# Patient Record
Sex: Female | Born: 1940
Health system: Southern US, Community
[De-identification: ages and names within clinical notes are randomized; demographics above are authoritative.]

## PROBLEM LIST (undated history)

## (undated) ENCOUNTER — Emergency Department (HOSPITAL_COMMUNITY): Admission: EM | Payer: Self-pay | Source: Home / Self Care

## (undated) DIAGNOSIS — E119 Type 2 diabetes mellitus without complications: Secondary | ICD-10-CM

## (undated) DIAGNOSIS — I340 Nonrheumatic mitral (valve) insufficiency: Secondary | ICD-10-CM

## (undated) DIAGNOSIS — I10 Essential (primary) hypertension: Secondary | ICD-10-CM

## (undated) DIAGNOSIS — I639 Cerebral infarction, unspecified: Secondary | ICD-10-CM

## (undated) HISTORY — PX: BLADDER SURGERY: SHX569

## (undated) HISTORY — PX: MOLE REMOVAL: SHX2046

## (undated) HISTORY — PX: OTHER SURGICAL HISTORY: SHX169

---

## 1998-03-31 ENCOUNTER — Emergency Department (HOSPITAL_COMMUNITY): Admission: EM | Admit: 1998-03-31 | Discharge: 1998-03-31 | Payer: Self-pay

## 1998-09-06 ENCOUNTER — Encounter: Payer: Self-pay | Admitting: Emergency Medicine

## 1998-09-06 ENCOUNTER — Emergency Department (HOSPITAL_COMMUNITY): Admission: EM | Admit: 1998-09-06 | Discharge: 1998-09-06 | Payer: Self-pay | Admitting: Emergency Medicine

## 2002-03-11 ENCOUNTER — Emergency Department (HOSPITAL_COMMUNITY): Admission: EM | Admit: 2002-03-11 | Discharge: 2002-03-11 | Payer: Self-pay | Admitting: Emergency Medicine

## 2002-03-11 ENCOUNTER — Encounter: Payer: Self-pay | Admitting: Emergency Medicine

## 2002-05-06 ENCOUNTER — Encounter: Payer: Self-pay | Admitting: Emergency Medicine

## 2002-05-06 ENCOUNTER — Emergency Department (HOSPITAL_COMMUNITY): Admission: EM | Admit: 2002-05-06 | Discharge: 2002-05-06 | Payer: Self-pay | Admitting: *Deleted

## 2010-06-27 ENCOUNTER — Emergency Department (HOSPITAL_COMMUNITY): Admission: EM | Admit: 2010-06-27 | Discharge: 2010-06-27 | Payer: Self-pay | Admitting: Family Medicine

## 2010-10-31 LAB — POCT URINALYSIS DIPSTICK
Bilirubin Urine: NEGATIVE
Glucose, UA: NEGATIVE mg/dL
Hgb urine dipstick: NEGATIVE
Ketones, ur: NEGATIVE mg/dL
Nitrite: NEGATIVE
Protein, ur: NEGATIVE mg/dL
Specific Gravity, Urine: 1.01 (ref 1.005–1.030)
Urobilinogen, UA: 0.2 mg/dL (ref 0.0–1.0)
pH: 5 (ref 5.0–8.0)

## 2011-02-08 ENCOUNTER — Emergency Department (HOSPITAL_COMMUNITY)
Admission: EM | Admit: 2011-02-08 | Discharge: 2011-02-08 | Disposition: A | Discharge status: Home / Self Care | Payer: Medicare Other | Attending: Emergency Medicine | Admitting: Emergency Medicine

## 2011-02-08 DIAGNOSIS — R51 Headache: Secondary | ICD-10-CM | POA: Insufficient documentation

## 2011-02-08 DIAGNOSIS — M25429 Effusion, unspecified elbow: Secondary | ICD-10-CM | POA: Insufficient documentation

## 2011-02-08 DIAGNOSIS — T63001A Toxic effect of unspecified snake venom, accidental (unintentional), initial encounter: Secondary | ICD-10-CM | POA: Insufficient documentation

## 2011-02-08 DIAGNOSIS — I10 Essential (primary) hypertension: Secondary | ICD-10-CM | POA: Insufficient documentation

## 2011-02-08 DIAGNOSIS — I252 Old myocardial infarction: Secondary | ICD-10-CM | POA: Insufficient documentation

## 2011-02-08 DIAGNOSIS — E119 Type 2 diabetes mellitus without complications: Secondary | ICD-10-CM | POA: Insufficient documentation

## 2011-02-08 DIAGNOSIS — R350 Frequency of micturition: Secondary | ICD-10-CM | POA: Insufficient documentation

## 2011-02-08 DIAGNOSIS — T6391XA Toxic effect of contact with unspecified venomous animal, accidental (unintentional), initial encounter: Secondary | ICD-10-CM | POA: Insufficient documentation

## 2011-02-08 LAB — POCT I-STAT, CHEM 8
BUN: 14 mg/dL (ref 6–23)
Calcium, Ion: 1.17 mmol/L (ref 1.12–1.32)
Chloride: 107 mEq/L (ref 96–112)
Creatinine, Ser: 1.1 mg/dL (ref 0.50–1.10)
Glucose, Bld: 155 mg/dL — ABNORMAL HIGH (ref 70–99)
HCT: 44 % (ref 36.0–46.0)
Hemoglobin: 15 g/dL (ref 12.0–15.0)
Potassium: 3.6 mEq/L (ref 3.5–5.1)
Sodium: 138 mEq/L (ref 135–145)
TCO2: 24 mmol/L (ref 0–100)

## 2011-02-08 LAB — URINALYSIS, ROUTINE W REFLEX MICROSCOPIC
Glucose, UA: NEGATIVE mg/dL
Leukocytes, UA: NEGATIVE
pH: 6 (ref 5.0–8.0)

## 2012-08-17 ENCOUNTER — Other Ambulatory Visit (HOSPITAL_COMMUNITY)
Admission: RE | Admit: 2012-08-17 | Discharge: 2012-08-17 | Disposition: A | Discharge status: Home / Self Care | Payer: Medicare Other | Source: Ambulatory Visit | Attending: Emergency Medicine | Admitting: Emergency Medicine

## 2012-08-17 ENCOUNTER — Emergency Department (INDEPENDENT_AMBULATORY_CARE_PROVIDER_SITE_OTHER): Payer: Medicare Other

## 2012-08-17 ENCOUNTER — Encounter (HOSPITAL_COMMUNITY): Payer: Self-pay | Admitting: *Deleted

## 2012-08-17 ENCOUNTER — Emergency Department (INDEPENDENT_AMBULATORY_CARE_PROVIDER_SITE_OTHER)
Admission: EM | Admit: 2012-08-17 | Discharge: 2012-08-17 | Disposition: A | Discharge status: Home / Self Care | Payer: Medicare Other | Source: Home / Self Care | Attending: Emergency Medicine | Admitting: Emergency Medicine

## 2012-08-17 DIAGNOSIS — N760 Acute vaginitis: Secondary | ICD-10-CM | POA: Insufficient documentation

## 2012-08-17 DIAGNOSIS — J209 Acute bronchitis, unspecified: Secondary | ICD-10-CM

## 2012-08-17 DIAGNOSIS — E119 Type 2 diabetes mellitus without complications: Secondary | ICD-10-CM

## 2012-08-17 DIAGNOSIS — N390 Urinary tract infection, site not specified: Secondary | ICD-10-CM

## 2012-08-17 DIAGNOSIS — Z113 Encounter for screening for infections with a predominantly sexual mode of transmission: Secondary | ICD-10-CM | POA: Insufficient documentation

## 2012-08-17 HISTORY — DX: Essential (primary) hypertension: I10

## 2012-08-17 HISTORY — DX: Type 2 diabetes mellitus without complications: E11.9

## 2012-08-17 LAB — POCT I-STAT, CHEM 8
Calcium, Ion: 1.11 mmol/L — ABNORMAL LOW (ref 1.13–1.30)
Chloride: 104 mEq/L (ref 96–112)
Creatinine, Ser: 1.2 mg/dL — ABNORMAL HIGH (ref 0.50–1.10)
Glucose, Bld: 184 mg/dL — ABNORMAL HIGH (ref 70–99)
HCT: 44 % (ref 36.0–46.0)

## 2012-08-17 LAB — POCT URINALYSIS DIP (DEVICE)
Bilirubin Urine: NEGATIVE
Nitrite: NEGATIVE
Urobilinogen, UA: 0.2 mg/dL (ref 0.0–1.0)
pH: 5.5 (ref 5.0–8.0)

## 2012-08-17 MED ORDER — METFORMIN HCL 500 MG PO TABS: 500.0000 mg | ORAL_TABLET | Freq: Every day | ORAL | Status: DC

## 2012-08-17 MED ORDER — BENZONATATE 200 MG PO CAPS: 200.0000 mg | ORAL_CAPSULE | Freq: Three times a day (TID) | ORAL | Status: DC | PRN

## 2012-08-17 MED ORDER — CEPHALEXIN 500 MG PO CAPS: 500.0000 mg | ORAL_CAPSULE | Freq: Three times a day (TID) | ORAL | Status: DC

## 2012-08-17 MED ORDER — METRONIDAZOLE 500 MG PO TABS: 500.0000 mg | ORAL_TABLET | Freq: Two times a day (BID) | ORAL | Status: DC

## 2012-08-17 NOTE — ED Provider Notes (Signed)
Chief Complaint  Patient presents with  . Cough    History of Present Illness:   Donna Nguyen is a 71 year old female who has no primary care provider. She comes in today with a number of problems including the following: Vaginal odor, chest discomfort, fever, cough, back pain, some foot problems, and urinary frequency and stress incontinence. Her vaginal symptoms when going on about 3 or 4 weeks. She denies any discharge. She has had some odor. She's had some lower abdominal pain. She denies any bleeding. The chest pain has been going on since this past Monday, one week. She feels a tight pressure in the substernal area. This is constant and worse if she takes a deep breath. She states she had an heart attack in 2009 but has not been back to see a cardiologist since then. Also for the past week she's had temperatures up to 101 to 102. She has difficulty breathing and rattling in her chest. She's had a dry, nonproductive cough, sore throat, ear congestion, and headache. She's also had pain in her lower back. She cut her right foot about a week ago and wonders whether it's healing up okay. She also has a crack at the base of the left little toe for about 3 years. She's been putting Vaseline on this and she thinks it might be healing up well. Since she's had a cough she's had some stress incontinence and urinary frequency. She has a history of diabetes, hypertension, and hyperlipidemia but is not taking any medicine for any of these right now. She denies any diabetic symptoms including polyuria, polydipsia, or blurry vision.  Review of Systems:  Other than noted above, the patient denies any of the following symptoms. Systemic:  No fever, chills, sweats, fatigue, myalgias, headache, or anorexia. Eye:  No redness, pain or drainage. ENT:  No earache, nasal congestion, rhinorrhea, sinus pressure, or sore throat. Lungs:  No cough, sputum production, wheezing, shortness of breath.  Cardiovascular:  No chest  pain, palpitations, or syncope. GI:  No nausea, vomiting, abdominal pain or diarrhea. GU:  No dysuria, frequency, or hematuria. Skin:  No rash or pruritis.  PMFSH:  Past medical history, family history, social history, meds, and allergies were reviewed.   Physical Exam:   Vital signs:  BP 120/80  Pulse 96  Temp 98.4 F (36.9 C) (Oral)  Resp 20  SpO2 97% General:  Alert, in no distress. Eye:  PERRL, full EOMs.  Lids and conjunctivas were normal. ENT:  TMs and canals were normal, without erythema or inflammation.  Nasal mucosa was clear and uncongested, without drainage.  Mucous membranes were moist.  Pharynx was clear, without exudate or drainage.  There were no oral ulcerations or lesions. Neck:  Supple, no adenopathy, tenderness or mass. Thyroid was normal. Lungs:  No respiratory distress.  Lungs were clear to auscultation, without wheezes, rales or rhonchi.  Breath sounds were clear and equal bilaterally. Heart:  Regular rhythm, without gallops, murmers or rubs. Abdomen:  Soft, flat, and non-tender to palpation.  No hepatosplenomagaly or mass. Pelvic exam: Normal external genitalia. Vaginal mucosa was atrophic. There was no discharge or bleeding. Cervix was normal. Uterus was small atrophic. There were no adnexal masses or tenderness. Skin:  Clear, warm, and dry, without rash or lesions.  Labs:   Results for orders placed during the hospital encounter of 08/17/12  POCT URINALYSIS DIP (DEVICE)      Component Value Range   Glucose, UA 500 (*) NEGATIVE mg/dL   Bilirubin  Urine NEGATIVE  NEGATIVE   Ketones, ur NEGATIVE  NEGATIVE mg/dL   Specific Gravity, Urine 1.020  1.005 - 1.030   Hgb urine dipstick SMALL (*) NEGATIVE   pH 5.5  5.0 - 8.0   Protein, ur NEGATIVE  NEGATIVE mg/dL   Urobilinogen, UA 0.2  0.0 - 1.0 mg/dL   Nitrite NEGATIVE  NEGATIVE   Leukocytes, UA NEGATIVE  NEGATIVE  POCT I-STAT, CHEM 8      Component Value Range   Sodium 139  135 - 145 mEq/L   Potassium 3.6  3.5  - 5.1 mEq/L   Chloride 104  96 - 112 mEq/L   BUN 14  6 - 23 mg/dL   Creatinine, Ser 1.61 (*) 0.50 - 1.10 mg/dL   Glucose, Bld 096 (*) 70 - 99 mg/dL   Calcium, Ion 0.45 (*) 1.13 - 1.30 mmol/L   TCO2 27  0 - 100 mmol/L   Hemoglobin 15.0  12.0 - 15.0 g/dL   HCT 40.9  81.1 - 91.4 %     Radiology:  Dg Chest 2 View  08/17/2012  *RADIOLOGY REPORT*  Clinical Data: Cough, fever.  CHEST - 2 VIEW  Comparison: None.  Findings: Mild hyperinflation of the lungs.  Minimal lingular density, likely scar.  Heart is upper limits normal in size. Mediastinal contours are within normal limits.  No effusions.  No acute bony abnormality.  IMPRESSION: COPD/chronic changes.  No acute findings.   Original Report Authenticated By: Charlett Nose, M.D.     Date: 08/17/2012  Rate: 103  Rhythm: sinus tachycardia  QRS Axis: normal  Intervals: normal  ST/T Wave abnormalities: normal  Conduction Disutrbances:none  Narrative Interpretation: Sinus tachycardia, otherwise normal EKG.  Old EKG Reviewed: none available  Other Labs Obtained at Urgent Care Center:  DNA probes for gonorrhea, Chlamydia, Trichomonas, candida, and Gardnerella were obtained. Also a urine culture was obtained.  Results are pending at this time and we will call about any positive results.   Assessment:  The primary encounter diagnosis was Vaginitis. Diagnoses of Acute bronchitis, Type 2 diabetes mellitus, and UTI (lower urinary tract infection) were also pertinent to this visit.  The vaginitis could be atrophic vaginitis or bacterial vaginosis. The chest discomfort appears to be due to cough and noncardiac in nature. She may have urinary tract infection. Results of her culture are pending. Her blood sugar was elevated and I went ahead and start her on some metronidazole, but she was strongly encouraged to find a primary care physician and a cardiologist and followup as soon as possible with both.  Plan:   1.  The following meds were prescribed:   New  Prescriptions   BENZONATATE (TESSALON) 200 MG CAPSULE    Take 1 capsule (200 mg total) by mouth 3 (three) times daily as needed for cough.   CEPHALEXIN (KEFLEX) 500 MG CAPSULE    Take 1 capsule (500 mg total) by mouth 3 (three) times daily.   METFORMIN (GLUCOPHAGE) 500 MG TABLET    Take 1 tablet (500 mg total) by mouth daily with breakfast.   METRONIDAZOLE (FLAGYL) 500 MG TABLET    Take 1 tablet (500 mg total) by mouth 2 (two) times daily.   2.  The patient was instructed in symptomatic care and handouts were given. 3.  The patient was told to return if becoming worse in any way, if no better in 3 or 4 days, and given some red flag symptoms that would indicate earlier return.    Onalee Hua  Vivia Budge, MD 08/17/12 2002

## 2012-08-17 NOTE — ED Notes (Signed)
Pt  Has  Multiple  Complaints    She  Reports  She  Has  A   Non  Productive  Cough       With  Headache         As  Well   As  Loss of  Bladder  Control  As  Well           She  Reports    Had  Vaginal irritation  Recently  From  Tight    Jeans       She  Reports  frequency  Of  Urination as  Well

## 2012-08-18 LAB — URINE CULTURE: Colony Count: NO GROWTH

## 2012-08-22 NOTE — ED Notes (Signed)
GC/Chlamydia neg., Affirm: all neg. Urine culture: No growth.

## 2012-11-07 ENCOUNTER — Emergency Department (INDEPENDENT_AMBULATORY_CARE_PROVIDER_SITE_OTHER)
Admission: EM | Admit: 2012-11-07 | Discharge: 2012-11-07 | Disposition: A | Discharge status: Home / Self Care | Payer: Medicare Other | Source: Home / Self Care | Attending: Family Medicine | Admitting: Family Medicine

## 2012-11-07 ENCOUNTER — Encounter (HOSPITAL_COMMUNITY): Payer: Self-pay | Admitting: Emergency Medicine

## 2012-11-07 DIAGNOSIS — W010XXA Fall on same level from slipping, tripping and stumbling without subsequent striking against object, initial encounter: Secondary | ICD-10-CM

## 2012-11-07 DIAGNOSIS — S0081XA Abrasion of other part of head, initial encounter: Secondary | ICD-10-CM

## 2012-11-07 DIAGNOSIS — IMO0002 Reserved for concepts with insufficient information to code with codable children: Secondary | ICD-10-CM

## 2012-11-07 DIAGNOSIS — S39012A Strain of muscle, fascia and tendon of lower back, initial encounter: Secondary | ICD-10-CM

## 2012-11-07 DIAGNOSIS — S335XXA Sprain of ligaments of lumbar spine, initial encounter: Secondary | ICD-10-CM

## 2012-11-07 DIAGNOSIS — E119 Type 2 diabetes mellitus without complications: Secondary | ICD-10-CM

## 2012-11-07 MED ORDER — ACETAMINOPHEN 650 MG PO TABS: 1.0000 | ORAL_TABLET | Freq: Three times a day (TID) | ORAL | Status: DC | PRN

## 2012-11-07 MED ORDER — CYCLOBENZAPRINE HCL 10 MG PO TABS
10.0000 mg | ORAL_TABLET | Freq: Every evening | ORAL | Status: DC | PRN
Start: 2012-11-07 — End: 2015-03-14

## 2012-11-07 MED ORDER — FLUTICASONE PROPIONATE 50 MCG/ACT NA SUSP: 2.0000 | Freq: Every day | NASAL | Status: DC

## 2012-11-07 NOTE — ED Notes (Signed)
Instructions not available 

## 2012-11-07 NOTE — ED Notes (Signed)
Received instructions 

## 2012-11-07 NOTE — ED Notes (Signed)
Patient sitting in chair.  Provided warm blankets.  Continues to keep ice pack on right ankle, scrapes to bridge of nose and right lower leg. Patient is appropriate, talkative, answering questions appropriately.  Reports head and nose was the most painful initially, now ankle is quite painful.  No loc.  Remembers falling.  Walking on stepping stones, stepping stone tilted throwing patient off balance.  Twisted ankle, fell to knee and then hit head and face.

## 2012-11-07 NOTE — ED Notes (Addendum)
Pt c/o of fall this morning. Was walking dog and tripped over a stepping stone. Twisted ankle and fell on knee then hit head. Right side is sore and back hurts. Abrasions on right knee and shin, also on bridge of nose. Has no dizziness but does feel nauseous. Drinking a milkshake for nausea with mild relief. Felt like nose was "tightening". Curently using two ice packs for back and nose with relief. Pt is alert and slightly disoriented. Pt drove herself to facility today. Took Aleeve and Prilosec this morning.

## 2012-11-07 NOTE — ED Provider Notes (Addendum)
History     CSN: [id number]  Arrival date & time 11/07/12  1150   First MD Initiated Contact with Patient 11/07/12 1219      Chief Complaint  Patient presents with  . Fall    (Consider location/radiation/quality/duration/timing/severity/associated sxs/prior treatment) HPI Comments: 72 year old female with history of hypertension and diabetes. Here concerned after she had a fall at 10 AM this morning (over 4 hours ago). Patient reports that she was walking her dog outside of her house and she tripped over a stepping stone falling forward she was able to more ties her fall with her upper extremities but eventually keep her forehead and nose against the ground. She has an abrasion at the bridge of her nose, no bruising. Patient reports she initially had a headache but is almost completely gone now. Denies blurry vision palpitations, seizures or altered mentation before or after her fall. She denies current problems with gait or balance. No nausea or vomiting. Patient does report pain in her lower back as she thinks she over stretched her back during her fall. Patient also wanted to have address a chronic symptoms of nasal congestion, sneezing and clear rhinorrhea for months. Chills reports nonproductive cough with his symptoms. Denies fevers chills. No sore throat. No wheezing here no prior or current history of smoking. Patient stated she is diabetic and hypertensive she is not currently taking medications. She does not like doctors Chaney Malling does not like to take pills. States she has been well controlled on diet and exercise and she checks her sugar daily at home usually below 200 randomly.    Past Medical History  Diagnosis Date  . Diabetes mellitus without complication   . Hypertension     Past Surgical History  Procedure Laterality Date  . Bladdee    . Bladder surgery    . Mole removal      No family history on file.  History  Substance Use Topics  . Smoking status: Never Smoker    . Smokeless tobacco: Not on file  . Alcohol Use: Yes     Comment: socially    OB History   Grav Para Term Preterm Abortions TAB SAB Ect Mult Living                  Review of Systems  Constitutional: Negative for activity change and appetite change.  HENT: Positive for congestion and sneezing. Negative for nosebleeds, facial swelling, neck stiffness and ear discharge.   Eyes: Negative for visual disturbance.  Musculoskeletal: Positive for back pain. Negative for joint swelling and gait problem.  Skin: Negative for wound.       Nose abrasion as per HPI  Neurological: Negative for dizziness, tremors, seizures, syncope, weakness, light-headedness, numbness and headaches.  All other systems reviewed and are negative.    Allergies  Amitriptyline; Ciprofloxacin; and Epinephrine  Home Medications   Current Outpatient Rx  Name  Route  Sig  Dispense  Refill  . Naproxen Sodium (ALEVE PO)   Oral   Take by mouth.         . Omeprazole (PRILOSEC PO)   Oral   Take by mouth.         . Acetaminophen 650 MG TABS   Oral   Take 1 tablet (650 mg total) by mouth 3 (three) times daily as needed.   30 tablet   0   . benzonatate (TESSALON) 200 MG capsule   Oral   Take 1 capsule (200 mg total) by mouth  3 (three) times daily as needed for cough.   30 capsule   0   . cyclobenzaprine (FLEXERIL) 10 MG tablet   Oral   Take 1 tablet (10 mg total) by mouth at bedtime as needed for muscle spasms.   15 tablet   0   . fluticasone (FLONASE) 50 MCG/ACT nasal spray   Nasal   Place 2 sprays into the nose daily.   16 g   0   . metFORMIN (GLUCOPHAGE) 500 MG tablet   Oral   Take 1 tablet (500 mg total) by mouth daily with breakfast.   30 tablet   2     BP 155/78  Pulse 76  Temp(Src) 97.9 F (36.6 C) (Oral)  SpO2 95%  Physical Exam  Nursing note and vitals reviewed. Constitutional: She is oriented to person, place, and time. She appears well-developed and well-nourished. No  distress.  HENT:  Head: Normocephalic.  Right Ear: External ear normal.  Left Ear: External ear normal.  Mouth/Throat: No oropharyngeal exudate.  No head contusion, bruising, ecchymosis, hematoma or lacerations.  There is a vertical superficial abrasion over nasal bridge with no associated swelling, hematoma or bleeding.  There is swelling of nasal turbinates with clear rhinorrhea.   Eyes: Conjunctivae and EOM are normal. Pupils are equal, round, and reactive to light.  Neck: Normal range of motion. Neck supple.  Cardiovascular: Normal rate, regular rhythm and normal heart sounds.   Pulmonary/Chest: Effort normal and breath sounds normal.  Musculoskeletal:  Central spine with no scoliosis or kyphosis. Fair anterior flexion and posterior extension. Patient able to walk on tip toes and heels with no difficulty foot drop or pain exacerbation. No bone prominence tenderness. Tenderness to palpation in bilateral lumbar paravertebral muscles.  Negative straight leg test bilateral. Intact sensation and symmetric + DTRs (rotullian and achillean) in low extremities.   Neurological: She is alert and oriented to person, place, and time. She has normal strength and normal reflexes. No cranial nerve deficit or sensory deficit. She displays a negative Romberg sign. Coordination and gait normal. GCS eye subscore is 4. GCS verbal subscore is 5. GCS motor subscore is 6.  No face drop. No arm drop.  Visual fields normal by comparison.  Skin: She is not diaphoretic.  No bruising , ecchymosis, hematomas or lacerations.    ED Course  Procedures (including critical care time)  Labs Reviewed - No data to display No results found.   1. Low back strain, initial encounter   2. Facial abrasion, initial encounter   3. Fall from slipping on slippery surface, initial encounter       MDM  No history or clinical findings concerning for concussion or intracranial injury. Provided antibiotic ointment to  treat superficial abrasion over nasal bridge. Prescribed acetaminophen and bedtime Flexeril to take as needed for pain and muscle spasm. Chronic rhinitis treated with Flonase. Recommended patient to establish care with a primary care provider to monitor her symptoms and chronic conditions. She appeared Contemplative. A list of primary care provider in our area was given prior to discharge. Red flags that should prompt her return to medical attention discussed with patient and provided in writing.        Sharin Grave, MD 11/08/12 1342  Sharin Grave, MD 11/08/12 1359

## 2013-09-12 ENCOUNTER — Encounter (HOSPITAL_COMMUNITY): Payer: Self-pay | Admitting: Emergency Medicine

## 2013-09-12 ENCOUNTER — Emergency Department (HOSPITAL_COMMUNITY)
Admission: EM | Admit: 2013-09-12 | Discharge: 2013-09-12 | Disposition: A | Discharge status: Home / Self Care | Payer: Medicare Other | Source: Home / Self Care | Attending: Emergency Medicine | Admitting: Emergency Medicine

## 2013-09-12 ENCOUNTER — Emergency Department (INDEPENDENT_AMBULATORY_CARE_PROVIDER_SITE_OTHER): Payer: Medicare Other

## 2013-09-12 DIAGNOSIS — J209 Acute bronchitis, unspecified: Secondary | ICD-10-CM

## 2013-09-12 MED ORDER — AZITHROMYCIN 250 MG PO TABS: ORAL_TABLET | ORAL | Status: DC

## 2013-09-12 MED ORDER — ALBUTEROL SULFATE HFA 108 (90 BASE) MCG/ACT IN AERS: 1.0000 | INHALATION_SPRAY | Freq: Four times a day (QID) | RESPIRATORY_TRACT | Status: DC | PRN

## 2013-09-12 MED ORDER — GUAIFENESIN-CODEINE 100-10 MG/5ML PO SYRP: 10.0000 mL | ORAL_SOLUTION | Freq: Four times a day (QID) | ORAL | Status: DC | PRN

## 2013-09-12 NOTE — ED Notes (Signed)
Pt c/o cold sxs onset Monday  Sxs include: cough, chest rattling, fevers, congestion Denies: v/n/d, SOB, wheezing... Taking "hot toddie" w/no relief Alert w/no signs of acute distress.

## 2013-09-12 NOTE — ED Provider Notes (Signed)
Chief Complaint   Chief Complaint  Patient presents with  . URI    History of Present Illness   Donna Nguyen is a 73 year old female who has had a one-week history of dry cough, rattling in her chest, chest pain, subjective fever, sweats, chills, nasal congestion with clear drainage, congested ears, and sore throat. She denies any wheezing, chest tightness, headache, or GI symptoms. She's had no specific sick exposures.  Review of Systems   Other than as noted above, the patient denies any of the following symptoms: Systemic:  No fevers, chills, sweats, or myalgias. Eye:  No redness or discharge. ENT:  No ear pain, headache, nasal congestion, drainage, sinus pressure, or sore throat. Neck:  No neck pain, stiffness, or swollen glands. Lungs:  No cough, sputum production, hemoptysis, wheezing, chest tightness, shortness of breath or chest pain. GI:  No abdominal pain, nausea, vomiting or diarrhea.  [place]   Past medical history, family history, social history, meds, and allergies were reviewed. She is allergic to amitriptyline. She takes Prilosec and Aleve. She has diabetes, coronary artery disease, history of an MI, and hypertension. She's not on any medications for these problems.  Physical exam   Vital signs:  BP 171/81  Pulse 105  Temp(Src) 98.4 F (36.9 C) (Oral)  Resp 20  SpO2 96% General:  Alert and oriented.  In no distress.  Skin warm and dry. Eye:  No conjunctival injection or drainage. Lids were normal. ENT:  TMs and canals were normal, without erythema or inflammation.  Nasal mucosa was clear and uncongested, without drainage.  Mucous membranes were moist.  Pharynx was clear with no exudate or drainage.  There were no oral ulcerations or lesions. Neck:  Supple, no adenopathy, tenderness or mass. Lungs:  No respiratory distress.  Lungs were clear to auscultation, without wheezes, rales or rhonchi.  Breath sounds were clear and equal bilaterally.  Heart:  Regular  rhythm, without gallops, murmers or rubs. Skin:  Clear, warm, and dry, without rash or lesions.  Radiology   Dg Chest 2 View  09/12/2013   CLINICAL DATA:  Cough and fever  EXAM: CHEST  2 VIEW  COMPARISON:  08/17/2012  FINDINGS: Upper normal heart size. Clear lungs. No pneumothorax or pleural effusion.  IMPRESSION: No active cardiopulmonary disease.   Electronically Signed   By: Maryclare Bean M.D.   On: 09/12/2013 10:12   Assessment     The encounter diagnosis was Acute bronchitis.  Plan    1.  Meds:  The following meds were prescribed:   New Prescriptions   ALBUTEROL (PROVENTIL HFA;VENTOLIN HFA) 108 (90 BASE) MCG/ACT INHALER    Inhale 1-2 puffs into the lungs every 6 (six) hours as needed for wheezing or shortness of breath.   AZITHROMYCIN (ZITHROMAX Z-PAK) 250 MG TABLET    Take as directed.   GUAIFENESIN-CODEINE (GUIATUSS AC) 100-10 MG/5ML SYRUP    Take 10 mLs by mouth 4 (four) times daily as needed for cough.    2.  Patient Education/Counseling:  The patient was given appropriate handouts, self care instructions, and instructed in symptomatic relief.  Instructed to get extra fluids, rest, and use a cool mist vaporizer.    3.  Follow up:  The patient was told to follow up here if no better in 3 to 4 days, or sooner if becoming worse in any way, and given some red flag symptoms such as increasing fever, difficulty breathing, chest pain, or persistent vomiting which would prompt immediate return.  Follow  up here as needed.      Harden Mo, MD 09/12/13 1034

## 2013-09-12 NOTE — Discharge Instructions (Signed)

## 2013-09-19 ENCOUNTER — Emergency Department (INDEPENDENT_AMBULATORY_CARE_PROVIDER_SITE_OTHER)
Admission: EM | Admit: 2013-09-19 | Discharge: 2013-09-19 | Disposition: A | Discharge status: Home / Self Care | Payer: Medicare Other | Source: Home / Self Care | Attending: Emergency Medicine | Admitting: Emergency Medicine

## 2013-09-19 ENCOUNTER — Encounter (HOSPITAL_COMMUNITY): Payer: Self-pay | Admitting: Emergency Medicine

## 2013-09-19 ENCOUNTER — Emergency Department (INDEPENDENT_AMBULATORY_CARE_PROVIDER_SITE_OTHER): Payer: Medicare Other

## 2013-09-19 DIAGNOSIS — J45909 Unspecified asthma, uncomplicated: Secondary | ICD-10-CM

## 2013-09-19 MED ORDER — PREDNISONE 20 MG PO TABS: ORAL_TABLET | ORAL | Status: DC

## 2013-09-19 MED ORDER — PREDNISONE 20 MG PO TABS
ORAL_TABLET | ORAL | Status: AC
  Filled 2013-09-19: qty 3

## 2013-09-19 MED ORDER — IPRATROPIUM-ALBUTEROL 0.5-2.5 (3) MG/3ML IN SOLN
3.0000 mL | RESPIRATORY_TRACT | Status: DC
  Administered 2013-09-19 (×2): 3 mL via RESPIRATORY_TRACT

## 2013-09-19 MED ORDER — HYDROCOD POLST-CHLORPHEN POLST 10-8 MG/5ML PO LQCR: 5.0000 mL | Freq: Two times a day (BID) | ORAL | Status: DC | PRN

## 2013-09-19 MED ORDER — IPRATROPIUM-ALBUTEROL 0.5-2.5 (3) MG/3ML IN SOLN
RESPIRATORY_TRACT | Status: AC
  Filled 2013-09-19: qty 3

## 2013-09-19 MED ORDER — PREDNISONE 20 MG PO TABS
60.0000 mg | ORAL_TABLET | Freq: Once | ORAL | Status: AC
  Administered 2013-09-19: 60 mg via ORAL

## 2013-09-19 MED ORDER — DOXYCYCLINE HYCLATE 100 MG PO CAPS: 100.0000 mg | ORAL_CAPSULE | Freq: Two times a day (BID) | ORAL | Status: DC

## 2013-09-19 NOTE — Discharge Instructions (Signed)
Use inhaler 2 puffs 4 times daily.

## 2013-09-19 NOTE — ED Provider Notes (Signed)
Chief Complaint   Chief Complaint  Patient presents with  . Cough    History of Present Illness   Donna Nguyen is a 73 year old female who was here a week ago with a one-week history of rattly and productive cough and nasal congestion. Her x-ray at that time was negative but she did have some expiratory wheezes. She was treated with azithromycin, guaifenesin/codeine cough syrup, and albuterol. She's been using the meds throughout the past week, although has only been using the albuterol about twice a day. She feels no better or no worse today. She feels like she has not made much progress. She continues to have a rattly cough productive of small amounts of yellow sputum, aching in her chest, nasal congestion with yellow drainage, ear congestion, and sore throat. She denies any fever, chills, GI symptoms, or difficulty breathing. She has no history of asthma.  Review of Systems   Other than as noted above, the patient denies any of the following symptoms: Systemic:  No fevers, chills, sweats, or myalgias. Eye:  No redness or discharge. ENT:  No ear pain, headache, nasal congestion, drainage, sinus pressure, or sore throat. Neck:  No neck pain, stiffness, or swollen glands. Lungs:  No cough, sputum production, hemoptysis, wheezing, chest tightness, shortness of breath or chest pain. GI:  No abdominal pain, nausea, vomiting or diarrhea.  [place]   Past medical history, family history, social history, meds, and allergies were reviewed.   Physical exam   Vital signs:  BP 162/80  Pulse 101  Temp(Src) 98.7 F (37.1 C) (Oral)  Resp 20  SpO2 95% General:  Alert and oriented.  In no distress.  Skin warm and dry. Eye:  No conjunctival injection or drainage. Lids were normal. ENT:  TMs and canals were normal, without erythema or inflammation.  Nasal mucosa was clear and uncongested, without drainage.  Mucous membranes were moist.  Pharynx was clear with no exudate or drainage.  There were no  oral ulcerations or lesions. Neck:  Supple, no adenopathy, tenderness or mass. Lungs:  No respiratory distress.  She has bilateral inspiratory and expiratory wheezes, rales, and rhonchi, good air movement bilaterally.  Heart:  Regular rhythm, without gallops, murmers or rubs. Skin:  Clear, warm, and dry, without rash or lesions.  Radiology   Dg Chest 2 View  09/19/2013   CLINICAL DATA:  Two week history of cough.  EXAM: CHEST  2 VIEW  COMPARISON:  DG CHEST 2 VIEW dated 09/12/2013; DG CHEST 2 VIEW dated 08/17/2012  FINDINGS: Cardiomediastinal silhouette unremarkable and unchanged. Minimal linear scarring in the left lower lobe, unchanged. Lungs otherwise clear. No localized airspace consolidation. Bronchovascular markings normal. No pleural effusions. No pneumothorax. Normal pulmonary vascularity. Mild degenerative changes involving the mid thoracic spine. No significant interval change.  IMPRESSION: Minimal linear scarring in the left lower lobe. No acute cardiopulmonary disease. Stable examination.   Electronically Signed   By: Evangeline Dakin M.D.   On: 09/19/2013 15:24   Course in [facility]   She was given a DuoNeb breathing treatment as well as prednisone 60 mg by mouth. She felt quite a bit better after the DuoNeb treatments.  Assessment     The encounter diagnosis was Asthmatic bronchitis.  Plan    1.  Meds:  The following meds were prescribed:   Discharge Medication List as of 09/19/2013  3:41 PM    START taking these medications   Details  chlorpheniramine-HYDROcodone (TUSSIONEX) 10-8 MG/5ML LQCR Take 5 mLs by  mouth every 12 (twelve) hours as needed for cough., Starting 09/19/2013, Until Discontinued, Normal    doxycycline (VIBRAMYCIN) 100 MG capsule Take 1 capsule (100 mg total) by mouth 2 (two) times daily., Starting 09/19/2013, Until Discontinued, Normal    predniSONE (DELTASONE) 20 MG tablet 3 daily for 5 days,  2 daily for 5 days, 1 daily for 5 days, Normal         2.  Patient Education/Counseling:  The patient was given appropriate handouts, self care instructions, and instructed in symptomatic relief.  Instructed to get extra fluids, rest, and use a cool mist vaporizer.    3.  Follow up:  The patient was told to follow up here if no better in 3 to 4 days, or sooner if becoming worse in any way, and given some red flag symptoms such as increasing fever, difficulty breathing, chest pain, or persistent vomiting which would prompt immediate return.  Follow up here as needed.      Harden Mo, MD 09/19/13 1600

## 2013-09-19 NOTE — ED Notes (Signed)
C/o bronchitis  States this is a follow up States she does have nasal congestion and still has a cough She is still taking antibiotics

## 2013-09-21 MED ORDER — GUAIFENESIN-CODEINE 100-10 MG/5ML PO SYRP: 10.0000 mL | ORAL_SOLUTION | Freq: Four times a day (QID) | ORAL | Status: DC | PRN

## 2013-11-22 ENCOUNTER — Encounter (HOSPITAL_COMMUNITY): Payer: Self-pay | Admitting: Emergency Medicine

## 2013-11-22 ENCOUNTER — Emergency Department (HOSPITAL_COMMUNITY)
Admission: EM | Admit: 2013-11-22 | Discharge: 2013-11-22 | Disposition: A | Discharge status: Home / Self Care | Payer: Medicare Other | Source: Home / Self Care | Attending: Emergency Medicine | Admitting: Emergency Medicine

## 2013-11-22 DIAGNOSIS — M2550 Pain in unspecified joint: Secondary | ICD-10-CM

## 2013-11-22 DIAGNOSIS — F4389 Other reactions to severe stress: Secondary | ICD-10-CM

## 2013-11-22 DIAGNOSIS — F4329 Adjustment disorder with other symptoms: Secondary | ICD-10-CM

## 2013-11-22 DIAGNOSIS — F438 Other reactions to severe stress: Secondary | ICD-10-CM

## 2013-11-22 LAB — COMPREHENSIVE METABOLIC PANEL
ALBUMIN: 3.7 g/dL (ref 3.5–5.2)
ALT: 20 U/L (ref 0–35)
AST: 14 U/L (ref 0–37)
Alkaline Phosphatase: 110 U/L (ref 39–117)
BUN: 14 mg/dL (ref 6–23)
CALCIUM: 9.2 mg/dL (ref 8.4–10.5)
CO2: 25 meq/L (ref 19–32)
CREATININE: 1 mg/dL (ref 0.50–1.10)
Chloride: 98 mEq/L (ref 96–112)
GFR calc Af Amer: 64 mL/min — ABNORMAL LOW (ref >90)
GFR, EST NON AFRICAN AMERICAN: 55 mL/min — AB (ref >90)
Glucose, Bld: 295 mg/dL — ABNORMAL HIGH (ref 70–99)
Potassium: 4.1 mEq/L (ref 3.7–5.3)
Sodium: 138 mEq/L (ref 137–147)
TOTAL PROTEIN: 7 g/dL (ref 6.0–8.3)
Total Bilirubin: 0.2 mg/dL — ABNORMAL LOW (ref 0.3–1.2)

## 2013-11-22 LAB — CBC
HCT: 43.1 % (ref 36.0–46.0)
Hemoglobin: 14.8 g/dL (ref 12.0–15.0)
MCH: 30.8 pg (ref 26.0–34.0)
MCHC: 34.3 g/dL (ref 30.0–36.0)
MCV: 89.6 fL (ref 78.0–100.0)
Platelets: 285 10*3/uL (ref 150–400)
RBC: 4.81 MIL/uL (ref 3.87–5.11)
RDW: 13.6 % (ref 11.5–15.5)
WBC: 10.2 10*3/uL (ref 4.0–10.5)

## 2013-11-22 LAB — SEDIMENTATION RATE: Sed Rate: 22 mm/hr (ref 0–22)

## 2013-11-22 MED ORDER — TRAMADOL HCL 50 MG PO TABS: 50.0000 mg | ORAL_TABLET | Freq: Four times a day (QID) | ORAL | Status: DC | PRN

## 2013-11-22 NOTE — ED Provider Notes (Signed)
CSN: [id number]     Arrival date & time 11/22/13  1656 History   None    Chief Complaint  Patient presents with  . Generalized Body Aches   (Consider location/radiation/quality/duration/timing/severity/associated sxs/prior Treatment) HPI Comments: Patient presents to [facility] with complaints of wide spread intermittent joint pain over past 2 month. 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 States she has been using various OTC NSAID medications to treat her pain but reports these medications "are not working." She states that she thinks she has "an infection" and that she needs an antibiotic to treat the infection that is causing her joint pain. She states that she has resorted to drinking 1-2 alcoholic beverages a night to treat her pain, but that this is only providing her limited relief. Denies fever, joint swelling, hx of gout. States sometimes pain is in both of her hands, sometimes at both hips or her knees or her ankles. States pain is worse at night.  PCP: none   The history is provided by the patient.    Past Medical History  Diagnosis Date  . Diabetes mellitus without complication   . Hypertension    Past Surgical History  Procedure Laterality Date  . Bladdee    . Bladder surgery    . Mole removal     History reviewed. No pertinent family history. History  Substance Use Topics  . Smoking status: Never Smoker   . Smokeless tobacco: Not on file  . Alcohol Use: Yes     Comment: socially   OB History   Grav Para Term Preterm Abortions TAB SAB Ect Mult Living                 Review of Systems  Constitutional: Negative.   HENT: Negative.   Eyes: Negative.   Respiratory: Negative.   Cardiovascular: Negative.   Gastrointestinal: Negative.   Endocrine: Negative for polydipsia, polyphagia and polyuria.  Genitourinary: Negative.    Musculoskeletal: Positive for arthralgias. Negative for back pain, joint swelling, myalgias and neck pain.  Skin: Negative.   Neurological: Negative.   Hematological: Does not bruise/bleed easily.  Psychiatric/Behavioral: Positive for dysphoric mood. Negative for suicidal ideas and self-injury. The patient is nervous/anxious.     Allergies  Amitriptyline; Ciprofloxacin; and Epinephrine  Home Medications   Current Outpatient Rx  Name  Route  Sig  Dispense  Refill  . Acetaminophen 650 MG TABS   Oral   Take 1 tablet (650 mg total) by mouth 3 (three) times daily as needed.   30 tablet   0   . albuterol (PROVENTIL HFA;VENTOLIN HFA) 108 (90 BASE) MCG/ACT inhaler   Inhalation   Inhale 1-2 puffs into the lungs every 6 (six) hours as needed for wheezing or shortness of breath.   1 Inhaler   0   . azithromycin (ZITHROMAX Z-PAK) 250 MG tablet      Take as directed.   6 tablet   0   . benzonatate (TESSALON) 200 MG capsule   Oral   Take 1 capsule (200 mg total) by mouth 3 (three) times daily as needed for cough.   30 capsule   0   . chlorpheniramine-HYDROcodone (TUSSIONEX) 10-8 MG/5ML LQCR   Oral   Take 5 mLs by mouth every 12 (twelve) hours as needed for cough.   140 mL   0   .  cyclobenzaprine (FLEXERIL) 10 MG tablet   Oral   Take 1 tablet (10 mg total) by mouth at bedtime as needed for muscle spasms.   15 tablet   0   . doxycycline (VIBRAMYCIN) 100 MG capsule   Oral   Take 1 capsule (100 mg total) by mouth 2 (two) times daily.   20 capsule   0   . fluticasone (FLONASE) 50 MCG/ACT nasal spray   Nasal   Place 2 sprays into the nose daily.   16 g   0   . guaiFENesin-codeine (GUIATUSS AC) 100-10 MG/5ML syrup   Oral   Take 10 mLs by mouth 4 (four) times daily as needed for cough.   120 mL   0   . guaiFENesin-codeine (GUIATUSS AC) 100-10 MG/5ML syrup   Oral   Take 10 mLs by mouth 4 (four) times daily as needed for cough.   120 mL   1   . metFORMIN  (GLUCOPHAGE) 500 MG tablet   Oral   Take 1 tablet (500 mg total) by mouth daily with breakfast.   30 tablet   2   . Naproxen Sodium (ALEVE PO)   Oral   Take by mouth.         . Omeprazole (PRILOSEC PO)   Oral   Take by mouth.         . predniSONE (DELTASONE) 20 MG tablet      3 daily for 5 days,  2 daily for 5 days, 1 daily for 5 days   30 tablet   0   . traMADol (ULTRAM) 50 MG tablet   Oral   Take 1 tablet (50 mg total) by mouth every 6 (six) hours as needed.   15 tablet   0    BP 180/77  Pulse 85  Temp(Src) 97.2 F (36.2 C) (Oral)  Resp 19  SpO2 97% Physical Exam  Nursing note and vitals reviewed. Constitutional: She is oriented to person, place, and time. She appears well-developed and well-nourished.  HENT:  Head: Normocephalic and atraumatic.  Eyes: Conjunctivae are normal. No scleral icterus.  Neck: Normal range of motion. Neck supple.  Cardiovascular: Normal rate.   Pulmonary/Chest: Effort normal.  Musculoskeletal:       Right hip: Normal.       Left hip: Normal.       Right knee: Normal.       Left knee: Normal.       Right ankle: Normal.       Left ankle: Normal.       Right hand: Normal.       Left hand: Normal.       Right foot: Normal.       Left foot: Normal.  Neurological: She is alert and oriented to person, place, and time.  Skin: Skin is warm and dry.  Psychiatric: Her speech is normal and behavior is normal. Judgment and thought content normal. Cognition and memory are normal. She exhibits a depressed mood.    ED Course  Procedures (including critical care time) Labs Review Labs Reviewed  CBC  COMPREHENSIVE METABOLIC PANEL  SEDIMENTATION RATE  ROCKY MTN SPOTTED FVR AB, IGG-BLOOD  B. BURGDORFI ANTIBODIES   Imaging Review No results found.   MDM   1. Stress and adjustment reaction   2. Joint pain   I explained to patient that she should discontinue NSAIDs if they were not providing her relief. Cautioned against  self-medicating with alcohol. I advised it would be  beneficial for her to establish relationship with PCP as the evaluation for chronic joint pain can require testing beyond what is available in an urgent care setting. In addition, she is dealing with stress and depression regarding her financial situation and this could also be best addressed and monitored by PCP. No SI or HI reported at today's exam. Provided patient resource for primary care provider. Advised that test results from today's visit would be reviewed when available and we could contact her by phone if any of the results indicated the need for additional treatment.     [place], [place] 11/22/13 2039

## 2013-11-22 NOTE — ED Notes (Signed)
Pt  Reports   Body  Aches         For  2  Months                  She  Reports      Ankles   Swelling         With    Symptoms  Of     Sensation of  Pins  And  Needles    In  extremitys as  Well        She  denys  Any  Recent  Injury            She  Reports  A history  Of  Diabetes  And  Hypertension       Takes  No  meds   States  It is  Diet  Controlled          she  Is sitting  Upright on the  Exam table  Speaking in  Complete  sentances  And  Is  In no  Acute  Distress       She  denys  Having a  Primary care  Provider

## 2013-11-22 NOTE — ED Provider Notes (Signed)
Medical screening examination/treatment/procedure(s) were performed by non-physician practitioner and as supervising physician I was immediately available for consultation/collaboration.  Philipp Deputy, M.D.  Harden Mo, MD 11/22/13 [phone]

## 2013-11-22 NOTE — Discharge Instructions (Signed)
Much of your blood work to investigate your joint pain will take 1-2 days for results to be available. If any of the results indicate the need for additional treatment, you will be notified by phone. I would strongly recommend that you contact the physician listed on your discharge paperwork to establish for primary care. Use medication as prescribed for pain. Do not use alcohol with Ultram (tramadol). Drink plenty of fluids (water) if you continue to take over the counter anti-inflammatory medication such as ibuprofen or Aleve.

## 2013-11-24 LAB — B. BURGDORFI ANTIBODIES: B burgdorferi Ab IgG+IgM: 0.26 {ISR}

## 2013-11-24 LAB — ROCKY MTN SPOTTED FVR AB, IGG-BLOOD: RMSF IgG: 0.06 IV

## 2013-12-04 ENCOUNTER — Telehealth (HOSPITAL_COMMUNITY): Payer: Self-pay | Admitting: *Deleted

## 2013-12-04 NOTE — ED Notes (Signed)
I called pt. Pt. verified x 2 and given results.  Pt. instructed to f/u with a PCP about elevated glucose.  She said its never been that high before.  She said she does not check her CBG daily.  Pt. said she plans to get a PCP. Hanley Se[facility] 12/04/2013

## 2014-01-05 ENCOUNTER — Emergency Department (HOSPITAL_COMMUNITY)
Admission: EM | Admit: 2014-01-05 | Discharge: 2014-01-05 | Disposition: A | Discharge status: Home / Self Care | Payer: Medicare Other | Source: Home / Self Care | Attending: Emergency Medicine | Admitting: Emergency Medicine

## 2014-01-05 ENCOUNTER — Encounter (HOSPITAL_COMMUNITY): Payer: Self-pay | Admitting: Emergency Medicine

## 2014-01-05 DIAGNOSIS — E1142 Type 2 diabetes mellitus with diabetic polyneuropathy: Secondary | ICD-10-CM

## 2014-01-05 DIAGNOSIS — E1140 Type 2 diabetes mellitus with diabetic neuropathy, unspecified: Secondary | ICD-10-CM

## 2014-01-05 DIAGNOSIS — E1149 Type 2 diabetes mellitus with other diabetic neurological complication: Secondary | ICD-10-CM

## 2014-01-05 DIAGNOSIS — E119 Type 2 diabetes mellitus without complications: Secondary | ICD-10-CM

## 2014-01-05 LAB — POCT I-STAT, CHEM 8
BUN: <3 mg/dL — ABNORMAL LOW (ref 6–23)
CALCIUM ION: 1.12 mmol/L — AB (ref 1.13–1.30)
CHLORIDE: 89 meq/L — AB (ref 96–112)
Creatinine, Ser: 1.2 mg/dL — ABNORMAL HIGH (ref 0.50–1.10)
Glucose, Bld: 300 mg/dL — ABNORMAL HIGH (ref 70–99)
HEMATOCRIT: 47 % — AB (ref 36.0–46.0)
Hemoglobin: 16 g/dL — ABNORMAL HIGH (ref 12.0–15.0)
Potassium: 3.8 mEq/L (ref 3.7–5.3)
Sodium: 136 mEq/L — ABNORMAL LOW (ref 137–147)
TCO2: 25 mmol/L (ref 0–100)

## 2014-01-05 MED ORDER — METFORMIN HCL 500 MG PO TABS: 500.0000 mg | ORAL_TABLET | Freq: Two times a day (BID) | ORAL | Status: DC

## 2014-01-05 MED ORDER — TRAMADOL HCL 50 MG PO TABS: 100.0000 mg | ORAL_TABLET | Freq: Three times a day (TID) | ORAL | Status: DC | PRN

## 2014-01-05 MED ORDER — GABAPENTIN 100 MG PO CAPS: ORAL_CAPSULE | ORAL | Status: DC

## 2014-01-05 NOTE — Discharge Instructions (Signed)
Type 2 Diabetes Mellitus, Adult Type 2 diabetes mellitus, often simply referred to as type 2 diabetes, is a long-lasting (chronic) disease. In type 2 diabetes, the pancreas does not make enough insulin (a hormone), the cells are less responsive to the insulin that is made (insulin resistance), or both. Normally, insulin moves sugars from food into the tissue cells. The tissue cells use the sugars for energy. The lack of insulin or the lack of normal response to insulin causes excess sugars to build up in the blood instead of going into the tissue cells. As a result, high blood sugar (hyperglycemia) develops. The effect of high sugar (glucose) levels can cause many complications. Type 2 diabetes was also previously called adult-onset diabetes but it can occur at any age.  RISK FACTORS  A person is predisposed to developing type 2 diabetes if someone in the family has the disease and also has one or more of the following primary risk factors:  Overweight.  An inactive lifestyle.  A history of consistently eating high-calorie foods. Maintaining a normal weight and regular physical activity can reduce the chance of developing type 2 diabetes. SYMPTOMS  A person with type 2 diabetes may not show symptoms initially. The symptoms of type 2 diabetes appear slowly. The symptoms include:  Increased thirst (polydipsia).  Increased urination (polyuria).  Increased urination during the night (nocturia).  Weight loss. This weight loss may be rapid.  Frequent, recurring infections.  Tiredness (fatigue).  Weakness.  Vision changes, such as blurred vision.  Fruity smell to your breath.  Abdominal pain.  Nausea or vomiting.  Cuts or bruises which are slow to heal.  Tingling or numbness in the hands or feet. DIAGNOSIS Type 2 diabetes is frequently not diagnosed until complications of diabetes are present. Type 2 diabetes is diagnosed when symptoms or complications are present and when blood  glucose levels are increased. Your blood glucose level may be checked by one or more of the following blood tests:  A fasting blood glucose test. You will not be allowed to eat for at least 8 hours before a blood sample is taken.  A random blood glucose test. Your blood glucose is checked at any time of the day regardless of when you ate.  A hemoglobin A1c blood glucose test. A hemoglobin A1c test provides information about blood glucose control over the previous 3 months.  An oral glucose tolerance test (OGTT). Your blood glucose is measured after you have not eaten (fasted) for 2 hours and then after you drink a glucose-containing beverage. TREATMENT   You may need to take insulin or diabetes medicine daily to keep blood glucose levels in the desired range.  You will need to match insulin dosing with exercise and healthy food choices. The treatment goal is to maintain the before meal blood sugar (preprandial glucose) level at 70 130 mg/dL. HOME CARE INSTRUCTIONS   Have your hemoglobin A1c level checked twice a year.  Perform daily blood glucose monitoring as directed by your caregiver.  Monitor urine ketones when you are ill and as directed by your caregiver.  Take your diabetes medicine or insulin as directed by your caregiver to maintain your blood glucose levels in the desired range.  Never run out of diabetes medicine or insulin. It is needed every day.  Adjust insulin based on your intake of carbohydrates. Carbohydrates can raise blood glucose levels but need to be included in your diet. Carbohydrates provide vitamins, minerals, and fiber which are an essential part of   a healthy diet. Carbohydrates are found in fruits, vegetables, whole grains, dairy products, legumes, and foods containing added sugars.    Eat healthy foods. Alternate 3 meals with 3 snacks.  Lose weight if overweight.  Carry a medical alert card or wear your medical alert jewelry.  Carry a 15 gram  carbohydrate snack with you at all times to treat low blood glucose (hypoglycemia). Some examples of 15 gram carbohydrate snacks include:  Glucose tablets, 3 or 4   Glucose gel, 15 gram tube  Raisins, 2 tablespoons (24 grams)  Jelly beans, 6  Animal crackers, 8  Regular pop, 4 ounces (120 mL)  Gummy treats, 9  Recognize hypoglycemia. Hypoglycemia occurs with blood glucose levels of 70 mg/dL and below. The risk for hypoglycemia increases when fasting or skipping meals, during or after intense exercise, and during sleep. Hypoglycemia symptoms can include:  Tremors or shakes.  Decreased ability to concentrate.  Sweating.  Increased heart rate.  Headache.  Dry mouth.  Hunger.  Irritability.  Anxiety.  Restless sleep.  Altered speech or coordination.  Confusion.  Treat hypoglycemia promptly. If you are alert and able to safely swallow, follow the 15:15 rule:  Take 15 20 grams of rapid-acting glucose or carbohydrate. Rapid-acting options include glucose gel, glucose tablets, or 4 ounces (120 mL) of fruit juice, regular soda, or low fat milk.  Check your blood glucose level 15 minutes after taking the glucose.  Take 15 20 grams more of glucose if the repeat blood glucose level is still 70 mg/dL or below.  Eat a meal or snack within 1 hour once blood glucose levels return to normal.    Be alert to polyuria and polydipsia which are early signs of hyperglycemia. An early awareness of hyperglycemia allows for prompt treatment. Treat hyperglycemia as directed by your caregiver.  Engage in at least 150 minutes of moderate-intensity physical activity a week, spread over at least 3 days of the week or as directed by your caregiver. In addition, you should engage in resistance exercise at least 2 times a week or as directed by your caregiver.  Adjust your medicine and food intake as needed if you start a new exercise or sport.  Follow your sick day plan at any time you  are unable to eat or drink as usual.  Avoid tobacco use.  Limit alcohol intake to no more than 1 drink per day for nonpregnant women and 2 drinks per day for men. You should drink alcohol only when you are also eating food. Talk with your caregiver whether alcohol is safe for you. Tell your caregiver if you drink alcohol several times a week.  Follow up with your caregiver regularly.  Schedule an eye exam soon after the diagnosis of type 2 diabetes and then annually.  Perform daily skin and foot care. Examine your skin and feet daily for cuts, bruises, redness, nail problems, bleeding, blisters, or sores. A foot exam by a caregiver should be done annually.  Brush your teeth and gums at least twice a day and floss at least once a day. Follow up with your dentist regularly.  Share your diabetes management plan with your workplace or school.  Stay up-to-date with immunizations.  Learn to manage stress.  Obtain ongoing diabetes education and support as needed.  Participate in, or seek rehabilitation as needed to maintain or improve independence and quality of life. Request a physical or occupational therapy referral if you are having foot or hand numbness or difficulties with grooming,   dressing, eating, or physical activity. SEEK MEDICAL CARE IF:   You are unable to eat food or drink fluids for more than 6 hours.  You have nausea and vomiting for more than 6 hours.  Your blood glucose level is over 240 mg/dL.  There is a change in mental status.  You develop an additional serious illness.  You have diarrhea for more than 6 hours.  You have been sick or have had a fever for a couple of days and are not getting better.  You have pain during any physical activity.  SEEK IMMEDIATE MEDICAL CARE IF:  You have difficulty breathing.  You have moderate to large ketone levels. MAKE SURE YOU:  Understand these instructions.  Will watch your condition.  Will get help right away if  you are not doing well or get worse. Document Released: 08/06/2005 Document Revised: 04/30/2012 Document Reviewed: 03/04/2012 [employer] Patient Information 2014 [employer].  

## 2014-01-05 NOTE — ED Provider Notes (Signed)
Chief Complaint    Chief Complaint  Patient presents with  . Generalized Body Aches    History of Present Illness     Donna Nguyen is a 73 year old female with type 2 diabetes who returns because of the generalized body pain. This is been going on for about 4 years. She was seen here on April 5 for the same thing. At that time she had testing done for [facility] spotted fever and for Lyme disease. These turned out negative. She also states she was tested for [place] Nile disease and vitamin D, but I do not see any evidence of these tests having been done. She was prescribed tramadol. She's been taking about 4 per day with good relief of her pain. The patient states that she's going through a foreclosure right now and is about to lose her house. She is in the process of moving a 31 room house full of furniture. She states because of this she does not have the time to followup with a primary care doctor. She becomes tearful when talking about this. She had the same history when she was here month ago. She describes pain from the hips on down to the feet, localized to the muscular areas of the legs. She denies any pain in the joints. There has been no swelling. She also has pain from the wrists on down into the hands. She denies any pain in the shoulders or elbows. She has had no swelling of the hands and no morning stiffness. Patient thinks she did better when she was on prednisone. She's also having pain in her back and neck. She experiences burning, stinging, and aching of the extremities. She's also had some numbness and tingling. She said she had some fever and diarrhea when she was here last, although states that has gotten better. She also has a number of other symptoms including poor vision, cough, chest pain, abdominal pain, and blood in the urine and stool a couple of months ago. Her only medications right now are Prilosec and tramadol. She's not taking anything for her diabetes. Not checking her  CBGs. The patient states she's allergic to amitriptyline.  Review of Systems     Other than as noted above, the patient denies any of the following symptoms: Systemic:  No fevers, chills, sweats, or muscle aches.  No weight loss.  Eye:  No redness, pain, or discharge. ENT:  No oral ulcerations or sore throat. Respiratory:  No shortness of breath or cough. Cardiovascular:  No chest pain. GI:  No abdominal pain or diarrhea. GU:  No dysuria or discharge. Musculoskeletal:  No back pain or neck pain. Neurological:  No muscular weakness, paresthesias, or headache.  [place]    Past medical history, family history, social history, meds, and allergies were reviewed.    Physical Exam    Vital signs:  BP 165/77  Pulse 90  Temp(Src) 98.4 F (36.9 C) (Oral)  Resp 18  SpO2 97% Gen:  Alert and oriented times 3.  In no distress. Eyes:  Lids normal, no conjunctival injection or discharge. ENT:  No oral ulcerations or lesions.  Pharynx clear. Neck:  No adenopathy. Lungs:  Clear to auscultation. Heart:  Regular rhythm, no gallop or murmer.  Abdomen:  No tenderness, organomegaly or mass.  Musculoskeletal: Joint survey reveals no joint or muscle tenderness to palpation, no swelling, no deformity or limited range of motion.  No edema, pulses full. Extremities were warm and pink.  Capillary refill was brisk.  Skin:  Clear, warm and dry.  No rash. Neuro:  Alert and oriented times 3.  Muscle strength was normal.  Sensation was intact to light touch.   Labs   Results for orders placed during the hospital encounter of 01/05/14  POCT I-STAT, CHEM 8      Result Value Ref Range   Sodium 136 (*) 137 - 147 mEq/L   Potassium 3.8  3.7 - 5.3 mEq/L   Chloride 89 (*) 96 - 112 mEq/L   BUN <3 (*) 6 - 23 mg/dL   Creatinine, Ser 1.20 (*) 0.50 - 1.10 mg/dL   Glucose, Bld 300 (*) 70 - 99 mg/dL   Calcium, Ion 1.12 (*) 1.13 - 1.30 mmol/L   TCO2 25  0 - 100 mmol/L   Hemoglobin 16.0 (*) 12.0 - 15.0 g/dL   HCT  47.0 (*) 36.0 - 46.0 %    Assessment    The primary encounter diagnosis was Diabetic neuropathy. A diagnosis of Type 2 diabetes mellitus was also pertinent to this visit.  There is no evidence of arthritic pain. I think this is all due to poorly controlled diabetes. She was restarted on metformin, and given a refill on tramadol, and we'll start on gabapentin as well. She was urged to followup with a primary care physician as soon as possible. She states that due to the foreclosure, earlier she will be able to get in to see someone is 2 months away.  Plan   1.  Meds:  The following meds were prescribed:   New Prescriptions   GABAPENTIN (NEURONTIN) 100 MG CAPSULE    1 daily for 3 days, 1 BID for 3 days, then 1 TID   METFORMIN (GLUCOPHAGE) 500 MG TABLET    Take 1 tablet (500 mg total) by mouth 2 (two) times daily with a meal.   TRAMADOL (ULTRAM) 50 MG TABLET    Take 2 tablets (100 mg total) by mouth every 8 (eight) hours as needed.    2.  Patient Education/Counseling:  The patient was given appropriate handouts, self care instructions, and instructed in symptomatic relief, including rest and activity and application of heat or ice.    3.  Follow up:  The patient was told to follow up here if no better in 3 to 4 days, or sooner if becoming worse in any way, and given some red flag symptoms such as worsening pain or new neurological symptoms which would prompt immediate return.  Followup with Dr. Rachell Cipro as soon as possible.    Harden Mo, MD 01/05/14 1045

## 2014-01-05 NOTE — ED Notes (Signed)
Pt  Reports   Body  Aches          Both  Arms  And  Legs    denys  Any  Injury     Pt  Was  Seen  Last  Month  For  Similar     Symptoms

## 2014-03-10 ENCOUNTER — Encounter (HOSPITAL_COMMUNITY): Payer: Self-pay | Admitting: Emergency Medicine

## 2014-03-10 ENCOUNTER — Emergency Department (HOSPITAL_COMMUNITY)
Admission: EM | Admit: 2014-03-10 | Discharge: 2014-03-10 | Disposition: A | Discharge status: Home / Self Care | Payer: Medicare Other | Source: Home / Self Care | Attending: Family Medicine | Admitting: Family Medicine

## 2014-03-10 DIAGNOSIS — R739 Hyperglycemia, unspecified: Secondary | ICD-10-CM

## 2014-03-10 DIAGNOSIS — R7309 Other abnormal glucose: Secondary | ICD-10-CM

## 2014-03-10 LAB — GLUCOSE, CAPILLARY: Glucose-Capillary: 195 mg/dL — ABNORMAL HIGH (ref 70–99)

## 2014-03-10 MED ORDER — GABAPENTIN 100 MG PO CAPS: 100.0000 mg | ORAL_CAPSULE | Freq: Three times a day (TID) | ORAL | Status: DC

## 2014-03-10 MED ORDER — TRAMADOL HCL 50 MG PO TABS: 50.0000 mg | ORAL_TABLET | Freq: Four times a day (QID) | ORAL | Status: DC | PRN

## 2014-03-10 MED ORDER — FREESTYLE SYSTEM KIT: 1.0000 | PACK | Freq: Two times a day (BID) | Status: DC

## 2014-03-10 NOTE — ED Provider Notes (Signed)
CSN: [id number]     Arrival date & time 03/10/14  1839 History   First MD Initiated Contact with Patient 03/10/14 1940     Chief Complaint  Patient presents with  . Hypoglycemia   (Consider location/radiation/quality/duration/timing/severity/associated sxs/prior Treatment) Patient is a 73 y.o. female presenting with hypoglycemia. The history is provided by the patient.  Hypoglycemia Severity:  Moderate Onset quality:  Sudden Duration:  1 week Progression:  Worsening (concern about metformin causing hypoglycemia over past week.) Chronicity:  New Diabetic status:  Controlled with oral medications Context comment:  Has not had lmd since dx in may 2015.   Past Medical History  Diagnosis Date  . Diabetes mellitus without complication   . Hypertension    Past Surgical History  Procedure Laterality Date  . Bladdee    . Bladder surgery    . Mole removal     No family history on file. History  Substance Use Topics  . Smoking status: Never Smoker   . Smokeless tobacco: Not on file  . Alcohol Use: Yes     Comment: socially   OB History   Grav Para Term Preterm Abortions TAB SAB Ect Mult Living                 Review of Systems  Constitutional: Negative.   HENT: Negative.   Respiratory: Negative.   Cardiovascular: Negative.   Gastrointestinal: Negative.   Endocrine: Negative.     Allergies  Amitriptyline; Ciprofloxacin; and Epinephrine  Home Medications   Prior to Admission medications   Medication Sig Start Date End Date Taking? Authorizing Provider  Acetaminophen 650 MG TABS Take 1 tablet (650 mg total) by mouth 3 (three) times daily as needed. 11/07/12   Adlih Moreno-Coll, MD  albuterol (PROVENTIL HFA;VENTOLIN HFA) 108 (90 BASE) MCG/ACT inhaler Inhale 1-2 puffs into the lungs every 6 (six) hours as needed for wheezing or shortness of breath. 09/12/13   Harden Mo, MD  azithromycin (ZITHROMAX Z-PAK) 250 MG tablet Take as directed. 09/12/13   Harden Mo, MD   benzonatate (TESSALON) 200 MG capsule Take 1 capsule (200 mg total) by mouth 3 (three) times daily as needed for cough. 08/17/12   Harden Mo, MD  chlorpheniramine-HYDROcodone (TUSSIONEX) 10-8 MG/5ML LQCR Take 5 mLs by mouth every 12 (twelve) hours as needed for cough. 09/19/13   Harden Mo, MD  cyclobenzaprine (FLEXERIL) 10 MG tablet Take 1 tablet (10 mg total) by mouth at bedtime as needed for muscle spasms. 11/07/12   Adlih Moreno-Coll, MD  doxycycline (VIBRAMYCIN) 100 MG capsule Take 1 capsule (100 mg total) by mouth 2 (two) times daily. 09/19/13   Harden Mo, MD  fluticasone (FLONASE) 50 MCG/ACT nasal spray Place 2 sprays into the nose daily. 11/07/12   Adlih Moreno-Coll, MD  gabapentin (NEURONTIN) 100 MG capsule 1 daily for 3 days, 1 BID for 3 days, then 1 TID 01/05/14   Harden Mo, MD  guaiFENesin-codeine [facility]) 100-10 MG/5ML syrup Take 10 mLs by mouth 4 (four) times daily as needed for cough. 09/12/13   Harden Mo, MD  guaiFENesin-codeine [facility]) 100-10 MG/5ML syrup Take 10 mLs by mouth 4 (four) times daily as needed for cough. 09/21/13   Harden Mo, MD  metFORMIN (GLUCOPHAGE) 500 MG tablet Take 1 tablet (500 mg total) by mouth daily with breakfast. 08/17/12   Harden Mo, MD  metFORMIN (GLUCOPHAGE) 500 MG tablet Take 1 tablet (500 mg total) by mouth 2 (two)  times daily with a meal. 01/05/14   Harden Mo, MD  Naproxen Sodium (ALEVE PO) Take by mouth.    Historical Provider, MD  Omeprazole (PRILOSEC PO) Take by mouth.    Historical Provider, MD  predniSONE (DELTASONE) 20 MG tablet 3 daily for 5 days,  2 daily for 5 days, 1 daily for 5 days 09/19/13   Harden Mo, MD  traMADol (ULTRAM) 50 MG tablet Take 1 tablet (50 mg total) by mouth every 6 (six) hours as needed. 11/22/13   Lahoma Rocker, PA  traMADol (ULTRAM) 50 MG tablet Take 2 tablets (100 mg total) by mouth every 8 (eight) hours as needed. 01/05/14   Harden Mo, MD   BP 148/66  Pulse 90   Temp(Src) 97.9 F (36.6 C) (Oral)  Wt 210 lb (95.255 kg)  SpO2 98% Physical Exam  Nursing note and vitals reviewed. Constitutional: She is oriented to person, place, and time. She appears well-developed and well-nourished.  Eyes: Pupils are equal, round, and reactive to light.  Neck: Normal range of motion. Neck supple.  Cardiovascular: Regular rhythm, normal heart sounds and intact distal pulses.   Pulmonary/Chest: Effort normal and breath sounds normal.  Neurological: She is alert and oriented to person, place, and time.  Skin: Skin is warm and dry.    ED Course  Procedures (including critical care time) Labs Review Labs Reviewed - No data to display cbg 195. Imaging Review No results found.   MDM  No diagnosis found.     Billy Fischer, MD 03/10/14 2101

## 2014-03-10 NOTE — Discharge Instructions (Signed)
Take your metformin at reduced dose and see your doctor when possible for further diabetes management.
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
## 2014-12-29 ENCOUNTER — Encounter (HOSPITAL_COMMUNITY): Payer: Self-pay | Admitting: *Deleted

## 2014-12-29 ENCOUNTER — Emergency Department (HOSPITAL_COMMUNITY)
Admission: EM | Admit: 2014-12-29 | Discharge: 2014-12-29 | Disposition: A | Discharge status: Home / Self Care | Payer: Medicare HMO | Attending: Emergency Medicine | Admitting: Emergency Medicine

## 2014-12-29 ENCOUNTER — Emergency Department (HOSPITAL_COMMUNITY): Payer: Medicare HMO

## 2014-12-29 DIAGNOSIS — S60511A Abrasion of right hand, initial encounter: Secondary | ICD-10-CM | POA: Insufficient documentation

## 2014-12-29 DIAGNOSIS — S80211A Abrasion, right knee, initial encounter: Secondary | ICD-10-CM | POA: Diagnosis not present

## 2014-12-29 DIAGNOSIS — R42 Dizziness and giddiness: Secondary | ICD-10-CM | POA: Diagnosis not present

## 2014-12-29 DIAGNOSIS — Y9389 Activity, other specified: Secondary | ICD-10-CM | POA: Insufficient documentation

## 2014-12-29 DIAGNOSIS — Y999 Unspecified external cause status: Secondary | ICD-10-CM | POA: Diagnosis not present

## 2014-12-29 DIAGNOSIS — Y92810 Car as the place of occurrence of the external cause: Secondary | ICD-10-CM | POA: Insufficient documentation

## 2014-12-29 DIAGNOSIS — S0083XA Contusion of other part of head, initial encounter: Secondary | ICD-10-CM | POA: Insufficient documentation

## 2014-12-29 DIAGNOSIS — E119 Type 2 diabetes mellitus without complications: Secondary | ICD-10-CM | POA: Insufficient documentation

## 2014-12-29 DIAGNOSIS — I10 Essential (primary) hypertension: Secondary | ICD-10-CM | POA: Diagnosis not present

## 2014-12-29 DIAGNOSIS — S0990XA Unspecified injury of head, initial encounter: Secondary | ICD-10-CM | POA: Diagnosis present

## 2014-12-29 NOTE — Discharge Instructions (Signed)
Please monitor for new or worsening signs or symptoms, return to the emergency room any present. Please follow-up with [facility] for further evaluation and management. Please use ibuprofen or Tylenol as needed for pain and ice and use as well.

## 2014-12-29 NOTE — ED Notes (Signed)
Patient took off BP cuff and Pulse ox states she was told she was going home and no need to be put back on.
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
## 2014-12-29 NOTE — ED Notes (Signed)
Attempted to clean blood off patient ear. Patient stated to painful and requested nurse to stop. Nurse placed a Sterile water gauze over ear and allowed to soak.

## 2014-12-29 NOTE — ED Notes (Signed)
[employer] speaking with pt.

## 2015-03-14 ENCOUNTER — Encounter: Payer: Self-pay | Admitting: Internal Medicine

## 2015-03-14 ENCOUNTER — Ambulatory Visit: Payer: Medicare HMO | Attending: Internal Medicine | Admitting: Internal Medicine

## 2015-03-14 ENCOUNTER — Other Ambulatory Visit: Payer: Self-pay

## 2015-03-14 VITALS — BP 131/78 | HR 101 | Temp 98.0°F | Resp 18 | Ht 64.5 in | Wt 195.8 lb

## 2015-03-14 DIAGNOSIS — Z79899 Other long term (current) drug therapy: Secondary | ICD-10-CM | POA: Insufficient documentation

## 2015-03-14 DIAGNOSIS — E119 Type 2 diabetes mellitus without complications: Secondary | ICD-10-CM | POA: Insufficient documentation

## 2015-03-14 DIAGNOSIS — S098XXD Other specified injuries of head, subsequent encounter: Secondary | ICD-10-CM | POA: Diagnosis not present

## 2015-03-14 DIAGNOSIS — E1149 Type 2 diabetes mellitus with other diabetic neurological complication: Secondary | ICD-10-CM | POA: Insufficient documentation

## 2015-03-14 DIAGNOSIS — N644 Mastodynia: Secondary | ICD-10-CM | POA: Diagnosis not present

## 2015-03-14 DIAGNOSIS — I10 Essential (primary) hypertension: Secondary | ICD-10-CM | POA: Diagnosis not present

## 2015-03-14 DIAGNOSIS — R413 Other amnesia: Secondary | ICD-10-CM | POA: Diagnosis not present

## 2015-03-14 DIAGNOSIS — R079 Chest pain, unspecified: Secondary | ICD-10-CM | POA: Diagnosis not present

## 2015-03-14 DIAGNOSIS — S0990XD Unspecified injury of head, subsequent encounter: Secondary | ICD-10-CM | POA: Diagnosis not present

## 2015-03-14 LAB — GLUCOSE, POCT (MANUAL RESULT ENTRY): POC Glucose: 192 mg/dl — AB (ref 70–99)

## 2015-03-14 LAB — POCT GLYCOSYLATED HEMOGLOBIN (HGB A1C): Hemoglobin A1C: 9.2

## 2015-03-14 MED ORDER — GLIMEPIRIDE 2 MG PO TABS: 2.0000 mg | ORAL_TABLET | Freq: Every day | ORAL | Status: DC
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
## 2015-03-14 NOTE — Patient Instructions (Addendum)
I have sent referral to Neurology. They will call you directly.   You will come back in 2 weeks for the nurse to review your blood sugar log. I have placed you on a medication called amaryl 2 mg. If it is causing your sugar to drop you can take 1/2 pill. You may schedule another appointment to discuss 3 more issues We will follow up every 3 months for diabetes management. Next visit please come fasting so that I may check your cholesterol.   Glimepiride tablets What is this medicine? GLIMEPIRIDE (GLYE me pye ride) helps to treat type 2 diabetes. Treatment is combined with diet and exercise. This medicine helps your body use insulin better. This medicine may be used for other purposes; ask your health care provider or pharmacist if you have questions. COMMON BRAND NAME(S): Amaryl What should I tell my health care provider before I take this medicine? They need to know if you have any of these conditions: -diabetic ketoacidosis -glucose-6-phosphate dehydrogenase deficiency -heart disease -kidney disease -liver disease -severe infection or injury -thyroid disease -an unusual or allergic reaction to glimepiride, sulfa drugs, other medicines, foods, dyes, or preservatives -pregnancy or recent attempts to get pregnant -breast-feeding How should I use this medicine? Take this medicine by mouth. Swallow with a drink of water. Follow the directions on the prescription label. Take your dose at the same time each day, with breakfast or your first large meal. Do not take more often than directed. Talk to your pediatrician regarding the use of this medicine in children. Special care may be needed. Elderly patients over 77 years old can have a stronger reaction and need a smaller dose. Overdosage: If you think you have taken too much of this medicine contact a poison control center or emergency room at once. NOTE: This medicine is only for you. Do not share this medicine with others. What if I miss a  dose? If you miss a dose, take it as soon as you can. If it is almost time for your next dose, take only that dose. Do not take double or extra doses. What may interact with this medicine? -bosentan -chloramphenicol -cisapride -clarithromycin -medicines for fungal or yeast infections -metoclopramide -probenecid -warfarin Many medications may cause an increase or decrease in blood sugar, these include: -alcohol containing beverages -aspirin and aspirin-like drugs -chloramphenicol -chromium -female hormones, like estrogens or progestins and birth control pills -fluoxetine -heart medicines like disopyramide -isoniazid -female hormones or anabolic steroids -medicines called MAO Inhibitors like Nardil, Parnate, Marplan, Eldepryl -medicines for allergies, asthma, cold, or cough -medicines for mental problems -medicines for weight loss -niacin -NSAIDs, medicines for pain and inflammation, like ibuprofen or naproxen -pentamidine -phenytoin -probenecid -quinolone antibiotics like ciprofloxacin, levofloxacin, ofloxacin -some herbal dietary supplements -steroid medicines like prednisone or cortisone -thyroid medicine -water pills or diuretics This list may not describe all possible interactions. Give your health care provider a list of all the medicines, herbs, non-prescription drugs, or dietary supplements you use. Also tell them if you smoke, drink alcohol, or use illegal drugs. Some items may interact with your medicine. What should I watch for while using this medicine? Visit your doctor or health care professional for regular checks on your progress. A test called the HbA1C (A1C) will be monitored. This is a simple blood test. It measures your blood sugar control over the last 2 to 3 months. You will receive this test every 3 to 6 months. Learn how to check your blood sugar. Learn the symptoms of  low and high blood sugar and how to manage them. Always carry a quick-source of sugar  with you in case you have symptoms of low blood sugar. Examples include hard sugar candy or glucose tablets. Make sure others know that you can choke if you eat or drink when you develop serious symptoms of low blood sugar, such as seizures or unconsciousness. They must get medical help at once. Tell your doctor or health care professional if you have high blood sugar. You might need to change the dose of your medicine. If you are sick or exercising more than usual, you might need to change the dose of your medicine. Do not skip meals. Ask your doctor or health care professional if you should avoid alcohol. Many nonprescription cough and cold products contain sugar or alcohol. These can affect blood sugar. This medicine can make you more sensitive to the sun. Keep out of the sun. If you cannot avoid being in the sun, wear protective clothing and use sunscreen. Do not use sun lamps or tanning beds/booths. Wear a medical ID bracelet or chain, and carry a card that describes your disease and details of your medicine and dosage times. What side effects may I notice from receiving this medicine? Side effects that you should report to your doctor or health care professional as soon as possible: -allergic reactions like skin rash, itching or hives, swelling of the face, lips, or tongue -breathing problems -dark urine -fever, chills, sore throat -signs and symptoms of low blood sugar such as feeling anxious, confusion, dizziness, increased hunger, unusually weak or tired, sweating, shakiness, cold, irritable, headache, blurred vision, fast heartbeat, loss of consciousness -unusual bleeding or bruising -yellowing of the eyes or skin Side effects that usually do not require medical attention (report to your doctor or health care professional if they continue or are bothersome): -diarrhea -dizziness -headache -heartburn -nausea -stomach gas This list may not describe all possible side effects. Call your  doctor for medical advice about side effects. You may report side effects to FDA at 1-800-FDA-1088. Where should I keep my medicine? Keep out of the reach of children. Store at room temperature below 30 degrees C (86 degrees F). Throw away any unused medicine after the expiration date. NOTE: This sheet is a summary. It may not cover all possible information. If you have questions about this medicine, talk to your doctor, pharmacist, or health care provider.  2015, [employer]. (2012-11-19 14:29:47)
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
## 2015-03-15 LAB — MICROALBUMIN, URINE: MICROALB UR: 0.2 mg/dL (ref <2.0)

## 2015-03-22 ENCOUNTER — Telehealth: Payer: Self-pay | Admitting: Clinical

## 2015-03-22 NOTE — Telephone Encounter (Signed)
F/u w pt; Pt states her 73yo cat passed away this week. Pt aware that [facility] will be out of office when she returns on 03-31-15, but she is encouraged to return for f/u with [facility].

## 2015-03-31 ENCOUNTER — Encounter: Payer: Medicare HMO | Admitting: Pharmacist

## 2015-04-14 ENCOUNTER — Encounter (HOSPITAL_COMMUNITY): Payer: Self-pay | Admitting: Neurology

## 2015-04-14 ENCOUNTER — Emergency Department (HOSPITAL_COMMUNITY)
Admission: EM | Admit: 2015-04-14 | Discharge: 2015-04-14 | Disposition: A | Discharge status: Home / Self Care | Payer: Commercial Managed Care - HMO | Attending: Emergency Medicine | Admitting: Emergency Medicine

## 2015-04-14 DIAGNOSIS — Y9389 Activity, other specified: Secondary | ICD-10-CM | POA: Insufficient documentation

## 2015-04-14 DIAGNOSIS — Y9289 Other specified places as the place of occurrence of the external cause: Secondary | ICD-10-CM | POA: Insufficient documentation

## 2015-04-14 DIAGNOSIS — I10 Essential (primary) hypertension: Secondary | ICD-10-CM | POA: Insufficient documentation

## 2015-04-14 DIAGNOSIS — B3789 Other sites of candidiasis: Secondary | ICD-10-CM

## 2015-04-14 DIAGNOSIS — E119 Type 2 diabetes mellitus without complications: Secondary | ICD-10-CM | POA: Insufficient documentation

## 2015-04-14 DIAGNOSIS — S61252A Open bite of right middle finger without damage to nail, initial encounter: Secondary | ICD-10-CM | POA: Diagnosis not present

## 2015-04-14 DIAGNOSIS — W5501XA Bitten by cat, initial encounter: Secondary | ICD-10-CM | POA: Insufficient documentation

## 2015-04-14 DIAGNOSIS — Y998 Other external cause status: Secondary | ICD-10-CM | POA: Insufficient documentation

## 2015-04-14 DIAGNOSIS — Z79899 Other long term (current) drug therapy: Secondary | ICD-10-CM | POA: Insufficient documentation

## 2015-04-14 DIAGNOSIS — B379 Candidiasis, unspecified: Secondary | ICD-10-CM | POA: Diagnosis not present

## 2015-04-14 DIAGNOSIS — S61202A Unspecified open wound of right middle finger without damage to nail, initial encounter: Secondary | ICD-10-CM | POA: Insufficient documentation

## 2015-04-14 DIAGNOSIS — R8290 Unspecified abnormal findings in urine: Secondary | ICD-10-CM | POA: Diagnosis present

## 2015-04-14 DIAGNOSIS — L22 Diaper dermatitis: Secondary | ICD-10-CM | POA: Diagnosis not present

## 2015-04-14 LAB — URINALYSIS, ROUTINE W REFLEX MICROSCOPIC
Bilirubin Urine: NEGATIVE
Glucose, UA: NEGATIVE mg/dL
Hgb urine dipstick: NEGATIVE
Ketones, ur: 15 mg/dL — AB
Nitrite: NEGATIVE
Protein, ur: NEGATIVE mg/dL
Specific Gravity, Urine: 1.022 (ref 1.005–1.030)
Urobilinogen, UA: 1 mg/dL (ref 0.0–1.0)
pH: 6 (ref 5.0–8.0)

## 2015-04-14 LAB — URINE MICROSCOPIC-ADD ON

## 2015-04-14 LAB — CBG MONITORING, ED: Glucose-Capillary: 200 mg/dL — ABNORMAL HIGH (ref 65–99)

## 2015-04-14 MED ORDER — AMOXICILLIN-POT CLAVULANATE 875-125 MG PO TABS: 1.0000 | ORAL_TABLET | Freq: Two times a day (BID) | ORAL | Status: DC

## 2015-04-14 MED ORDER — IBUPROFEN 800 MG PO TABS: 800.0000 mg | ORAL_TABLET | Freq: Three times a day (TID) | ORAL | Status: DC

## 2015-04-14 MED ORDER — NYSTATIN 100000 UNIT/GM EX POWD: CUTANEOUS | Status: DC

## 2015-04-14 NOTE — Care Management (Signed)
ED CM consulted concerning medication assistance.  Patient goes to the [facility] for Primary Care. [employer] source.  CM spoke with patient at bedside regarding medication issue. Patient states, that she does not have the copay for her medications at this time. Discussed using the [facility] pharmacy, they can waive the co-pay, patient is amendable. CM will contact [facility] Pharmacist in the am regarding medication assistance. CM faxed prescriptions to  Pharmacy hard copies given to patient. Patient verbalized understanding and is amendable to plan. No further ED CM needs identified.

## 2015-04-14 NOTE — ED Provider Notes (Signed)
CSN: [id number]     Arrival date & time 04/14/15  1601 History   First MD Initiated Contact with Patient 04/14/15 1729     Chief Complaint  Patient presents with  . Urinary Tract Infection     (Consider location/radiation/quality/duration/timing/severity/associated sxs/prior Treatment) HPI Comments: 74 year old female presenting with dark colored, malodorous urine over the past 2 months. States she tried to go to her PCP for this and they "brushed it off" and didn't even observe the fact she had this complaint. Endorses subjective fevers over the past 3 weeks. Also reports a rash to her groin areas on both sides 3 weeks that is very irritated. Tried applying hydrocortisone cream and topical antibiotic ointment with no relief. Denies dysuria, hematuria, increased urinary frequency or urgency. Denies abdominal pain, nausea or vomiting. Also states she was bit by her 36 year old cat 3 weeks ago that her dog killed during her right middle finger, states the area was extremely swollen 3 weeks ago and has gradually started to subside on its own without any intervention. She has is a diabetic but does not take medication for this. States her blood sugars have been running around 190.  Patient is a 74 y.o. female presenting with urinary tract infection. The history is provided by the patient.  Urinary Tract Infection   Past Medical History  Diagnosis Date  . Diabetes mellitus without complication   . Hypertension    Past Surgical History  Procedure Laterality Date  . Bladdee    . Bladder surgery    . Mole removal     No family history on file. Social History  Substance Use Topics  . Smoking status: Never Smoker   . Smokeless tobacco: None  . Alcohol Use: Yes     Comment: socially   OB History    No data available     Review of Systems  Genitourinary:       + Dark foul-smelling urine.  Musculoskeletal:       + Right middle finger swelling.  Skin: Positive for color change and  rash.  All other systems reviewed and are negative.     Allergies  Amitriptyline; Ciprofloxacin; and Epinephrine  Home Medications   Prior to Admission medications   Medication Sig Start Date End Date Taking? Authorizing Provider  hydrocortisone cream 1 % Place 1 application vaginally daily as needed for itching.   Yes Historical Provider, MD  neomycin-polymyxin-pramoxine (NEOSPORIN PLUS) 1 % cream Place 1 application vaginally daily as needed (UTI).   Yes Historical Provider, MD  ranitidine (ZANTAC) 150 MG capsule Take 150 mg by mouth 2 (two) times daily.   Yes Historical Provider, MD  albuterol (PROVENTIL HFA;VENTOLIN HFA) 108 (90 BASE) MCG/ACT inhaler Inhale 1-2 puffs into the lungs every 6 (six) hours as needed for wheezing or shortness of breath. Patient not taking: Reported on 12/29/2014 09/12/13   Harden Mo, MD  amoxicillin-clavulanate (AUGMENTIN) 875-125 MG per tablet Take 1 tablet by mouth 2 (two) times daily. One po bid x 7 days 04/14/15   Carman Ching, PA-C  gabapentin (NEURONTIN) 100 MG capsule Take 1 capsule (100 mg total) by mouth 3 (three) times daily. Patient not taking: Reported on 12/29/2014 03/10/14   Billy Fischer, MD  glimepiride (AMARYL) 2 MG tablet Take 1 tablet (2 mg total) by mouth daily with breakfast. Patient not taking: Reported on 04/14/2015 03/14/15   Lance Bosch, NP  ibuprofen (ADVIL,MOTRIN) 800 MG tablet Take 1 tablet (800 mg total) by mouth  3 (three) times daily. 04/14/15   Carman Ching, PA-C  metFORMIN (GLUCOPHAGE) 500 MG tablet Take 1 tablet (500 mg total) by mouth 2 (two) times daily with a meal. Patient not taking: Reported on 12/29/2014 01/05/14   Harden Mo, MD  nystatin (MYCOSTATIN/NYSTOP) 100000 UNIT/GM POWD Apply to affected area twice daily. 04/14/15   Colleen Kotlarz M Ryanne Morand, PA-C   BP 159/66 mmHg  Pulse 93  Temp(Src) 98.5 F (36.9 C) (Oral)  Resp 16  SpO2 97% Physical Exam  Constitutional: She is oriented to person, place, and time. She appears  well-developed and well-nourished. No distress.  HENT:  Head: Normocephalic and atraumatic.  Mouth/Throat: Oropharynx is clear and moist.  Eyes: Conjunctivae and EOM are normal.  Neck: Normal range of motion. Neck supple.  Cardiovascular: Normal rate, regular rhythm and normal heart sounds.   Pulmonary/Chest: Effort normal and breath sounds normal. No respiratory distress.  Abdominal: Soft. Bowel sounds are normal. There is no tenderness.  Musculoskeletal:  Swelling to proximal right middle phalanx. Able to fully flex and extend the MCP, PIP and DIP. Mild tenderness. No visible wound.  Neurological: She is alert and oriented to person, place, and time. No sensory deficit.  Skin: Skin is warm and dry.  Moist, erythematous rash to bilateral groin area and labia majora.  Psychiatric: She has a normal mood and affect. Her behavior is normal.  Nursing note and vitals reviewed.   ED Course  Procedures (including critical care time) Labs Review Labs Reviewed  URINALYSIS, ROUTINE W REFLEX MICROSCOPIC (NOT AT [facility]) - Abnormal; Notable for the following:    APPearance CLOUDY (*)    Ketones, ur 15 (*)    Leukocytes, UA SMALL (*)    All other components within normal limits  URINE MICROSCOPIC-ADD ON - Abnormal; Notable for the following:    Squamous Epithelial / LPF FEW (*)    Bacteria, UA FEW (*)    All other components within normal limits  URINE CULTURE  CBG MONITORING, ED    Imaging Review No results found. I have personally reviewed and evaluated these images and lab results as part of my medical decision-making.   EKG Interpretation None      MDM   Final diagnoses:  Candida rash of groin  Cat bite, initial encounter   Nontoxic appearing, NAD. AFVSS.  UA contaminated, only small leukocytes, few bacteria and 7-10 white blood cells. Culture pending. Only urinary symptom is dark urine. Hold on treatment for UTI at this time. Regarding rash around her groin, it appears to be  fungal. Will treat with nystatin powder. Abdomen soft and nontender. The stated cat bite that has been present for 3 weeks is much better per patient. There is still some redness and swelling but she is able to perform full range of motion and no evidence of tendon involvement. Will treat with Augmentin. Patient has trouble affording medication, case management saw the patient and is able to help with this issue. She advised [facility] Prescriptions e-prescribed to there. They were also faxed. Stable for discharge. F/u with PCP. Return precautions given. Patient states understanding of treatment care plan and is agreeable.  Discussed with Dr. Wilson Singer, agrees with plan.  Carman Ching, PA-C 04/14/15 1910  Virgel Manifold, MD 04/21/15 [phone]

## 2015-04-14 NOTE — ED Notes (Signed)
Pt st's she thinks she has a UTI.  St's her urine has been dark with foul odor,  Pt alert and oriented x's 3, skin warm and dry color appropriate

## 2015-04-14 NOTE — Discharge Instructions (Signed)
Apply cream to your vaginal area twice daily. Take the antibiotic twice daily for 1 week.  Candida Infection A Candida infection (also called yeast, fungus, and Monilia infection) is an overgrowth of yeast that can occur anywhere on the body. A yeast infection commonly occurs in warm, moist body areas. Usually, the infection remains localized but can spread to become a systemic infection. A yeast infection may be a sign of a more severe disease such as diabetes, leukemia, or AIDS. A yeast infection can occur in both men and women. In women, Candida vaginitis is a vaginal infection. It is one of the most common causes of vaginitis. Men usually do not have symptoms or know they have an infection until other problems develop. Men may find out they have a yeast infection because their sex partner has a yeast infection. Uncircumcised men are more likely to get a yeast infection than circumcised men. This is because the uncircumcised glans is not exposed to air and does not remain as dry as that of a circumcised glans. Older adults may develop yeast infections around dentures. CAUSES  Women  Antibiotics.  Steroid medication taken for a long time.  Being overweight (obese).  Diabetes.  Poor immune condition.  Certain serious medical conditions.  Immune suppressive medications for organ transplant patients.  Chemotherapy.  Pregnancy.  Menstruation.  Stress and fatigue.  Intravenous drug use.  Oral contraceptives.  Wearing tight-fitting clothes in the crotch area.  Catching it from a sex partner who has a yeast infection.  Spermicide.  Intravenous, urinary, or other catheters. Men  Catching it from a sex partner who has a yeast infection.  Having oral or anal sex with a person who has the infection.  Spermicide.  Diabetes.  Antibiotics.  Poor immune system.  Medications that suppress the immune system.  Intravenous drug use.  Intravenous, urinary, or other  catheters. SYMPTOMS  Women  Thick, white vaginal discharge.  Vaginal itching.  Redness and swelling in and around the vagina.  Irritation of the lips of the vagina and perineum.  Blisters on the vaginal lips and perineum.  Painful sexual intercourse.  Low blood sugar (hypoglycemia).  Painful urination.  Bladder infections.  Intestinal problems such as constipation, indigestion, bad breath, bloating, increase in gas, diarrhea, or loose stools. Men  Men may develop intestinal problems such as constipation, indigestion, bad breath, bloating, increase in gas, diarrhea, or loose stools.  Dry, cracked skin on the penis with itching or discomfort.  Jock itch.  Dry, flaky skin.  Athlete's foot.  Hypoglycemia. DIAGNOSIS  Women  A history and an exam are performed.  The discharge may be examined under a microscope.  A culture may be taken of the discharge. Men  A history and an exam are performed.  Any discharge from the penis or areas of cracked skin will be looked at under the microscope and cultured.  Stool samples may be cultured. TREATMENT  Women  Vaginal antifungal suppositories and creams.  Medicated creams to decrease irritation and itching on the outside of the vagina.  Warm compresses to the perineal area to decrease swelling and discomfort.  Oral antifungal medications.  Medicated vaginal suppositories or cream for repeated or recurrent infections.  Wash and dry the irritation areas before applying the cream.  Eating yogurt with Lactobacillus may help with prevention and treatment.  Sometimes painting the vagina with gentian violet solution may help if creams and suppositories do not work. Men  Antifungal creams and oral antifungal medications.  Sometimes  treatment must continue for 30 days after the symptoms go away to prevent recurrence. HOME CARE INSTRUCTIONS  Women  Use cotton underwear and avoid tight-fitting clothing.  Avoid  colored, scented toilet paper and deodorant tampons or pads.  Do not douche.  Keep your diabetes under control.  Finish all the prescribed medications.  Keep your skin clean and dry.  Consume milk or yogurt with Lactobacillus-active culture regularly. If you get frequent yeast infections and think that is what the infection is, there are over-the-counter medications that you can get. If the infection does not show healing in 3 days, talk to your caregiver.  Tell your sex partner you have a yeast infection. Your partner may need treatment also, especially if your infection does not clear up or recurs. Men  Keep your skin clean and dry.  Keep your diabetes under control.  Finish all prescribed medications.  Tell your sex partner that you have a yeast infection so he or she can be treated if necessary. SEEK MEDICAL CARE IF:   Your symptoms do not clear up or worsen in one week after treatment.  You have an oral temperature above 102 F (38.9 C).  You have trouble swallowing or eating for a prolonged time.  You develop blisters on and around your vagina.  You develop vaginal bleeding and it is not your menstrual period.  You develop abdominal pain.  You develop intestinal problems as mentioned above.  You get weak or light-headed.  You have painful or increased urination.  You have pain during sexual intercourse. MAKE SURE YOU:   Understand these instructions.  Will watch your condition.  Will get help right away if you are not doing well or get worse. Document Released: 09/13/2004 Document Revised: 12/21/2013 Document Reviewed: 12/26/2009 [facility] Patient Information 2015 [place], [place]. This information is not intended to replace advice given to you by your health care provider. Make sure you discuss any questions you have with your health care provider.  Animal Bite An animal bite can result in a scratch on the skin, deep open cut, puncture of the skin, crush  injury, or tearing away of the skin or a body part. Dogs are responsible for most animal bites. Children are bitten more often than adults. An animal bite can range from very mild to more serious. A small bite from your house pet is no cause for alarm. However, some animal bites can become infected or injure a bone or other tissue. You must seek medical care if:  The skin is broken and bleeding does not slow down or stop after 15 minutes.  The puncture is deep and difficult to clean (such as a cat bite).  Pain, warmth, redness, or pus develops around the wound.  The bite is from a stray animal or rodent. There may be a risk of rabies infection.  The bite is from a snake, raccoon, skunk, fox, coyote, or bat. There may be a risk of rabies infection.  The person bitten has a chronic illness such as diabetes, liver disease, or cancer, or the person takes medicine that lowers the immune system.  There is concern about the location and severity of the bite. It is important to clean and protect an animal bite wound right away to prevent infection. Follow these steps:  Clean the wound with plenty of water and soap.  Apply an antibiotic cream.  Apply gentle pressure over the wound with a clean towel or gauze to slow or stop bleeding.  Elevate  the affected area above the heart to help stop any bleeding.  Seek medical care. Getting medical care within 8 hours of the animal bite leads to the best possible outcome. DIAGNOSIS  Your caregiver will most likely:  Take a detailed history of the animal and the bite injury.  Perform a wound exam.  Take your medical history. Blood tests or X-rays may be performed. Sometimes, infected bite wounds are cultured and sent to a lab to identify the infectious bacteria.  TREATMENT  Medical treatment will depend on the location and type of animal bite as well as the patient's medical history. Treatment may include:  Wound care, such as cleaning and flushing  the wound with saline solution, bandaging, and elevating the affected area.  Antibiotics.  Tetanus immunization.  Rabies immunization.  Leaving the wound open to heal. This is often done with animal bites, due to the high risk of infection. However, in certain cases, wound closure with stitches, wound adhesive, skin adhesive strips, or staples may be used. Infected bites that are left untreated may require intravenous (IV) antibiotics and surgical treatment in the hospital. [place]  Follow your caregiver's instructions for wound care.  Take all medicines as directed.  If your caregiver prescribes antibiotics, take them as directed. Finish them even if you start to feel better.  Follow up with your caregiver for further exams or immunizations as directed. You may need a tetanus shot if:  You cannot remember when you had your last tetanus shot.  You have never had a tetanus shot.  The injury broke your skin. If you get a tetanus shot, your arm may swell, get red, and feel warm to the touch. This is common and not a problem. If you need a tetanus shot and you choose not to have one, there is a rare chance of getting tetanus. Sickness from tetanus can be serious. SEEK MEDICAL CARE IF:  You notice warmth, redness, soreness, swelling, pus discharge, or a bad smell coming from the wound.  You have a red line on the skin coming from the wound.  You have a fever, chills, or a general ill feeling.  You have nausea or vomiting.  You have continued or worsening pain.  You have trouble moving the injured part.  You have other questions or concerns. MAKE SURE YOU:  Understand these instructions.  Will watch your condition.  Will get help right away if you are not doing well or get worse. Document Released: 04/24/2011 Document Revised: 10/29/2011 Document Reviewed: 04/24/2011 [facility] Patient Information 2015 [place], [place]. This information is not intended to  replace advice given to you by your health care provider. Make sure you discuss any questions you have with your health care provider.

## 2015-04-14 NOTE — ED Notes (Signed)
Pt reports dark urine, malodorous, also has rash to pubic area. Pt thinks she has been running a fever for the last 3 weeks. Reports needing aid with paying for antibiotics if necessary.

## 2015-04-14 NOTE — ED Notes (Signed)
Case manager in with pt at this time

## 2015-04-16 LAB — URINE CULTURE

## 2015-04-19 ENCOUNTER — Emergency Department (HOSPITAL_COMMUNITY)
Admission: EM | Admit: 2015-04-19 | Discharge: 2015-04-19 | Disposition: A | Discharge status: Home / Self Care | Payer: Commercial Managed Care - HMO | Attending: Emergency Medicine | Admitting: Emergency Medicine

## 2015-04-19 ENCOUNTER — Encounter (HOSPITAL_COMMUNITY): Payer: Self-pay | Admitting: Emergency Medicine

## 2015-04-19 ENCOUNTER — Emergency Department (HOSPITAL_COMMUNITY): Payer: Commercial Managed Care - HMO

## 2015-04-19 DIAGNOSIS — I252 Old myocardial infarction: Secondary | ICD-10-CM | POA: Insufficient documentation

## 2015-04-19 DIAGNOSIS — E119 Type 2 diabetes mellitus without complications: Secondary | ICD-10-CM | POA: Diagnosis not present

## 2015-04-19 DIAGNOSIS — M1611 Unilateral primary osteoarthritis, right hip: Secondary | ICD-10-CM | POA: Insufficient documentation

## 2015-04-19 DIAGNOSIS — M545 Low back pain: Secondary | ICD-10-CM | POA: Diagnosis not present

## 2015-04-19 DIAGNOSIS — Y998 Other external cause status: Secondary | ICD-10-CM | POA: Insufficient documentation

## 2015-04-19 DIAGNOSIS — Z8673 Personal history of transient ischemic attack (TIA), and cerebral infarction without residual deficits: Secondary | ICD-10-CM | POA: Diagnosis not present

## 2015-04-19 DIAGNOSIS — Y9289 Other specified places as the place of occurrence of the external cause: Secondary | ICD-10-CM | POA: Diagnosis not present

## 2015-04-19 DIAGNOSIS — M25551 Pain in right hip: Secondary | ICD-10-CM | POA: Diagnosis not present

## 2015-04-19 DIAGNOSIS — M47816 Spondylosis without myelopathy or radiculopathy, lumbar region: Secondary | ICD-10-CM | POA: Diagnosis not present

## 2015-04-19 DIAGNOSIS — S3992XA Unspecified injury of lower back, initial encounter: Secondary | ICD-10-CM | POA: Insufficient documentation

## 2015-04-19 DIAGNOSIS — W11XXXA Fall on and from ladder, initial encounter: Secondary | ICD-10-CM | POA: Insufficient documentation

## 2015-04-19 DIAGNOSIS — Y9389 Activity, other specified: Secondary | ICD-10-CM | POA: Insufficient documentation

## 2015-04-19 DIAGNOSIS — S79911A Unspecified injury of right hip, initial encounter: Secondary | ICD-10-CM | POA: Insufficient documentation

## 2015-04-19 DIAGNOSIS — M1610 Unilateral primary osteoarthritis, unspecified hip: Secondary | ICD-10-CM

## 2015-04-19 DIAGNOSIS — Z79899 Other long term (current) drug therapy: Secondary | ICD-10-CM | POA: Insufficient documentation

## 2015-04-19 DIAGNOSIS — W19XXXA Unspecified fall, initial encounter: Secondary | ICD-10-CM

## 2015-04-19 DIAGNOSIS — I10 Essential (primary) hypertension: Secondary | ICD-10-CM | POA: Diagnosis not present

## 2015-04-19 HISTORY — DX: Cerebral infarction, unspecified: I63.9

## 2015-04-19 HISTORY — DX: Nonrheumatic mitral (valve) insufficiency: I34.0

## 2015-04-19 MED ORDER — METHOCARBAMOL 500 MG PO TABS: 500.0000 mg | ORAL_TABLET | Freq: Two times a day (BID) | ORAL | Status: DC

## 2015-04-19 MED ORDER — NYSTATIN 100000 UNIT/GM EX POWD: 1.0000 g | Freq: Two times a day (BID) | CUTANEOUS | Status: DC

## 2015-04-19 NOTE — Discharge Instructions (Signed)
Arthritis, Nonspecific °Arthritis is inflammation of a joint. This usually means pain, redness, warmth or swelling are present. One or more joints may be involved. There are a number of types of arthritis. Your caregiver may not be able to tell what type of arthritis you have right away. °CAUSES  °The most common cause of arthritis is the wear and tear on the joint (osteoarthritis). This causes damage to the cartilage, which can break down over time. The knees, hips, back and neck are most often affected by this type of arthritis. °Other types of arthritis and common causes of joint pain include: °· Sprains and other injuries near the joint. Sometimes minor sprains and injuries cause pain and swelling that develop hours later. °· Rheumatoid arthritis. This affects hands, feet and knees. It usually affects both sides of your body at the same time. It is often associated with chronic ailments, fever, weight loss and general weakness. °· Crystal arthritis. Gout and pseudo gout can cause occasional acute severe pain, redness and swelling in the foot, ankle, or knee. °· Infectious arthritis. Bacteria can get into a joint through a break in overlying skin. This can cause infection of the joint. Bacteria and viruses can also spread through the blood and affect your joints. °· Drug, infectious and allergy reactions. Sometimes joints can become mildly painful and slightly swollen with these types of illnesses. °SYMPTOMS  °· Pain is the main symptom. °· Your joint or joints can also be red, swollen and warm or hot to the touch. °· You may have a fever with certain types of arthritis, or even feel overall ill. °· The joint with arthritis will hurt with movement. Stiffness is present with some types of arthritis. °DIAGNOSIS  °Your caregiver will suspect arthritis based on your description of your symptoms and on your exam. Testing may be needed to find the type of arthritis: °· Blood and sometimes urine tests. °· X-ray tests  and sometimes CT or MRI scans. °· Removal of fluid from the joint (arthrocentesis) is done to check for bacteria, crystals or other causes. Your caregiver (or a specialist) will numb the area over the joint with a local anesthetic, and use a needle to remove joint fluid for examination. This procedure is only minimally uncomfortable. °· Even with these tests, your caregiver may not be able to tell what kind of arthritis you have. Consultation with a specialist (rheumatologist) may be helpful. °TREATMENT  °Your caregiver will discuss with you treatment specific to your type of arthritis. If the specific type cannot be determined, then the following general recommendations may apply. °Treatment of severe joint pain includes: °· Rest. °· Elevation. °· Anti-inflammatory medication (for example, ibuprofen) may be prescribed. Avoiding activities that cause increased pain. °· Only take over-the-counter or prescription medicines for pain and discomfort as recommended by your caregiver. °· Cold packs over an inflamed joint may be used for 10 to 15 minutes every hour. Hot packs sometimes feel better, but do not use overnight. Do not use hot packs if you are diabetic without your caregiver's permission. °· A cortisone shot into arthritic joints may help reduce pain and swelling. °· Any acute arthritis that gets worse over the next 1 to 2 days needs to be looked at to be sure there is no joint infection. °Long-term arthritis treatment involves modifying activities and lifestyle to reduce joint stress jarring. This can include weight loss. Also, exercise is needed to nourish the joint cartilage and remove waste. This helps keep the muscles   around the joint strong. °HOME CARE INSTRUCTIONS  °· Do not take aspirin to relieve pain if gout is suspected. This elevates uric acid levels. °· Only take over-the-counter or prescription medicines for pain, discomfort or fever as directed by your caregiver. °· Rest the joint as much as  possible. °· If your joint is swollen, keep it elevated. °· Use crutches if the painful joint is in your leg. °· Drinking plenty of fluids may help for certain types of arthritis. °· Follow your caregiver's dietary instructions. °· Try low-impact exercise such as: °¨ Swimming. °¨ Water aerobics. °¨ Biking. °¨ Walking. °· Morning stiffness is often relieved by a warm shower. °· Put your joints through regular range-of-motion. °SEEK MEDICAL CARE IF:  °· You do not feel better in 24 hours or are getting worse. °· You have side effects to medications, or are not getting better with treatment. °SEEK IMMEDIATE MEDICAL CARE IF:  °· You have a fever. °· You develop severe joint pain, swelling or redness. °· Many joints are involved and become painful and swollen. °· There is severe back pain and/or leg weakness. °· You have loss of bowel or bladder control. °Document Released: 09/13/2004 Document Revised: 10/29/2011 Document Reviewed: 09/29/2008 °ExitCare® Patient Information ©2015 [employer]. This information is not intended to replace advice given to you by your health care provider. Make sure you discuss any questions you have with your health care provider. ° °

## 2015-04-19 NOTE — ED Notes (Signed)
Mechanical fall with injury to R leg last week.  Patient states the pain was in her R calf for a while, but now has the entire R leg/R hip hurting.   Patient denies other symptoms.

## 2015-04-19 NOTE — ED Provider Notes (Signed)
CSN: [id number]     Arrival date & time 04/19/15  1325 History  This chart was scribed for non-physician practitioner, Glendell Docker, NP working with Leo Grosser, MD, by Erling Conte, ED Scribe. This patient was seen in room TR10C/TR10C and the patient's care was started at 1:49 PM.     Chief Complaint  Patient presents with  . Fall    The history is provided by the patient. No language interpreter was used.    HPI Comments: Donna Nguyen is a 74 y.o. female who presents to the Emergency Department complaining of constant, mild, right hip pain s/p fall onset 1 week ago. Pt states the mechanism of fall was from a ladder. She notes the pain radiates down her entire right leg and into her right calf. She reports associated low back pain. Pt admits she has a h/o back pain due to a disc issue. She denies any head injury or LOC. Pt is ambulatory. She denies any joint swelling, leg swelling, numbness, or weakness.   Past Medical History  Diagnosis Date  . Diabetes mellitus without complication   . Hypertension   . MI (mitral incompetence)   . Stroke    Past Surgical History  Procedure Laterality Date  . Bladdee    . Bladder surgery    . Mole removal     No family history on file. Social History  Substance Use Topics  . Smoking status: Never Smoker   . Smokeless tobacco: None  . Alcohol Use: Yes     Comment: socially   OB History    No data available     Review of Systems  Musculoskeletal: Positive for myalgias, back pain and arthralgias. Negative for joint swelling and gait problem.  Neurological: Negative for weakness and numbness.  All other systems reviewed and are negative.     Allergies  Amitriptyline; Ciprofloxacin; and Epinephrine  Home Medications   Prior to Admission medications   Medication Sig Start Date End Date Taking? Authorizing Provider  albuterol (PROVENTIL HFA;VENTOLIN HFA) 108 (90 BASE) MCG/ACT inhaler Inhale 1-2 puffs into the lungs every 6  (six) hours as needed for wheezing or shortness of breath. Patient not taking: Reported on 12/29/2014 09/12/13   Harden Mo, MD  amoxicillin-clavulanate (AUGMENTIN) 875-125 MG per tablet Take 1 tablet by mouth 2 (two) times daily. One po bid x 7 days 04/14/15   Carman Ching, PA-C  gabapentin (NEURONTIN) 100 MG capsule Take 1 capsule (100 mg total) by mouth 3 (three) times daily. Patient not taking: Reported on 12/29/2014 03/10/14   Billy Fischer, MD  glimepiride (AMARYL) 2 MG tablet Take 1 tablet (2 mg total) by mouth daily with breakfast. Patient not taking: Reported on 04/14/2015 03/14/15   Lance Bosch, NP  hydrocortisone cream 1 % Place 1 application vaginally daily as needed for itching.    Historical Provider, MD  ibuprofen (ADVIL,MOTRIN) 800 MG tablet Take 1 tablet (800 mg total) by mouth 3 (three) times daily. 04/14/15   Carman Ching, PA-C  metFORMIN (GLUCOPHAGE) 500 MG tablet Take 1 tablet (500 mg total) by mouth 2 (two) times daily with a meal. Patient not taking: Reported on 12/29/2014 01/05/14   Harden Mo, MD  neomycin-polymyxin-pramoxine (NEOSPORIN PLUS) 1 % cream Place 1 application vaginally daily as needed (UTI).    Historical Provider, MD  nystatin (MYCOSTATIN/NYSTOP) 100000 UNIT/GM POWD Apply to affected area twice daily. 04/14/15   Carman Ching, PA-C  ranitidine (ZANTAC) 150 MG capsule  Take 150 mg by mouth 2 (two) times daily.    Historical Provider, MD   Triage Vitals: BP 145/58 mmHg  Pulse 102  Temp(Src) 97.3 F (36.3 C) (Oral)  Resp 16  Ht 5\' 4"  (1.626 m)  Wt 195 lb (88.451 kg)  BMI 33.46 kg/m2  SpO2 97%  Physical Exam  Constitutional: She is oriented to person, place, and time. She appears well-developed and well-nourished. No distress.  HENT:  Head: Normocephalic and atraumatic.  Eyes: Conjunctivae and EOM are normal.  Neck: Neck supple. No tracheal deviation present.  Cardiovascular: Normal rate.   Pulmonary/Chest: Effort normal. No respiratory distress.   Musculoskeletal: Normal range of motion.  Right Hip: No shortening or rotation. Full ROM tender to groin No swelling noted to lower extremities.  Neurological: She is alert and oriented to person, place, and time.  Skin: Skin is warm and dry.  Psychiatric: She has a normal mood and affect. Her behavior is normal.  Nursing note and vitals reviewed.   ED Course  Procedures (including critical care time)  DIAGNOSTIC STUDIES: Oxygen Saturation is 97% on RA, normal by my interpretation.    COORDINATION OF CARE: 1:56 PM- Pt advised of plan for treatment and pt agrees. Will order right unilateral hip x-ray and lumbar spine.   3:17 PM- Pt advised of plan for treatment and pt agrees. Will d/c pt home with nystatin and Robaxin    Labs Review Labs Reviewed - No data to display  Imaging Review Dg Lumbar Spine Complete  04/19/2015   CLINICAL DATA:  Status post fall from a ladder 1 week ago. The patient landed on her low back. Low back and right hip pain. Initial encounter.  EXAM: LUMBAR SPINE - COMPLETE 4+ VIEW  COMPARISON:  None.  FINDINGS: No fracture or malalignment is identified. There is convex right scoliosis. Marked loss of disc space height and endplate spurring are seen at all levels of the lumbar spine. Facet degenerative change appears worst at L4-5 and L5-S1. There is partial visualization of right worse than left hip osteoarthritis.  IMPRESSION: No acute abnormality.  Convex right scoliosis and marked multilevel spondylosis.  Right worse than left hip osteoarthritis.   Electronically Signed   By: Inge Rise M.D.   On: 04/19/2015 14:44   Dg Hip Unilat With Pelvis 2-3 Views Right  04/19/2015   CLINICAL DATA:  74 year old who fell off of a ladder six days ago, landing on the low back and right hip. Persistent right low back pain and right posterior hip pain. Initial encounter. 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  EXAM: DG HIP (WITH OR WITHOUT PELVIS) 2-3V RIGHT  COMPARISON:   None.  FINDINGS: No evidence of acute fracture or dislocation. Severe diffuse joint space narrowing with associated hypertrophic spurring involving the femoral head and the acetabular roof. Calcified loose bodies suspected within the joint. Bone mineral density well-preserved for age.  Included AP pelvis demonstrates moderate narrowing of the joint space of the contralateral left hip. Sacroiliac joints and symphysis pubis intact. Degenerative changes involving the visualized lower lumbar spine.  IMPRESSION: 1. No acute or subacute osseous abnormality. 2. Severe osteoarthritis involving the right hip. Possible calcified loose bodies within the joint 3. Moderate osteoarthritis involving the contralateral left hip.   Electronically Signed   By: Evangeline Dakin M.D.   On: 04/19/2015 14:44   I have personally reviewed and evaluated these images and lab results as part of my medical decision-making.   EKG Interpretation None  MDM   Final diagnoses:  Arthritis, hip  Fall, initial encounter    No acute bony injury noted. Pt neurologically intact. Pt requesting nystatin as she lost her script  I personally performed the services described in this documentation, which was scribed in my presence. The recorded information has been reviewed and is accurate.    Glendell Docker, NP 04/19/15 [place]  Leo Grosser, MD 04/22/15 [phone]

## 2015-05-20 ENCOUNTER — Ambulatory Visit (INDEPENDENT_AMBULATORY_CARE_PROVIDER_SITE_OTHER): Payer: Commercial Managed Care - HMO

## 2015-05-20 ENCOUNTER — Ambulatory Visit (INDEPENDENT_AMBULATORY_CARE_PROVIDER_SITE_OTHER): Payer: Commercial Managed Care - HMO | Admitting: Family Medicine

## 2015-05-20 VITALS — BP 151/72 | HR 94 | Temp 98.0°F | Resp 17 | Ht 64.5 in | Wt 188.0 lb

## 2015-05-20 DIAGNOSIS — E1165 Type 2 diabetes mellitus with hyperglycemia: Secondary | ICD-10-CM

## 2015-05-20 DIAGNOSIS — E118 Type 2 diabetes mellitus with unspecified complications: Secondary | ICD-10-CM

## 2015-05-20 DIAGNOSIS — M25571 Pain in right ankle and joints of right foot: Secondary | ICD-10-CM | POA: Diagnosis not present

## 2015-05-20 DIAGNOSIS — S99911A Unspecified injury of right ankle, initial encounter: Secondary | ICD-10-CM

## 2015-05-20 DIAGNOSIS — R42 Dizziness and giddiness: Secondary | ICD-10-CM | POA: Diagnosis not present

## 2015-05-20 DIAGNOSIS — IMO0002 Reserved for concepts with insufficient information to code with codable children: Secondary | ICD-10-CM

## 2015-05-20 MED ORDER — TRAMADOL HCL 50 MG PO TABS: 50.0000 mg | ORAL_TABLET | Freq: Four times a day (QID) | ORAL | Status: DC | PRN

## 2015-05-20 MED ORDER — GLIPIZIDE 5 MG PO TABS: 5.0000 mg | ORAL_TABLET | Freq: Two times a day (BID) | ORAL | Status: DC

## 2015-05-20 NOTE — Patient Instructions (Addendum)
We will try glipizide to get your blood sugar under control.   Start with one tablet a day- we may end up increasing to a tablet twice a day later on  Use the walking boot as needed for support You may use tramadol as needed for pain but remember this will make you sleepy Please see [place] in 1-2 weeks for a recheck
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
## 2015-06-01 ENCOUNTER — Telehealth: Payer: Self-pay

## 2015-06-01 DIAGNOSIS — M25571 Pain in right ankle and joints of right foot: Secondary | ICD-10-CM

## 2015-06-01 NOTE — Telephone Encounter (Signed)
Pt in need of Tramadol 50 mg, was told by the Dr that's all she need to do. Please call [place]

## 2015-06-02 MED ORDER — TRAMADOL HCL 50 MG PO TABS: 50.0000 mg | ORAL_TABLET | Freq: Four times a day (QID) | ORAL | Status: DC | PRN

## 2015-06-03 NOTE — Telephone Encounter (Signed)
Patient picked up her prescription and it only had one pill in the bottle.   She is going to call the pharmacy to see why the full bottle was not in there.

## 2015-06-03 NOTE — Telephone Encounter (Signed)
Received a call from Kristopher Oppenheim Pharmacist stating the Rx shows Tramadol with Qty of 4 instead of 40.Marland Kitchen Please call to correct.   Kristopher Oppenheim [phone]

## 2015-06-04 NOTE — Telephone Encounter (Signed)
Left message on voice mail (pharmacy is currently closed for lunch until 2;30 pm) that the intended number of tablets on the prescription is #40.  Spoke with patient to advise.

## 2015-06-04 NOTE — Telephone Encounter (Signed)
Pt is checking on status of the correction of the tramadol rx

## 2015-06-15 ENCOUNTER — Ambulatory Visit (INDEPENDENT_AMBULATORY_CARE_PROVIDER_SITE_OTHER): Payer: Commercial Managed Care - HMO

## 2015-06-15 ENCOUNTER — Ambulatory Visit (INDEPENDENT_AMBULATORY_CARE_PROVIDER_SITE_OTHER): Payer: Commercial Managed Care - HMO | Admitting: Emergency Medicine

## 2015-06-15 VITALS — BP 152/84 | HR 82 | Temp 97.9°F | Resp 16 | Ht 64.5 in

## 2015-06-15 DIAGNOSIS — M25571 Pain in right ankle and joints of right foot: Secondary | ICD-10-CM

## 2015-06-15 DIAGNOSIS — Z9119 Patient's noncompliance with other medical treatment and regimen: Secondary | ICD-10-CM | POA: Diagnosis not present

## 2015-06-15 DIAGNOSIS — Z91199 Patient's noncompliance with other medical treatment and regimen due to unspecified reason: Secondary | ICD-10-CM

## 2015-06-15 DIAGNOSIS — E1165 Type 2 diabetes mellitus with hyperglycemia: Secondary | ICD-10-CM | POA: Diagnosis not present

## 2015-06-15 DIAGNOSIS — R609 Edema, unspecified: Secondary | ICD-10-CM | POA: Diagnosis not present

## 2015-06-15 DIAGNOSIS — E118 Type 2 diabetes mellitus with unspecified complications: Secondary | ICD-10-CM

## 2015-06-15 DIAGNOSIS — IMO0002 Reserved for concepts with insufficient information to code with codable children: Secondary | ICD-10-CM

## 2015-06-15 MED ORDER — HYDROCHLOROTHIAZIDE 25 MG PO TABS: 25.0000 mg | ORAL_TABLET | Freq: Every day | ORAL | Status: DC

## 2015-06-15 MED ORDER — HYDROMORPHONE HCL 2 MG PO TABS: 1.0000 mg | ORAL_TABLET | ORAL | Status: DC | PRN

## 2015-06-15 NOTE — Patient Instructions (Addendum)
We recommend that you schedule a mammogram for breast cancer screening. Typically, you do not need a referral to do this. Please contact a local imaging center to schedule your mammogram.  [facility] - [phone]  *ask for the Radiology [place] ([place]) - [phone] or [phone]  [facility] - [phone] [place] [phone] [facility] - [phone]  *ask for the [facility] - [phone]  *ask for the Radiology Department [facility] - [phone]  *ask for the Mammography Department [facility] - [phone]  Type 2 Diabetes Mellitus, Adult Type 2 diabetes mellitus, often simply referred to as type 2 diabetes, is a long-lasting (chronic) disease. In type 2 diabetes, the pancreas does not make enough insulin (a hormone), the cells are less responsive to the insulin that is made (insulin resistance), or both. Normally, insulin moves sugars from food into the tissue cells. The tissue cells use the sugars for energy. The lack of insulin or the lack of normal response to insulin causes excess sugars to build up in the blood instead of going into the tissue cells. As a result, high blood sugar (hyperglycemia) develops. The effect of high sugar (glucose) levels can cause many complications. Type 2 diabetes was also previously called adult-onset diabetes, but it can occur at any age.  RISK FACTORS  A person is predisposed to developing type 2 diabetes if someone in the family has the disease and also has one or more of the following primary risk factors:  Weight gain, or being overweight or obese.  An inactive lifestyle.  A history of consistently eating high-calorie foods. Maintaining a normal weight and regular physical activity can reduce the chance of developing type 2 diabetes. SYMPTOMS  A person with type 2 diabetes may not show  symptoms initially. The symptoms of type 2 diabetes appear slowly. The symptoms include:  Increased thirst (polydipsia).  Increased urination (polyuria).  Increased urination during the night (nocturia).  Sudden or unexplained weight changes.  Frequent, recurring infections.  Tiredness (fatigue).  Weakness.  Vision changes, such as blurred vision.  Fruity smell to your breath.  Abdominal pain.  Nausea or vomiting.  Cuts or bruises which are slow to heal.  Tingling or numbness in the hands or feet.  An open skin wound (ulcer). DIAGNOSIS Type 2 diabetes is frequently not diagnosed until complications of diabetes are present. Type 2 diabetes is diagnosed when symptoms or complications are present and when blood glucose levels are increased. Your blood glucose level may be checked by one or more of the following blood tests:  A fasting blood glucose test. You will not be allowed to eat for at least 8 hours before a blood sample is taken.  A random blood glucose test. Your blood glucose is checked at any time of the day regardless of when you ate.  A hemoglobin A1c blood glucose test. A hemoglobin A1c test provides information about blood glucose control over the previous 3 months.  An oral glucose tolerance test (OGTT). Your blood glucose is measured after you have not eaten (fasted) for 2 hours and then after you drink a glucose-containing beverage. TREATMENT   You may need to take insulin or diabetes medicine daily to keep blood glucose levels in the desired range.  If you use insulin, you may need to adjust the dosage depending on the carbohydrates that you eat with each meal or snack.  Lifestyle changes are recommended as part of your treatment. These may include:  Following an individualized diet plan developed by a nutritionist or dietitian.  Exercising daily. Your health care providers will set individualized treatment goals for you based on your age, your  medicines, how long you have had diabetes, and any other medical conditions you have. Generally, the goal of treatment is to maintain the following blood glucose levels:  Before meals (preprandial): 80-130 mg/dL.  After meals (postprandial): below 180 mg/dL.  A1c: less than 6.5-7%. HOME CARE INSTRUCTIONS   Have your hemoglobin A1c level checked twice a year.  Perform daily blood glucose monitoring as directed by your health care provider.  Monitor urine ketones when you are ill and as directed by your health care provider.  Take your diabetes medicine or insulin as directed by your health care provider to maintain your blood glucose levels in the desired range.  Never run out of diabetes medicine or insulin. It is needed every day.  If you are using insulin, you may need to adjust the amount of insulin given based on your intake of carbohydrates. Carbohydrates can raise blood glucose levels but need to be included in your diet. Carbohydrates provide vitamins, minerals, and fiber which are an essential part of a healthy diet. Carbohydrates are found in fruits, vegetables, whole grains, dairy products, legumes, and foods containing added sugars.  Eat healthy foods. You should make an appointment to see a registered dietitian to help you create an eating plan that is right for you.  Lose weight if you are overweight.  Carry a medical alert card or wear your medical alert jewelry.  Carry a 15-gram carbohydrate snack with you at all times to treat low blood glucose (hypoglycemia). Some examples of 15-gram carbohydrate snacks include:  Glucose tablets, 3 or 4.  Glucose gel, 15-gram tube.  Raisins, 2 tablespoons (24 grams).  Jelly beans, 6.  Animal crackers, 8.  Regular pop, 4 ounces (120 mL).  Gummy treats, 9.  Recognize hypoglycemia. Hypoglycemia occurs with blood glucose levels of 70 mg/dL and below. The risk for hypoglycemia increases when fasting or skipping meals, during or  after intense exercise, and during sleep. Hypoglycemia symptoms can include:  Tremors or shakes.  Decreased ability to concentrate.  Sweating.  Increased heart rate.  Headache.  Dry mouth.  Hunger.  Irritability.  Anxiety.  Restless sleep.  Altered speech or coordination.  Confusion.  Treat hypoglycemia promptly. If you are alert and able to safely swallow, follow the 15:15 rule:  Take 15-20 grams of rapid-acting glucose or carbohydrate. Rapid-acting options include glucose gel, glucose tablets, or 4 ounces (120 mL) of fruit juice, regular soda, or low-fat milk.  Check your blood glucose level 15 minutes after taking the glucose.  Take 15-20 grams more of glucose if the repeat blood glucose level is still 70 mg/dL or below.  Eat a meal or snack within 1 hour once blood glucose levels return to normal.  Be alert to feeling very thirsty and urinating more frequently than usual, which are early signs of hyperglycemia. An early awareness of hyperglycemia allows for prompt treatment. Treat hyperglycemia as directed by your health care provider.  Engage in at least 150 minutes of moderate-intensity physical activity a week, spread over at least 3 days of the week or as directed by your health care provider. In addition, you should engage in resistance exercise at least 2 times a week or as directed by your health care provider. Try to spend  no more than 90 minutes at one time inactive.  Adjust your medicine and food intake as needed if you start a new exercise or sport.  Follow your sick-day plan anytime you are unable to eat or drink as usual.  Do not use any tobacco products including cigarettes, chewing tobacco, or electronic cigarettes. If you need help quitting, ask your health care provider.  Limit alcohol intake to no more than 1 drink per day for nonpregnant women and 2 drinks per day for men. You should drink alcohol only when you are also eating food. Talk with your  health care provider whether alcohol is safe for you. Tell your health care provider if you drink alcohol several times a week.  Keep all follow-up visits as directed by your health care provider. This is important.  Schedule an eye exam soon after the diagnosis of type 2 diabetes and then annually.  Perform daily skin and foot care. Examine your skin and feet daily for cuts, bruises, redness, nail problems, bleeding, blisters, or sores. A foot exam by a health care provider should be done annually.  Brush your teeth and gums at least twice a day and floss at least once a day. Follow up with your dentist regularly.  Share your diabetes management plan with your workplace or school.  Keep your immunizations up to date. It is recommended that you receive a flu (influenza) vaccine every year. It is also recommended that you receive a pneumonia (pneumococcal) vaccine. If you are 50 years of age or older and have never received a pneumonia vaccine, this vaccine may be given as a series of two separate shots. Ask your health care provider which additional vaccines may be recommended.  Learn to manage stress.  Obtain ongoing diabetes education and support as needed.  Participate in or seek rehabilitation as needed to maintain or improve independence and quality of life. Request a physical or occupational therapy referral if you are having foot or hand numbness, or difficulties with grooming, dressing, eating, or physical activity. SEEK MEDICAL CARE IF:   You are unable to eat food or drink fluids for more than 6 hours.  You have nausea and vomiting for more than 6 hours.  Your blood glucose level is over 240 mg/dL.  There is a change in mental status.  You develop an additional serious illness.  You have diarrhea for more than 6 hours.  You have been sick or have had a fever for a couple of days and are not getting better.  You have pain during any physical activity.  SEEK IMMEDIATE  MEDICAL CARE IF:  You have difficulty breathing.  You have moderate to large ketone levels.   This information is not intended to replace advice given to you by your health care provider. Make sure you discuss any questions you have with your health care provider.   Document Released: 08/06/2005 Document Revised: 04/27/2015 Document Reviewed: 03/04/2012 Elsevier Interactive Patient Education 2016 [place]

## 2015-06-15 NOTE — Progress Notes (Addendum)
Subjective:  Patient ID: Donna Nguyen, female    DOB: [birth year]  Age: 74 y.o. MRN: [id number]  CC: Fall   HPI Donna Nguyen presents  with a number complaints. She has type 2 diabetes but does not take her medication. Because she is afraid of adverse effects. Also is deathly afraid of needles. She will not take her blood sugar at home and she will not permit blood be drawn here. She was seen in September by Dr. Edilia Bo and was advised to take a diabetic medicine she's not on that. Her hypertension and will not take medication for that either. She is injured her right ankle recently and that led to her visit in September Dr. Edilia Bo put her on tramadol and taking 2 tramadol every 3 hours for pain as well as taking over-the-counter ibuprofen. Says the pain is barely tolerable level. She recently was evicted from her house and is living her Donna Nguyen. She has 2 dogs with her living in the band. That prevents her from going to a homeless shelter. 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 Edilia Bo treat her for previously also has noted that she has developed bilateral 2+ pitting edema to her knees. Denies any orthopnea or nocturnal dyspnea. She has frequent nocturia and on arising she says the swelling is gone. It really accumulates during the course of the day. She denies any chest pain tightness heaviness pressure shortness breath or wheezing on one of her most recent fall she struck her head is been having persistent headache since and she had no loss consciousness. She has no other neurologic or visual symptoms. But for the headache and the dizziness preceded the fall.    History Donna Nguyen has a past medical history of Diabetes mellitus without complication ([place]); Hypertension; MI (mitral incompetence); and Stroke ([place]).   She has past surgical history that includes bladdee; Bladder surgery; and Mole removal.   Her  family history is not on file.  She   reports  that she has never smoked. She does not have any smokeless tobacco history on file. She reports that she drinks alcohol. She reports that she does not use illicit drugs.  Outpatient Prescriptions Prior to Visit  Medication Sig Dispense Refill  . ibuprofen (ADVIL,MOTRIN) 800 MG tablet Take 1 tablet (800 mg total) by mouth 3 (three) times daily. 21 tablet 0  . traMADol (ULTRAM) 50 MG tablet Take 1 tablet (50 mg total) by mouth every 6 (six) hours as needed. 40 tablet 0  . glipiZIDE (GLUCOTROL) 5 MG tablet Take 1 tablet (5 mg total) by mouth 2 (two) times daily before a meal. (Patient not taking: Reported on 06/15/2015) 60 tablet 3  . hydrocortisone cream 1 % Place 1 application vaginally daily as needed for itching.    . neomycin-polymyxin-pramoxine (NEOSPORIN PLUS) 1 % cream Place 1 application vaginally daily as needed (UTI).    . NYSTATIN, TOPICAL, POWD Apply topically.     No facility-administered medications prior to visit.    Social History   Social History  . Marital Status: Divorced    Spouse Name: N/A  . Number of Children: N/A  . Years of Education: N/A   Social History Main Topics  . Smoking status: Never Smoker   . Smokeless tobacco: None  . Alcohol Use: Yes     Comment: socially  . Drug Use: No  . Sexual Activity: Yes    Birth Control/ Protection: Post-menopausal   Other Topics Concern  .  None   Social History Narrative     Review of Systems  Constitutional: Negative for fever, chills and appetite change.  HENT: Negative for congestion, ear pain, postnasal drip, sinus pressure and sore throat.   Eyes: Negative for pain and redness.  Respiratory: Negative for cough, shortness of breath and wheezing.   Cardiovascular: Positive for leg swelling.  Gastrointestinal: Negative for nausea, vomiting, abdominal pain, diarrhea, constipation and blood in stool.  Endocrine: Negative for polyuria.  Genitourinary: Negative for dysuria, urgency, frequency and flank pain.    Musculoskeletal: Positive for gait problem.  Skin: Negative for rash.  Neurological: Positive for dizziness and headaches. Negative for weakness.  Psychiatric/Behavioral: Negative for confusion and decreased concentration. The patient is not nervous/anxious.     Objective:  BP 152/84 mmHg  Pulse 82  Temp(Src) 97.9 F (36.6 C) (Oral)  Resp 16  Ht 5' 4.5" (1.638 m)  SpO2 98%  Physical Exam  Constitutional: She is oriented to person, place, and time. She appears well-developed and well-nourished. No distress.  HENT:  Head: Normocephalic and atraumatic.  Right Ear: External ear normal.  Left Ear: External ear normal.  Nose: Nose normal.  Eyes: Conjunctivae and EOM are normal. Pupils are equal, round, and reactive to light. No scleral icterus.  Neck: Normal range of motion. Neck supple. No tracheal deviation present.  Cardiovascular: Normal rate, regular rhythm and normal heart sounds.   Pulmonary/Chest: Effort normal. No respiratory distress. She has no wheezes. She has no rales.  Abdominal: She exhibits no mass. There is no tenderness. There is no rebound and no guarding.  Musculoskeletal: She exhibits edema.       Right ankle: She exhibits swelling. Tenderness.  Lymphadenopathy:    She has no cervical adenopathy.  Neurological: She is alert and oriented to person, place, and time. Coordination normal.  Skin: Skin is warm and dry. No rash noted.  Psychiatric: She has a normal mood and affect. Her behavior is normal.      Assessment & Plan:   Donna Nguyen was seen today for fall.  Diagnoses and all orders for this visit:  Right ankle pain -     DG Ankle Complete Right; Future  Uncontrolled type 2 diabetes mellitus with complication, without long-term current use of insulin (HCC) -     Cancel: POCT glycosylated hemoglobin (Hb A1C)  Edema, unspecified type  Noncompliance  Other orders -     hydrochlorothiazide (HYDRODIURIL) 25 MG tablet; Take 1 tablet (25 mg total) by  mouth daily. -     HYDROmorphone (DILAUDID) 2 MG tablet; Take 0.5-1 tablets (1-2 mg total) by mouth every 4 (four) hours as needed for severe pain.   I am having Donna Nguyen start on hydrochlorothiazide and HYDROmorphone. I am also having her maintain her hydrocortisone cream, neomycin-polymyxin-pramoxine, ibuprofen, NYSTATIN (TOPICAL), glipiZIDE, traMADol, and Omeprazole (PRILOSEC PO).  Meds ordered this encounter  Medications  . Omeprazole (PRILOSEC PO)    Sig: Take by mouth.  . hydrochlorothiazide (HYDRODIURIL) 25 MG tablet    Sig: Take 1 tablet (25 mg total) by mouth daily.    Dispense:  30 tablet    Refill:  3  . HYDROmorphone (DILAUDID) 2 MG tablet    Sig: Take 0.5-1 tablets (1-2 mg total) by mouth every 4 (four) hours as needed for severe pain.    Dispense:  30 tablet    Refill:  0    I attempted to get her to have a CAT scan of her head which he refused.  Understands there is a risk of adverse neurologic consequences or death from my family to have that scan. She also refused to have any lab work done to check status of her diabetes. She's refused to take the medication. I asked spent significant amount of time talking with her about the need to take both her antihypertensive and her diabetes medication to no avail. She is adamantly opposed take any medication. Also instructed her that she cannot take tramadol in such a high quantity nor can she take a nonsteroidal anti-inflammatory for a higher dose for a protracted period of time.   Appropriate red flag conditions were discussed with the patient as well as actions that should be taken.  Patient expressed his understanding.  Follow-up: Return in about 1 week (around 06/22/2015), or if symptoms worsen or fail to improve.  Roselee Culver, MD    [facility] reading (PRIMARY) by  Dr. Ouida Sills.  No change.  Some DJD.

## 2015-06-18 ENCOUNTER — Ambulatory Visit (INDEPENDENT_AMBULATORY_CARE_PROVIDER_SITE_OTHER): Payer: Commercial Managed Care - HMO | Admitting: Family Medicine

## 2015-06-18 VITALS — BP 140/80 | HR 115 | Temp 97.7°F | Resp 16 | Ht 64.5 in | Wt 179.0 lb

## 2015-06-18 DIAGNOSIS — E1149 Type 2 diabetes mellitus with other diabetic neurological complication: Secondary | ICD-10-CM | POA: Diagnosis not present

## 2015-06-18 DIAGNOSIS — R413 Other amnesia: Secondary | ICD-10-CM

## 2015-06-18 DIAGNOSIS — E1122 Type 2 diabetes mellitus with diabetic chronic kidney disease: Secondary | ICD-10-CM

## 2015-06-18 DIAGNOSIS — M25571 Pain in right ankle and joints of right foot: Secondary | ICD-10-CM | POA: Diagnosis not present

## 2015-06-18 DIAGNOSIS — N182 Chronic kidney disease, stage 2 (mild): Secondary | ICD-10-CM

## 2015-06-18 DIAGNOSIS — Z9114 Patient's other noncompliance with medication regimen: Secondary | ICD-10-CM

## 2015-06-18 MED ORDER — GABAPENTIN 300 MG PO CAPS: 300.0000 mg | ORAL_CAPSULE | Freq: Every day | ORAL | Status: DC

## 2015-06-18 MED ORDER — TRAMADOL HCL 50 MG PO TABS: 50.0000 mg | ORAL_TABLET | Freq: Four times a day (QID) | ORAL | Status: DC | PRN

## 2015-06-18 MED ORDER — TRAMADOL-ACETAMINOPHEN 37.5-325 MG PO TABS: 1.0000 | ORAL_TABLET | Freq: Three times a day (TID) | ORAL | Status: DC | PRN

## 2015-06-18 NOTE — Patient Instructions (Addendum)
[facility] Policy for Prescribing Controlled Substances (Revised 06/2012) 1. Prescriptions for controlled substances will be filled by ONE provider at [facility] with whom you have established and developed a plan for your care, including follow-up. 2. You are encouraged to schedule an appointment with your prescriber at our appointment center for follow-up visits whenever possible. 3. If you request a prescription for the controlled substance while at [facility] for an acute problem (with someone other than your regular prescriber), you MAY be given a ONE-TIME limited prescription of the controlled substance, to allow time for you to return to see your regular prescriber for additional prescriptions. YOU CANNOT TAKE MORE THAN 8 TABS A DAY OF THE TRAMADOL NO MATTER WHAT. NO IBUPROFEN UNTIL YOU LET us TEST YOUR KIDNEYS. YOU CAN TAKE THE GABAPENTIN AT NIGHT YOU CAN TAKE 1/2 TAB OF THE DILAUDID AS NEEDED BUT DO NOT TAKE IT DURING THE DAY WHEN YOU ON TV OR AT THE COURT.  Diabetic Nephropathy Diabetic nephropathy is a complication of diabetes that leads to damaged kidneys. It develops slowly. The function of healthy kidneys is to filter and clean blood. Kidneys also get rid of body waste products and extra fluid. When the kidney filters are damaged, there is protein loss in the urine, a decline in kidney function, a buildup of kidney waste products and fluid, and high blood pressure. The damage progresses until the kidneys fail.  RISK FACTORS  High blood pressure (hypertension).  High blood sugar (hyperglycemia).  Family history.  Aging.  Obstruction problems affecting the kidneys, the tubes that drain the kidneys (ureters), or the bladder.  Taking certain drugs or medicines. SYMPTOMS  Symptoms may not be seen or felt for many years. You may not notice any signs of kidney failure until your kidneys have lost much of their ability to function. An early sign of damage is when small amounts of protein (albumin) leak  into the urine. However, this can only be found through a urine test. Without physical symptoms, a urine test is often not performed. When the kidneys fail, you may feel one or more of the following:  Swelling of the hands and feet from the extra fluid in your body.  Constant upset stomach.  Constant fatigue. DIAGNOSIS When someone has diabetes, screening tests are done to look for any early signs of problems before symptoms develop and before damage has already been done. These tests may include:   Annual urine tests to screen for trace amounts of protein in the urine (microalbuminuria).  Urine collectionover 24 hours to measure kidney function.  Blood tests that measure kidney function. Your caregiver is aware that problems other than diabetes can damage kidneys. If screening tests show early kidney damage, but it is thought that a different problem is causing the damage, other tests may be performed. Examples of these tests include:  An ultrasound of your kidney.  Taking a tissue sample (biopsy) from the kidney. TREATMENT The goal of treatment is to prevent or slow down damage to your kidneys. Controlling hypertension and hyperglycemia is critical. Your goal is to maintain a blood pressure below 120/80. If you have certain other medical problems, this goal may be different. Talk to your caregiver to make sure that your blood pressure goal is right for your needs. Regular testing of your blood glucose at home is important. Your goal is to have a normal blood glucose (110 or less when fasting) as often as possible.  In addition, maintaining your hemoglobin A1c level at less  than 7% reduces your risk for complications, including kidney damage. Common treatments include:  Dieting by controlling what you eat as well as the portion sizes.  Exercising to control blood pressure and blood glucose.  Losing weight.  Quitting smoking.  Taking medicines.  Giving yourself insulin  injections if your caregiver feels that it is necessary.  Getting early treatment for urinary tract infections.  Regularly following up with your caregiver. If your disease progresses to end-stage kidney failure, you will need dialysis or a transplant. Dialysis can be done in 1 of 2 ways:  Hemodialysis. Your blood flows from a tube in your arm through a machine. The machine filters waste and extra fluid. The clean blood flows back into your arm.  Peritoneal dialysis. Your abdomen is filled with a special fluid. The fluid collects waste products and extra fluid from your blood. The fluid is then drained from your abdomen and discarded. SEEK MEDICAL CARE IF:   You are having problems keeping your blood glucose in the goal range.  You have swelling of the hands or feet.  You have weakness.  You have muscles spasms.  You have a constant upset stomach.  You feel tired all the time and this is not normal for you. SEEK IMMEDIATE MEDICAL CARE IF:  You have unusual dizziness or weakness.  You have excessive sleepiness.  You have a seizure or convulsion.  You have severe, painful muscle spasms.  You have shortness of breath or trouble breathing.  You pass out or have a fainting episode.  You have chest pains. MAKE SURE YOU:  Understand these instructions.  Will watch your condition.  Will get help right away if you are not doing well or get worse.   This information is not intended to replace advice given to you by your health care provider. Make sure you discuss any questions you have with your health care provider.   Document Released: 08/26/2007 Document Revised: 08/27/2014 Document Reviewed: 03/28/2015 Elsevier Interactive Patient Education 2016 [employer]  Diabetic Neuropathy Diabetic neuropathy is a nerve disease or nerve damage that is caused by diabetes mellitus. About half of all people with diabetes mellitus have some form of nerve damage. Nerve damage is more  common in those who have had diabetes mellitus for many years and who generally have not had good control of their blood sugar (glucose) level. Diabetic neuropathy is a common complication of diabetes mellitus. There are three common types of diabetic neuropathy and a fourth type that is less common and less understood:   Peripheral neuropathy--This is the most common type of diabetic neuropathy. It causes damage to the nerves of the feet and legs first and then eventually the hands and arms.The damage affects the ability to sense touch.  Autonomic neuropathy--This type causes damage to the autonomic nervous system, which controls the following functions:  Heartbeat.  Body temperature.  Blood pressure.  Urination.  Digestion.  Sweating.  Sexual function.  Focal neuropathy--Focal neuropathy can be painful and unpredictable and occurs most often in older adults with diabetes mellitus. It involves a specific nerve or one area and often comes on suddenly. It usually does not cause long-term problems.  Radiculoplexus neuropathy-- Sometimes called lumbosacral radiculoplexus neuropathy, radiculoplexus neuropathy affects the nerves of the thighs, hips, buttocks, or legs. It is more common in people with type 2 diabetes mellitus and in older men. It is characterized by debilitating pain, weakness, and atrophy, usually in the thigh muscles. CAUSES  The cause of peripheral,  autonomic, and focal neuropathies is diabetes mellitus that is uncontrolled and high glucose levels. The cause of radiculoplexus neuropathy is unknown. However, it is thought to be caused by inflammation related to uncontrolled glucose levels. SIGNS AND SYMPTOMS  Peripheral Neuropathy Peripheral neuropathy develops slowly over time. When the nerves of the feet and legs no longer work there may be:   Burning, stabbing, or aching pain in the legs or feet.  Inability to feel pressure or pain in your feet. This can lead  to:  Thick calluses over pressure areas.  Pressure sores.  Ulcers.  Foot deformities.  Reduced ability to feel temperature changes.  Muscle weakness. Autonomic Neuropathy The symptoms of autonomic neuropathy vary depending on which nerves are affected. Symptoms may include:  Problems with digestion, such as:  Feeling sick to your stomach (nausea).  Vomiting.  Bloating.  Constipation.  Diarrhea.  Abdominal pain.  Difficulty with urination. This occurs if you lose your ability to sense when your bladder is full. Problems include:  Urine leakage (incontinence).  Inability to empty your bladder completely (retention).  Rapid or irregular heartbeat (palpitations).  Blood pressure drops when you stand up (orthostatic hypotension). When you stand up you may feel:  Dizzy.  Weak.  Faint.  In men, inability to attain and maintain an erection.  In women, vaginal dryness and problems with decreased sexual desire and arousal.  Problems with body temperature regulation.  Increased or decreased sweating. Focal Neuropathy  Abnormal eye movements or abnormal alignment of both eyes.  Weakness in the wrist.  Foot drop. This results in an inability to lift the foot properly and abnormal walking or foot movement.  Paralysis on one side of your face (Bell palsy).  Chest or abdominal pain. Radiculoplexus Neuropathy  Sudden, severe pain in your hip, thigh, or buttocks.  Weakness and wasting of thigh muscles.  Difficulty rising from a seated position.  Abdominal swelling.  Unexplained weight loss (usually more than 10 lb [4.5 kg]). DIAGNOSIS  Peripheral Neuropathy Your senses may be tested. Sensory function testing can be done with:  A light touch using a monofilament.  A vibration with tuning fork.  A sharp sensation with a pin prick. Other tests that can help diagnose neuropathy are:  Nerve conduction velocity. This test checks the transmission of an  electrical current through a nerve.  Electromyography. This shows how muscles respond to electrical signals transmitted by nearby nerves.  Quantitative sensory testing. This is used to assess how your nerves respond to vibrations and changes in temperature. Autonomic Neuropathy Diagnosis is often based on reported symptoms. Tell your health care provider if you experience:   Dizziness.   Constipation.   Diarrhea.   Inappropriate urination or inability to urinate.   Inability to get or maintain an erection.  Tests that may be done include:   Electrocardiography or Holter monitor. These are tests that can help show problems with the heart rate or heart rhythm.   An X-ray exam may be done. Focal Neuropathy Diagnosis is made based on your symptoms and what your health care provider finds during your exam. Other tests may be done. They may include:  Nerve conduction velocities. This checks the transmission of electrical current through a nerve.  Electromyography. This shows how muscles respond to electrical signals transmitted by nearby nerves.  Quantitative sensory testing. This test is used to assess how your nerves respond to vibration and changes in temperature. Radiculoplexus Neuropathy  Often the first thing is to eliminate any other  issue or problems that might be the cause, as there is no stick test for diagnosis.  X-ray exam of your spine and lumbar region.  Spinal tap to rule out cancer.  MRI to rule out other lesions. TREATMENT  Once nerve damage occurs, it cannot be reversed. The goal of treatment is to keep the disease or nerve damage from getting worse and affecting more nerve fibers. Controlling your blood glucose level is the key. Most people with radiculoplexus neuropathy see at least a partial improvement over time. You will need to keep your blood glucose and HbA1c levels in the target range determined by your health care provider. Things that help control  blood glucose levels include:   Blood glucose monitoring.   Meal planning.   Physical activity.   Diabetes medicine.  Over time, maintaining lower blood glucose levels helps lessen symptoms. Sometimes, prescription pain medicine is needed. HOME CARE INSTRUCTIONS:  Do not smoke.  Keep your blood glucose level in the range that you and your health care provider have determined acceptable for you.  Keep your blood pressure level in the range that you and your health care provider have determined acceptable for you.  Eat a well-balanced diet.  Be physically active every day. Include strength training and balance exercises.  Protect your feet.  Check your feet every day for sores, cuts, blisters, or signs of infection.  Wear padded socks and supportive shoes. Use orthotic inserts, if necessary.  Regularly check the insides of your shoes for worn spots. Make sure there are no rocks or other items inside your shoes before you put them on. SEEK MEDICAL CARE IF:   You have burning, stabbing, or aching pain in the legs or feet.  You are unable to feel pressure or pain in your feet.  You develop problems with digestion such as:  Nausea.  Vomiting.  Bloating.  Constipation.  Diarrhea.  Abdominal pain.  You have difficulty with urination, such as:  Incontinence.  Retention.  You have palpitations.  You develop orthostatic hypotension. When you stand up you may feel:  Dizzy.  Weak.  Faint.  You cannot attain and maintain an erection (in men).  You have vaginal dryness and problems with decreased sexual desire and arousal (in women).  You have severe pain in your thighs, legs, or buttocks.  You have unexplained weight loss.   This information is not intended to replace advice given to you by your health care provider. Make sure you discuss any questions you have with your health care provider.   Document Released: 10/15/2001 Document Revised: 08/27/2014  Document Reviewed: 01/15/2013 Elsevier Interactive Patient Education 2016 [place]. Complementary and Alternative Medical Therapies for Diabetes Complementary and alternative medicines are health care practices or products that are not always accepted as part of routine medicine. Complementary medicine is used along with routine medicine (medical therapy). Alternative medicine can sometimes be used instead of routine medicine. Some people use these methods to treat diabetes. While some of these therapies may be effective, others may not be. Some may even be harmful. Patients using these methods need to tell their caregiver. It is important to let your caregivers know what you are doing. Some of these therapies are discussed below. For more information, talk with your caregiver. THERAPIES Acupuncture Acupuncture is done by a professional who inserts needles into certain points on the skin. Some scientists believe that this triggers the release of the body's natural painkillers. It has been shown to relieve long-term (chronic)  pain. This may help patients with painful nerve damage caused by diabetes. Biofeedback Biofeedback helps a person become more aware of the body's response to pain. It also helps you learn to deal with the pain. This alternative therapy focuses on relaxation and stress-reduction techniques. Thinking of peaceful mental images (guided imagery) is one technique. Some people believe these images can ease their condition. MEDICATIONS Chromium Several studies report that chromium supplements may improve diabetes control. Chromium helps insulin improve its action. Research is not yet certain. Supplements have not been recommended or approved. Caution is needed if you have kidney (renal) problems. Ginseng There are several types of ginseng plants. American ginseng is used for diabetes studies. Those studies have shown some glucose-lowering effects. Those effects have been seen with  fasting and after-meal blood glucose levels. They have also been seen in A1c levels (average blood glucose levels over a 70-month period). More long-term studies are needed before recommendations for use of ginseng can be made. Magnesium Experts have studied the relationship between magnesium and diabetes for many years. But it is not yet fully understood. Studies suggest that a low amount of magnesium may make blood glucose control worse in type 2 diabetes. Research also shows that a low amount may contribute to certain diabetes complications. One study showed that people who consume more magnesium had less risk of type 2 diabetes. Eating whole grains, nuts, and green leafy vegetables raises the magnesium level. Vanadium Vanadium is a compound found in tiny amounts in plants and animals. Early studies showed that vanadium improved blood glucose levels in animals with type 1 and type 2 diabetes. One study found that when given vanadium, those with diabetes were able to decrease their insulin dosage. Researchers still need to learn how it works in the body to discover any side effects, and to find safe dosages. Cinnamon There have been a couple of studies that seem to indicate cinnamon decreases insulin resistance and increases insulin production. By doing so, it may lower blood glucose. Exact doses are unknown, but it may work best when used in combination with other diabetes medicines.   This information is not intended to replace advice given to you by your health care provider. Make sure you discuss any questions you have with your health care provider.   Document Released: 06/03/2007 Document Revised: 10/29/2011 Document Reviewed: 06/16/2009 Elsevier Interactive Patient Education [employer].

## 2015-06-18 NOTE — Progress Notes (Addendum)
Subjective:    Patient ID: Donna Nguyen, female    DOB: 1941/04/04, 74 y.o.   MRN: [id number] This chart was scribed for Delman Cheadle, MD by Marti Sleigh, Medical Scribe. This patient was seen in Room 4 and the patient's care was started a 4:12 PM.  Chief Complaint  Patient presents with  . Med Change    States she needs pain medication     HPI HPI Comments: Donna Nguyen is a 74 y.o. female who presents to [facility] complaining of chronic pain. She states she has had three bad falls recently which have left her in constant pain. She states she has been taking tramadol leftover from when her father died, 3-4 tramadol every 4 hours in addition to 3-4 ibuprofen daily - has to use tramadol with ibuprofen to get any sig pain relief. She states she is out of pain medication but needs more in order to make it though the next few weeks.   She also states she is in foreclosure right now, and is having her belongings auctioned out of her old home over the next two weeks and there is an A&E special being filmed on her and her house and belongings. Due to this she wants to have her pain managed in a way that she can be functional, but mobile. She is living in a airstream trailer right now with her three dogs and a mansion full of stuff she keeps on loosing EVErything.Marland Kitchen She was prescribed HCTZ by Dr. Ouida Sills last week, and she has been taking it thinking it was a pain medicine so she went in and asked pharmacist why it wasn't working for her pain and was told it was a BP med.  Was given a paper rx for dilaudid which she still has (and is with her today) but reports the pharmacist told her that she couldn't have both the dilaudid and something else so did not fill (???).   She also states she has historically been a very sensitive patient, and has been generally prescribed pediatric doses of medications. Her prior PCP retried and pt established here with Dr. Lorelei Pont 1 mo prior per pt.  Has taken gabapentin prior  and seems to work well for her.  States tylenol causes her anaphylaxis.    Reports home cbg was 150.  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 Her 2 brothers also have DM but much more severe and insulin dependent.  She lost her glipizide - doesn't think she it but states she is willing to start it   Pt w/ needle phobia so refuses labs but denies any anxiety about checking her cbgs at home - only not doing it because she lost her glucometer.    Past Medical History  Diagnosis Date  . Diabetes mellitus without complication ([place])   . Hypertension   . MI (mitral incompetence)   . Stroke [facility])    Allergies  Allergen Reactions  . Amitriptyline   . Ciprofloxacin   . Epinephrine   . Penicillins    Current Outpatient Prescriptions on File Prior to Visit  Medication Sig Dispense Refill  . hydrochlorothiazide (HYDRODIURIL) 25 MG tablet Take 1 tablet (25 mg total) by mouth daily. 30 tablet 3  . ibuprofen (ADVIL,MOTRIN) 800 MG tablet Take 1 tablet (800 mg total) by mouth 3 (three) times daily. 21 tablet 0  . neomycin-polymyxin-pramoxine (NEOSPORIN PLUS) 1 % cream Place 1 application  vaginally daily as needed (UTI).    . NYSTATIN, TOPICAL, POWD Apply topically.    . Omeprazole (PRILOSEC PO) Take by mouth.    . traMADol (ULTRAM) 50 MG tablet Take 1 tablet (50 mg total) by mouth every 6 (six) hours as needed. 40 tablet 0  . glipiZIDE (GLUCOTROL) 5 MG tablet Take 1 tablet (5 mg total) by mouth 2 (two) times daily before a meal. (Patient not taking: Reported on 06/15/2015) 60 tablet 3  . HYDROmorphone (DILAUDID) 2 MG tablet Take 0.5-1 tablets (1-2 mg total) by mouth every 4 (four) hours as needed for severe pain. (Patient not taking: Reported on 06/18/2015) 30 tablet 0   No current facility-administered medications on file prior to visit.   Depression screen [facility] 2/9 06/18/2015 03/14/2015  Decreased  Interest 0 3  Down, Depressed, Hopeless 0 3  PHQ - 2 Score 0 6  Altered sleeping - 2  Tired, decreased energy - 3  Change in appetite - 1  Feeling bad or failure about yourself  - 1  Trouble concentrating - 1  Moving slowly or fidgety/restless - 1  Suicidal thoughts - 0  PHQ-9 Score - 15     Review of Systems  Constitutional: Positive for fatigue. Negative for fever, chills, activity change, appetite change and unexpected weight change.  Cardiovascular: Positive for leg swelling.  Endocrine: Positive for polyuria. Negative for polydipsia and polyphagia.  Genitourinary: Positive for urgency, frequency and enuresis. Negative for difficulty urinating.  Musculoskeletal: Positive for myalgias, back pain, joint swelling, arthralgias and gait problem.  Neurological: Positive for weakness, light-headedness and numbness. Negative for dizziness, syncope and facial asymmetry.  Psychiatric/Behavioral: Positive for behavioral problems, confusion, decreased concentration and agitation. Negative for hallucinations and dysphoric mood.      Objective:  BP 140/80 mmHg  Pulse 115  Temp(Src) 97.7 F (36.5 C) (Oral)  Resp 16  Ht 5' 4.5" (1.638 m)  Wt 179 lb (81.194 kg)  BMI 30.26 kg/m2  SpO2 97%  Physical Exam  Constitutional: She appears well-developed and well-nourished. No distress.  HENT:  Head: Normocephalic and atraumatic.  Eyes: Pupils are equal, round, and reactive to light.  Neck: Neck supple.  Cardiovascular: Normal rate.   Pulmonary/Chest: Effort normal. No respiratory distress.  Musculoskeletal:       Left ankle: She exhibits decreased range of motion and swelling. She exhibits no ecchymosis. Tenderness. Lateral malleolus and AITFL tenderness found. No head of 5th metatarsal tenderness found.  Neurological: She is alert. Gait abnormal.  Skin: Skin is warm and dry. She is not diaphoretic.  Psychiatric: She has a normal mood and affect. Her speech is normal and behavior is normal.  Thought content is paranoid and delusional. Cognition and memory are impaired. She expresses impulsivity and inappropriate judgment. She expresses no homicidal and no suicidal ideation. She expresses no suicidal plans and no homicidal plans. She exhibits abnormal recent memory and abnormal remote memory.  Nursing note and vitals reviewed.     3 mos prior a1c 9.2 Assessment & Plan:   [place] drug database checked, and pt never filled her dilaudid prescription. She has only been prescribed 40 tablets of tramadol roughly one month ago in the past year. Prior to that she was getting very small amounts regularly from dr in [facility] which pt does not remember.  1. Acute right ankle pain   2. Right ankle pain -  Sprain, can't wear CAM boot when out or driving but helps pain sig when she is at home.  Tried gel splint today but was uncomfortable but did have sxs improvement with swede-O brace. Cont rice.  Refilled tramadol at absolute max amount which pt understands and agrees to comply w/.  Ok to use 1/2 tab dilauded at night if needed.  3.       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  Spent >20 minutes reviewing the complications from DM if she continues to not take meds. Pt agrees to start glipizide and recheck in 1 mo.  No nsaids until pt lets [place] due bmp - last GFR >18 mos prior was 65. Urine microalb nml 3 mos prior. 4.       Medication non-compliance - Suspect pt is developing dementia due to her disorganization which is complicating her ability to comply with treatment. She wants to try alternative med trx but understands that she will likely feel better if she started med to control her hyperglycemia.  Meds ordered this encounter  Medications  . DISCONTD: traMADol-acetaminophen (ULTRACET) 37.5-325 MG tablet    Sig: Take 1-2 tablets by mouth every 8 (eight) hours as needed for severe pain. DO NOT USE MORE THAN 6 TABS/DAY    Dispense:   60 tablet    Refill:  1  . traMADol (ULTRAM) 50 MG tablet    Sig: Take 1-2 tablets (50-100 mg total) by mouth every 6 (six) hours as needed.    Dispense:  60 tablet    Refill:  2  . gabapentin (NEURONTIN) 300 MG capsule    Sig: Take 1 capsule (300 mg total) by mouth at bedtime.    Dispense:  30 capsule    Refill:  3    I personally performed the services described in this documentation, which was scribed in my presence. The recorded information has been reviewed and considered, and addended by me as needed.  Delman Cheadle, MD MPH  Over 40 min spent in face-to-face evaluation of and consultation with patient and coordination of care.  Over 50% of this time was spent counseling this patient.   By signing my name below, I, [name], attest that this documentation has been prepared under the direction and in the presence of Delman Cheadle, MD. Electronically Signed: [name], ER Scribe. 06/18/2015. 4:12 PM.

## 2015-06-19 DIAGNOSIS — Z9114 Patient's other noncompliance with medication regimen: Secondary | ICD-10-CM | POA: Insufficient documentation

## 2015-06-19 DIAGNOSIS — N182 Chronic kidney disease, stage 2 (mild): Secondary | ICD-10-CM

## 2015-06-19 DIAGNOSIS — E1122 Type 2 diabetes mellitus with diabetic chronic kidney disease: Secondary | ICD-10-CM | POA: Insufficient documentation

## 2015-07-08 ENCOUNTER — Telehealth: Payer: Self-pay

## 2015-07-08 ENCOUNTER — Ambulatory Visit (INDEPENDENT_AMBULATORY_CARE_PROVIDER_SITE_OTHER): Payer: Commercial Managed Care - HMO | Admitting: Family Medicine

## 2015-07-08 VITALS — BP 160/68 | HR 99 | Temp 98.0°F | Resp 18 | Ht 64.5 in | Wt 180.0 lb

## 2015-07-08 DIAGNOSIS — R413 Other amnesia: Secondary | ICD-10-CM | POA: Diagnosis not present

## 2015-07-08 DIAGNOSIS — M792 Neuralgia and neuritis, unspecified: Secondary | ICD-10-CM

## 2015-07-08 DIAGNOSIS — N182 Chronic kidney disease, stage 2 (mild): Secondary | ICD-10-CM

## 2015-07-08 DIAGNOSIS — R350 Frequency of micturition: Secondary | ICD-10-CM | POA: Diagnosis not present

## 2015-07-08 DIAGNOSIS — E1122 Type 2 diabetes mellitus with diabetic chronic kidney disease: Secondary | ICD-10-CM | POA: Diagnosis not present

## 2015-07-08 DIAGNOSIS — Z9114 Patient's other noncompliance with medication regimen: Secondary | ICD-10-CM

## 2015-07-08 DIAGNOSIS — S93401D Sprain of unspecified ligament of right ankle, subsequent encounter: Secondary | ICD-10-CM

## 2015-07-08 DIAGNOSIS — E1149 Type 2 diabetes mellitus with other diabetic neurological complication: Secondary | ICD-10-CM | POA: Diagnosis not present

## 2015-07-08 LAB — POCT URINALYSIS DIP (MANUAL ENTRY)
Bilirubin, UA: NEGATIVE
Blood, UA: NEGATIVE
Glucose, UA: NEGATIVE
NITRITE UA: NEGATIVE
Protein Ur, POC: NEGATIVE
SPEC GRAV UA: 1.025
UROBILINOGEN UA: 2
pH, UA: 6

## 2015-07-08 LAB — POC MICROSCOPIC URINALYSIS (UMFC): MUCUS RE: ABSENT

## 2015-07-08 MED ORDER — HYDROMORPHONE HCL 2 MG PO TABS: 2.0000 mg | ORAL_TABLET | Freq: Two times a day (BID) | ORAL | Status: DC | PRN

## 2015-07-08 MED ORDER — GABAPENTIN 300 MG PO CAPS: 300.0000 mg | ORAL_CAPSULE | Freq: Three times a day (TID) | ORAL | Status: DC

## 2015-07-08 NOTE — Telephone Encounter (Signed)
Needs to be seen for this

## 2015-07-08 NOTE — Telephone Encounter (Signed)
Pt.notified

## 2015-07-08 NOTE — Telephone Encounter (Signed)
Pt needs a RF of Dilaudid and Gabapentin. She has been moving and is in a lot of pain. Please advise. Thanks

## 2015-07-08 NOTE — Patient Instructions (Signed)

## 2015-07-08 NOTE — Progress Notes (Signed)
Subjective:    Patient ID: Donna Nguyen, female    DOB: [birth year], 74 y.o.   MRN: [id number] This chart was scribed for Delman Cheadle, MD by Zola Button, Medical Scribe. This patient was seen in Room 6 and the patient's care was started at 5:41 PM.   Chief Complaint  Patient presents with  . Eye Injury    Cane fell and hit right eye 10 days ago    HPI HPI Comments: Donna Nguyen is a 74 y.o. female with a history of type II DM who presents to the [facility] complaining of sudden onset right eye pain that started after a cane fell and hit her eye 10 days ago. Patient notes the cane fell after she slammed her door. She states that a knot developed to the area lateral to her right eye as a result.  Patient falls frequently, which she attributes to exhaustion, lack of sleep, and turning her ankles. She was initially evaluated here by Dr. Lorelei Pont for a right ankle injury 2 months ago. She states her ankles have been improving. She states she has been taking the Dilaudid once every 4 hours. She reports allergies to acetaminophen. Patient notes that she had dropped some of her gabapentin so is out early but ins won't let her fill for another 10d.  Patient reports having numbness, tingling, and burning to her hands. She does not have hand braces at home. She also reports having burning, tingling, stinging, and aching throughout her whole body. She believes these are due to the cold; she has been living in her [place] without heat, but states she will be able to live in her trailer next week at a new place and will have heat and water.  Past Medical History  Diagnosis Date  . Diabetes mellitus without complication ([place])   . Hypertension   . MI (mitral incompetence)   . Stroke [facility])    Current Outpatient Prescriptions on File Prior to Visit  Medication Sig Dispense Refill  . NYSTATIN, TOPICAL, POWD Apply topically.    . traMADol (ULTRAM) 50 MG tablet Take 1-2 tablets (50-100 mg  total) by mouth every 6 (six) hours as needed. 60 tablet 2  . glipiZIDE (GLUCOTROL) 5 MG tablet Take 1 tablet (5 mg total) by mouth 2 (two) times daily before a meal. (Patient not taking: Reported on 06/15/2015) 60 tablet 3  . hydrochlorothiazide (HYDRODIURIL) 25 MG tablet Take 1 tablet (25 mg total) by mouth daily. (Patient not taking: Reported on 07/08/2015) 30 tablet 3  . neomycin-polymyxin-pramoxine (NEOSPORIN PLUS) 1 % cream Place 1 application vaginally daily as needed (UTI).    . Omeprazole (PRILOSEC PO) Take by mouth.     No current facility-administered medications on file prior to visit.   Allergies  Allergen Reactions  . Acetaminophen Shortness Of Breath  . Amitriptyline   . Ciprofloxacin   . Epinephrine   . Penicillins      Review of Systems  Constitutional: Positive for fatigue. Negative for fever, chills, activity change, appetite change and unexpected weight change.  Eyes: Positive for pain. Negative for visual disturbance.  Cardiovascular: Positive for leg swelling.  Musculoskeletal: Positive for myalgias, back pain, joint swelling, arthralgias and gait problem.  Skin: Positive for wound.  Neurological: Positive for dizziness, weakness, light-headedness, numbness and headaches. Negative for syncope and facial asymmetry.  Psychiatric/Behavioral: Positive for behavioral problems, confusion, sleep disturbance, dysphoric mood and agitation. The patient is nervous/anxious.  Objective:  BP 160/68 mmHg  Pulse 99  Temp(Src) 98 F (36.7 C) (Oral)  Resp 18  Ht 5' 4.5" (1.638 m)  Wt 180 lb (81.647 kg)  BMI 30.43 kg/m2  SpO2 98%  Physical Exam  Constitutional: She is oriented to person, place, and time. She appears well-developed and well-nourished. No distress.  HENT:  Head: Normocephalic. Head is with contusion (right temple). Head is without raccoon's eyes, without Battle's sign, without laceration, without right periorbital erythema and without left periorbital  erythema.  Mouth/Throat: Oropharynx is clear and moist. No oropharyngeal exudate.  Eyes: Pupils are equal, round, and reactive to light.  Neck: Neck supple.  Cardiovascular: Normal rate.   Pulmonary/Chest: Effort normal.  Musculoskeletal: She exhibits no edema.  Neurological: She is alert and oriented to person, place, and time. No cranial nerve deficit.  Skin: Skin is warm and dry. No rash noted.  Psychiatric: She has a normal mood and affect. Her behavior is normal.  Nursing note and vitals reviewed.  Results for orders placed or performed in visit on 07/08/15  POCT urinalysis dipstick  Result Value Ref Range   Color, UA yellow yellow   Clarity, UA cloudy (A) clear   Glucose, UA negative negative   Bilirubin, UA negative negative   Ketones, POC UA trace (5) (A) negative   Spec Grav, UA 1.025    Blood, UA negative negative   pH, UA 6.0    Protein Ur, POC negative negative   Urobilinogen, UA 2.0    Nitrite, UA Negative Negative   Leukocytes, UA Trace (A) Negative  POCT Microscopic Urinalysis ([facility])  Result Value Ref Range   WBC,UR,HPF,POC Few (A) None WBC/hpf   RBC,UR,HPF,POC None None RBC/hpf   Bacteria Few (A) None, Too numerous to count   Mucus Absent Absent   Epithelial Cells, UR Per Microscopy Few (A) None, Too numerous to count cells/hpf        Assessment & Plan:   1. Right ankle sprain, subsequent encounter - slowly improving, cont rice and swede-o brace  2. Type 2 diabetes mellitus with neurological manifestations ([place])   3. Type 2 diabetes mellitus with stage 2 chronic kidney disease, without long-term current use of insulin ([place])   4. Neuropathic pain   5. Noncompliance with medications   6. Memory loss   7. Urinary frequency    Controlled substance database reviewed: She refilled Dilaudid 2 mg #30 3 weeks ago, so is only using 1-2 a day. She refilled tramadol 50 mg 3 weeks ago and refilled it 4 days ago, so is using tramadol QID and still has another refill  on tramadol.  Pt very non-compliant with med regimen due to mental illness with hoarding (so constantly losing her meds) and needle phobia so refuses labs or to check cbgs.  Lower ext pain and freq falling likely due to DM neuropathy so pt made aware that I am unwilling to cont rxing her narcotic pain medication that she is using it to mask the worsening neuropathy and thereby helping to ignore the need for tighter diabetic control to prevent worsening sxs.  If she cont to need pain medication, I will refer her to a pain clinic - pt declines referral for now as she thinks that after she moves next wk she will be able wean down on narcotics.  Pt has been compliant with the sig of her pain medicines since I saw her last mo and reviewed restrictions.  Orders Placed This Encounter  Procedures  .  POCT urinalysis dipstick  . POCT Microscopic Urinalysis ([facility])    Meds ordered this encounter  Medications  . gabapentin (NEURONTIN) 300 MG capsule    Sig: Take 1 capsule (300 mg total) by mouth 3 (three) times daily.    Dispense:  90 capsule    Refill:  1  . HYDROmorphone (DILAUDID) 2 MG tablet    Sig: Take 1 tablet (2 mg total) by mouth every 12 (twelve) hours as needed for severe pain. This is a 1 month supply    Dispense:  60 tablet    Refill:  0    I personally performed the services described in this documentation, which was scribed in my presence. The recorded information has been reviewed and considered, and addended by me as needed.  Delman Cheadle, MD MPH   By signing my name below, I, Zola Button, attest that this documentation has been prepared under the direction and in the presence of Delman Cheadle, MD.  Electronically Signed: Zola Button, Medical Scribe. 07/08/2015. 5:41 PM.

## 2015-08-10 ENCOUNTER — Other Ambulatory Visit: Payer: Self-pay | Admitting: Family Medicine

## 2015-08-15 ENCOUNTER — Other Ambulatory Visit: Payer: Self-pay | Admitting: Family Medicine

## 2015-09-09 ENCOUNTER — Encounter: Payer: Self-pay | Admitting: Family Medicine

## 2015-09-14 ENCOUNTER — Encounter: Payer: Self-pay | Admitting: Family Medicine

## 2015-10-27 ENCOUNTER — Other Ambulatory Visit: Payer: Self-pay | Admitting: Family Medicine

## 2015-10-27 ENCOUNTER — Telehealth: Payer: Self-pay

## 2015-10-27 MED ORDER — GABAPENTIN 300 MG PO CAPS: 300.0000 mg | ORAL_CAPSULE | Freq: Three times a day (TID) | ORAL | Status: DC

## 2015-10-27 NOTE — Telephone Encounter (Signed)
Refill sent in - will need OV before any additional refills.

## 2015-10-27 NOTE — Telephone Encounter (Signed)
Dr. Brigitte Pulse, [facility] on [address] wants to know if she can have a refill on gabapentin (NEURONTIN) 300 MG capsule [id number]? Patient is out completely.   Call back number is [phone].

## 2015-10-27 NOTE — Telephone Encounter (Signed)
Pt is needing a refill on her medication that has been requested she is completely out and is going to come in tomorrow but is having transportation issues and is at pharmacy now and is hoping to get an emergency refill   Best number [phone]

## 2015-10-27 NOTE — Telephone Encounter (Signed)
Spoke with pt, she already received medication.

## 2015-10-27 NOTE — Addendum Note (Signed)
Addended by: Delman Cheadle on: 10/27/2015 05:28 PM   Modules accepted: Orders

## 2015-10-28 ENCOUNTER — Ambulatory Visit (INDEPENDENT_AMBULATORY_CARE_PROVIDER_SITE_OTHER): Payer: Commercial Managed Care - HMO

## 2015-10-28 ENCOUNTER — Ambulatory Visit (INDEPENDENT_AMBULATORY_CARE_PROVIDER_SITE_OTHER): Payer: Commercial Managed Care - HMO | Admitting: Family Medicine

## 2015-10-28 VITALS — BP 116/70 | HR 95 | Temp 98.0°F | Resp 18 | Wt 175.6 lb

## 2015-10-28 DIAGNOSIS — R350 Frequency of micturition: Secondary | ICD-10-CM

## 2015-10-28 DIAGNOSIS — E1142 Type 2 diabetes mellitus with diabetic polyneuropathy: Secondary | ICD-10-CM

## 2015-10-28 DIAGNOSIS — M79601 Pain in right arm: Secondary | ICD-10-CM

## 2015-10-28 DIAGNOSIS — I509 Heart failure, unspecified: Secondary | ICD-10-CM | POA: Diagnosis not present

## 2015-10-28 DIAGNOSIS — R079 Chest pain, unspecified: Secondary | ICD-10-CM

## 2015-10-28 DIAGNOSIS — M79609 Pain in unspecified limb: Secondary | ICD-10-CM | POA: Diagnosis not present

## 2015-10-28 LAB — COMPREHENSIVE METABOLIC PANEL
ALBUMIN: 4 g/dL (ref 3.6–5.1)
ALK PHOS: 91 U/L (ref 33–130)
ALT: 14 U/L (ref 6–29)
AST: 13 U/L (ref 10–35)
BILIRUBIN TOTAL: 0.4 mg/dL (ref 0.2–1.2)
BUN: 22 mg/dL (ref 7–25)
CALCIUM: 9.6 mg/dL (ref 8.6–10.4)
CO2: 25 mmol/L (ref 20–31)
Chloride: 102 mmol/L (ref 98–110)
Creat: 1.2 mg/dL — ABNORMAL HIGH (ref 0.60–0.93)
Glucose, Bld: 105 mg/dL — ABNORMAL HIGH (ref 65–99)
Potassium: 4.5 mmol/L (ref 3.5–5.3)
Sodium: 138 mmol/L (ref 135–146)
Total Protein: 6.9 g/dL (ref 6.1–8.1)

## 2015-10-28 LAB — POCT URINALYSIS DIP (MANUAL ENTRY)
Bilirubin, UA: NEGATIVE
GLUCOSE UA: NEGATIVE
Ketones, POC UA: NEGATIVE
Nitrite, UA: NEGATIVE
Protein Ur, POC: NEGATIVE
RBC UA: NEGATIVE
SPEC GRAV UA: 1.01
UROBILINOGEN UA: 0.2
pH, UA: 7

## 2015-10-28 LAB — POC MICROSCOPIC URINALYSIS (UMFC): Mucus: ABSENT

## 2015-10-28 LAB — POCT GLYCOSYLATED HEMOGLOBIN (HGB A1C): Hemoglobin A1C: 6.8

## 2015-10-28 LAB — CK: CK TOTAL: 64 U/L (ref 7–177)

## 2015-10-28 MED ORDER — GLIMEPIRIDE 4 MG PO TABS: 4.0000 mg | ORAL_TABLET | Freq: Every day | ORAL | Status: DC

## 2015-10-28 MED ORDER — GABAPENTIN 300 MG PO CAPS: 300.0000 mg | ORAL_CAPSULE | Freq: Three times a day (TID) | ORAL | Status: DC

## 2015-10-28 MED ORDER — TRAMADOL HCL 50 MG PO TABS: ORAL_TABLET | ORAL | Status: DC

## 2015-10-28 NOTE — Patient Instructions (Addendum)
IF you received an x-ray today, you will receive an invoice from [facility]. Please contact [facility] at [phone] with questions or concerns regarding your invoice.   IF you received labwork today, you will receive an invoice from [employer]. Please contact [facility] at [phone] with questions or concerns regarding your invoice.   Our billing staff will not be able to assist you with questions regarding bills from these companies.  You will be contacted with the lab results as soon as they are available. The fastest way to get your results is to activate your My Chart account. Instructions are located on the last page of this paperwork. If you have not heard from [place] regarding the results in 2 weeks, please contact this office.  Eating Healthy on a Budget There are many ways to save money at the grocery store and continue to eat healthy. You can be successful if you plan your meals according to your budget, purchase according to your budget and grocery list, and prepare food yourself.  How can I buy more food on a limited budget? Plan  Plan meals and snacks according to a grocery list and budget you create.  Look for recipes where you can cook once and make enough food for two meals.  Include meals that will "stretch" more expensive foods such as stews, casseroles, and stir-fry dishes.  Make a grocery list and make sure to bring it with you to the store. If you have a smart phone, you could use your phone to create your shopping list. Purchase  When grocery shopping, buy only the items on your grocery list and go only to the areas of the store that have the items on your list. Prepare  Some meal items can be prepared in advance. Pre-cook on days when you have extra time.  Make extra food (such as by doubling recipes) and freeze the extras in meal-sized containers or in individual portions for fast meals and snacks.  Use leftovers in  your meal plan for the week.  Try some meatless meals or try "no cook" meals like salads.  When you come home from the grocery store, wash and prepare your fruits and vegetables so they are ready to use and eat. This will help reduce food waste. How can I buy more food on a limited budget?  Try these tips the next time you go shopping:   [place] store brands or generic brands.  Use coupons only for foods and brands you normally buy. Avoid buying items you wouldn't normally buy simply because they are on sale.  Check online and in newspapers for weekly deals.  Buy healthy items from the bulk bins when available, such as herbs, spices, flours, pastas, nuts, and dried fruit.  Buy fruits and vegetables that are in season. Prices are usually lower on in-season produce.  Compare and contrast different items. You can do this by looking at the unit price on the price tag. Use it to compare different brands and sizes to find out which item is the best deal.  Choose naturally low-cost healthy items, such as carrots, potatoes, apples, bananas, and oranges. Dried or canned beans are a low-cost protein source.  Buy in bulk and freeze extra food. Items you can buy in bulk include meats, fish, poultry, frozen fruits, and frozen vegetables.  Limit the purchase of prepared or "ready-to-eat" foods, such as pre-cut fruits and vegetables and pre-made salads.  If possible, shop around to discover which grocery  store offers the best prices. Some stores charge much more than other stores for the same items.  Do not shop when you are hungry. If you shop while hungry, It may be hard to stick to your list and budget.  Stick to your list and resist impulse buys. Treat your list as your official plan for the week.  Buy a variety of vegetables and fruit by purchasing fresh, frozen, and canned items.  Look beyond eye level. Foods at eye level (adult or child eye level) are more expensive. Look at the top and bottom  shelves for deals.  Be efficient with your time when shopping. The more time you spend at the store, the more money you are likely to spend.  Consider other retailers such as dollar stores, larger [employer], local fruit and vegetable stands, and farmers markets. What are some tips for less expensive food substitutions? When choosing more expensive foods like meats and dairy, try these tips to save money:  Choose cheaper cuts of meat, such as bone-in chicken thighs and drumsticks instead skinless and boneless chicken. When you are ready to prepare the chicken, you can remove the skin yourself to make it healthier.  Choose lean meats like chicken or [place]. When choosing ground beef, make sure it is lean ground beef (92% lean, 8% fat). If you do buy a fattier ground beef, drain the fat before eating.  Buy dried beans and peas, such as lentils, split peas, or kidney beans.  For seafood, choose canned tuna, salmon, or sardines.  Eggs are a low-cost source of protein.  Buy the larger tubs of yogurt instead of individual-sized containers.  Choose water instead of sodas and other sweetened beverages.  Skip buying chips, cookies, and other "junk food". These items are usually expensive, high in calories, and low in nutritional value. How can I prepare the foods I buy in the healthiest way? Practice these tips for cooking foods in the healthiest way to reduce excess fat and calorie intake:  Steam, saute, grill, or bake foods instead of frying them.  Make sure half your plate is filled with fruits or vegetables. Choose from fresh, frozen, or canned fruits and vegetables. If eating canned, remember to rinse them before eating. This will remove any excess salt added for packaging.  Trim all fat from meat before cooking. Remove the skin from chicken or [place].  Spoon off fat from meat dishes once they have been chilled in the refrigerator and the fat has hardened on the top.  Use skim  milk, low-fat milk, or evaporated skim milk when making cream sauces, soups, or puddings.  Substitute low-fat yogurt, sour cream, or cottage cheese for sour cream and mayonnaise in dips and dressings.  Try lemon juice, herbs, or spices to season food instead of salt, butter, or margarine.   This information is not intended to replace advice given to you by your health care provider. Make sure you discuss any questions you have with your health care provider.   Document Released: 04/09/2014 Document Reviewed: 04/09/2014 Elsevier Interactive Patient Education [employer].
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
## 2015-10-29 LAB — MICROALBUMIN / CREATININE URINE RATIO
CREATININE, URINE: 43 mg/dL (ref 20–320)
Microalb, Ur: <0.2 mg/dL

## 2015-10-29 LAB — BRAIN NATRIURETIC PEPTIDE: Brain Natriuretic Peptide: 10 pg/mL (ref <100)

## 2015-10-29 LAB — TSH: TSH: 2.98 mIU/L

## 2015-10-29 LAB — URINE CULTURE

## 2015-11-01 ENCOUNTER — Encounter: Payer: Self-pay | Admitting: Family Medicine

## 2015-12-16 ENCOUNTER — Ambulatory Visit (INDEPENDENT_AMBULATORY_CARE_PROVIDER_SITE_OTHER): Payer: Commercial Managed Care - HMO | Admitting: Family Medicine

## 2015-12-16 ENCOUNTER — Telehealth: Payer: Self-pay | Admitting: Family Medicine

## 2015-12-16 VITALS — BP 122/70 | HR 96 | Temp 97.7°F | Resp 16 | Ht 64.5 in | Wt 180.8 lb

## 2015-12-16 DIAGNOSIS — W57XXXA Bitten or stung by nonvenomous insect and other nonvenomous arthropods, initial encounter: Secondary | ICD-10-CM

## 2015-12-16 DIAGNOSIS — E1142 Type 2 diabetes mellitus with diabetic polyneuropathy: Secondary | ICD-10-CM

## 2015-12-16 DIAGNOSIS — T148 Other injury of unspecified body region: Secondary | ICD-10-CM | POA: Diagnosis not present

## 2015-12-16 MED ORDER — GABAPENTIN 300 MG PO CAPS: 300.0000 mg | ORAL_CAPSULE | Freq: Three times a day (TID) | ORAL | Status: DC

## 2015-12-16 MED ORDER — TRAMADOL HCL 50 MG PO TABS: ORAL_TABLET | ORAL | Status: DC

## 2015-12-16 MED ORDER — SITAGLIPTIN PHOSPHATE 100 MG PO TABS
100.0000 mg | ORAL_TABLET | Freq: Every day | ORAL | Status: DC
Start: 2015-12-16 — End: 2016-05-14

## 2015-12-16 MED ORDER — DOXYCYCLINE HYCLATE 100 MG PO TABS: 100.0000 mg | ORAL_TABLET | Freq: Two times a day (BID) | ORAL | Status: DC

## 2015-12-16 NOTE — Telephone Encounter (Signed)
Answering service call at 1:30 a.m.  C/o her missing meds - thinks dogs buried it. 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 Hands and chest hurting from withdrawal and pain is so severe she cannot stand it. Pt informed that if her pain is so severe that she cannot wait 5 hours until the clinic opens at 8 a.m. to have it addressed then she needs to go to the ER. Inappropriate to call in the middle of the night for a refill on pain medication.  If for some reason she needs an early fill on her pain medicine, then she needs to come in for an OV to discuss this w/ a provider.
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
## 2015-12-16 NOTE — Patient Instructions (Addendum)
Take Januvia 1 daily for diabetes  Resume your gabapentin 3 times daily 200 mg  Resume using the tramadol 50 mg 3 times daily. Do not exceed this, and use less if possible. It will not be refilled if you lose the prescription.  Take doxycycline 100 mg one twice daily with food. Take caution to avoid excessive sunlight while on the doxycycline  Return to follow-up with Dr. Brigitte Pulse in about 3 months. Sooner if problems.    IF you received an x-ray today, you will receive an invoice from [facility]. Please contact [facility] at [phone] with questions or concerns regarding your invoice.   IF you received labwork today, you will receive an invoice from [employer]. Please contact [facility] at [phone] with questions or concerns regarding your invoice.   Our billing staff will not be able to assist you with questions regarding bills from these companies.  You will be contacted with the lab results as soon as they are available. The fastest way to get your results is to activate your My Chart account. Instructions are located on the last page of this paperwork. If you have not heard from [place] regarding the results in 2 weeks, please contact this office.

## 2015-12-19 ENCOUNTER — Telehealth: Payer: Self-pay

## 2015-12-19 NOTE — Telephone Encounter (Signed)
Patient found her lost medication in between the car seats.

## 2015-12-22 ENCOUNTER — Telehealth: Payer: Self-pay

## 2015-12-22 NOTE — Telephone Encounter (Signed)
Msg is for [facility], pt needs coupons for [employer] to get it cheaper. She said that if we receive any to please contact her  Please advise  [phone]

## 2015-12-24 ENCOUNTER — Telehealth: Payer: Self-pay

## 2015-12-24 NOTE — Telephone Encounter (Signed)
I returned call to pt.  Advised we have coupon & card for Januvia.  Pt states Dr. Linna Darner gave her one and she is ineligible to receive the discount.  Pt states she cannot afford the price of the med and is there anything else she can do. Message forwarded to Dr. Linna Darner.

## 2015-12-24 NOTE — Telephone Encounter (Signed)
I returned call to Donna Nguyen.  Advised we have coupon & card for Januvia.  Donna Nguyen states Dr. Linna Darner gave her one and she is ineligible to receive the discount.  Donna Nguyen states she cannot afford the price of the med and is there anything else she can do. Message forwarded to Dr. Linna Darner.

## 2015-12-25 NOTE — Telephone Encounter (Signed)
Other possible meds if we can get them would be onglyza, victoza ,farixiga, bydureon, etc.

## 2015-12-25 NOTE — Telephone Encounter (Signed)
Call patient:  Although I failed to put it in my note I seem to recall she cannot take metformin.  Please check with her to be sure that is the case    Options are limited.  Ask Donna Nguyen if any of the diabetic companies have an indigent program she could qualify for for free medicine since she is homeless,

## 2015-12-26 NOTE — Telephone Encounter (Signed)
[place] will cover Onglyza tabs, Farxiga tabs, or Bydureon pens or vials if pt qualifies. Unlike most programs/savings cards, it looks like they will sometimes help pts who have Medicare D plans like this pt. I have printed off a form and will put it in your box, Dr Linna Darner, to fill in Rx info and signature (highlighted on top page). [employer] manufacturer program does not work for pts with Medicare.

## 2015-12-26 NOTE — Telephone Encounter (Signed)
I did find documentation in EPIC under another offices notes on 03/14/15 that pt refused Rx for metformin d/t intolerable SEs. Dr Linna Darner, see message below.

## 2015-12-26 NOTE — Telephone Encounter (Signed)
This is a duplicate message, see notes under 5/4 message.

## 2015-12-27 NOTE — Telephone Encounter (Signed)
[place] is ready for pt to fill in her portion and send to program w/her documentation. Tried to call pt to advise of plan, but her VM was full. Left our call back number when prompted by message.

## 2015-12-31 NOTE — Telephone Encounter (Signed)
Noted things are ready for patient  Donna Nguyen

## 2016-01-16 ENCOUNTER — Other Ambulatory Visit: Payer: Self-pay | Admitting: Family Medicine

## 2016-01-20 NOTE — Telephone Encounter (Signed)
This has been pending in Dr [employer] box for a while. I will send to PA pool to review since I keep getting faxes from pharm and it doesn't appear that anyone has seen the request yet. Thanks!

## 2016-01-21 NOTE — Telephone Encounter (Signed)
Filling prescription.  Please address how she will obtain it with patient

## 2016-01-23 NOTE — Telephone Encounter (Signed)
Faxed

## 2016-02-16 ENCOUNTER — Other Ambulatory Visit: Payer: Self-pay | Admitting: Physician Assistant

## 2016-02-17 NOTE — Telephone Encounter (Signed)
Called pharm and they were able to find the RFs from the 3/10 Rx for #60. The pt had req'd a RF on the one written in Apr for #90, but she will fill one of the 3/10 RFs for #60 and let the pt know when ready.

## 2016-02-17 NOTE — Telephone Encounter (Signed)
Pt was given a rx for a 1 mo supply with 6 refills on 3/10. She then received another rx from Dr. Linna Darner in 1 mo in April so pt does not need refills until 10/10.  She should check with her pharmacy - she should have a rx on file for tramadol

## 2016-04-25 ENCOUNTER — Telehealth: Payer: Self-pay

## 2016-04-25 NOTE — Telephone Encounter (Signed)
Pt would like a refill on her traMADol (ULTRAM) 50 MG tablet [id number]. She has run out early. Pharmacy:  [facility] [address], [place] [address]

## 2016-04-26 NOTE — Telephone Encounter (Signed)
PT CALLED AGAIN REQUESTING A REFILL.

## 2016-04-27 ENCOUNTER — Other Ambulatory Visit: Payer: Self-pay

## 2016-04-27 NOTE — Telephone Encounter (Signed)
Evidently this is req for early fill also. See notes under pt ph message from 9/6.

## 2016-04-27 NOTE — Telephone Encounter (Signed)
Pharm reqs RF of tramadol. 

## 2016-04-27 NOTE — Telephone Encounter (Signed)
Needs to be seen for refill.  Pt looses her meds all the time, sometimes finds them later. Is a horrible historian and not very compliant.  However, I am unwilling to write for this without seeing pt in office since she is not a reliable historian

## 2016-04-27 NOTE — Telephone Encounter (Signed)
Tried to call pt but could not LM, VM full. Why did pt run out early? It looks like pt was given new Rx from Dr Linna Darner in April w/directions to take Q8hrs #90 - 30 day supply. In the past Rx was written by Dr Brigitte Pulse for Q6 hrs, but only #60 for a 30 day supply. Pt may have been taking the Rx as written by Dr Linna Darner, but only had #60 and ran out early? Also a Rx request sent to Dr Brigitte Pulse.

## 2016-04-28 NOTE — Telephone Encounter (Signed)
Called patient to advise. Her mailbox is full

## 2016-04-30 NOTE — Telephone Encounter (Signed)
VM still full. Left our number as call back number when prompted by VM. Please give her Dr Raul Del instr's below if she calls back.

## 2016-04-30 NOTE — Telephone Encounter (Signed)
No - pt needs to be seen for refills. I have not seen her in a long time.

## 2016-05-04 ENCOUNTER — Other Ambulatory Visit: Payer: Self-pay | Admitting: Family Medicine

## 2016-05-04 ENCOUNTER — Encounter: Payer: Self-pay | Admitting: Pharmacist

## 2016-05-04 DIAGNOSIS — E1142 Type 2 diabetes mellitus with diabetic polyneuropathy: Secondary | ICD-10-CM

## 2016-05-04 NOTE — Progress Notes (Unsigned)
Patient seen today at health event at [facility] by Marliss Coots, NP.   BP recent BG WNL. Education provided on heart-healthy diet/exercise and diabetic neuropathy (patient reports neuropathic pain symptoms). Patient stated she needs help with medication refills, contacted Kristopher Oppenheim to process refill requests (gabapentin, tramadol). Reports no other signs/symptoms of concern today. Patient advised to schedule follow up care including cardiology. Patient verbalized understanding.

## 2016-05-14 ENCOUNTER — Ambulatory Visit: Payer: Commercial Managed Care - HMO | Attending: Internal Medicine | Admitting: Physician Assistant

## 2016-05-14 ENCOUNTER — Encounter: Payer: Self-pay | Admitting: Licensed Clinical Social Worker

## 2016-05-14 VITALS — BP 142/66 | HR 101 | Temp 97.6°F | Ht 64.5 in | Wt 184.4 lb

## 2016-05-14 DIAGNOSIS — E1122 Type 2 diabetes mellitus with diabetic chronic kidney disease: Secondary | ICD-10-CM | POA: Diagnosis not present

## 2016-05-14 DIAGNOSIS — Z9114 Patient's other noncompliance with medication regimen: Secondary | ICD-10-CM | POA: Insufficient documentation

## 2016-05-14 DIAGNOSIS — N182 Chronic kidney disease, stage 2 (mild): Secondary | ICD-10-CM

## 2016-05-14 DIAGNOSIS — E1140 Type 2 diabetes mellitus with diabetic neuropathy, unspecified: Secondary | ICD-10-CM | POA: Insufficient documentation

## 2016-05-14 DIAGNOSIS — R300 Dysuria: Secondary | ICD-10-CM | POA: Diagnosis not present

## 2016-05-14 DIAGNOSIS — Z9119 Patient's noncompliance with other medical treatment and regimen: Secondary | ICD-10-CM | POA: Insufficient documentation

## 2016-05-14 DIAGNOSIS — E1142 Type 2 diabetes mellitus with diabetic polyneuropathy: Secondary | ICD-10-CM

## 2016-05-14 DIAGNOSIS — Z79899 Other long term (current) drug therapy: Secondary | ICD-10-CM | POA: Insufficient documentation

## 2016-05-14 LAB — GLUCOSE, POCT (MANUAL RESULT ENTRY): POC Glucose: 212 mg/dl — AB (ref 70–99)

## 2016-05-14 LAB — POCT URINALYSIS DIPSTICK
BILIRUBIN UA: NEGATIVE
GLUCOSE UA: NEGATIVE
KETONES UA: NEGATIVE
LEUKOCYTES UA: NEGATIVE
Nitrite, UA: NEGATIVE
Protein, UA: NEGATIVE
Spec Grav, UA: 1.02
Urobilinogen, UA: 0.2
pH, UA: 5.5

## 2016-05-14 MED ORDER — GABAPENTIN 300 MG PO CAPS: 300.0000 mg | ORAL_CAPSULE | Freq: Three times a day (TID) | ORAL | 0 refills | Status: DC

## 2016-05-14 MED ORDER — SULFAMETHOXAZOLE-TRIMETHOPRIM 400-80 MG PO TABS: 1.0000 | ORAL_TABLET | Freq: Two times a day (BID) | ORAL | 0 refills | Status: AC

## 2016-05-14 MED ORDER — TRAMADOL HCL 50 MG PO TABS: 50.0000 mg | ORAL_TABLET | Freq: Three times a day (TID) | ORAL | 0 refills | Status: DC | PRN

## 2016-05-14 MED ORDER — SITAGLIPTIN PHOSPHATE 100 MG PO TABS: 100.0000 mg | ORAL_TABLET | Freq: Every day | ORAL | 3 refills | Status: DC

## 2016-05-14 MED FILL — traMADol HCL 50 MG TABS: 50 | 15 days supply | Qty: 45 | Fill #0

## 2016-05-14 MED FILL — SULFAMETHOXAZOLE-TMP SS TAB: 400-80 | 3 days supply | Qty: 6 | Fill #0
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
## 2016-06-06 ENCOUNTER — Telehealth: Payer: Self-pay | Admitting: Internal Medicine

## 2016-06-06 MED FILL — GABAPENTIN 300 MG CAPSULE: 300 | 30 days supply | Qty: 90 | Fill #0

## 2016-06-06 NOTE — Telephone Encounter (Signed)
Pt. Came into facility to drop of a letter for PCP. Pt. Used to see Mateo Flow and states that he has a reaction on Tramadol. The letter will be put in Dr. Doreene Burke box. Please f/u

## 2016-06-08 NOTE — Telephone Encounter (Signed)
She could show the bottles to [place] who is an expert in this. Thanks

## 2016-06-08 NOTE — Telephone Encounter (Signed)
Patient states she had a four day reaction to the tramadol recently prescribed which consisted of full out sweats and feeling like she was going to pass out. Patient claims to have been jittery for four days. Patient found a bottle of tramadol which was prescribed from valerie some time ago and patient states she had no reaction. Patient would like to present both bottles to you and request you change them to the previous script to avoid a reaction. Please advise.

## 2016-06-11 NOTE — Telephone Encounter (Signed)
Donna Nguyen never prescribed tramadol from what I can tell. She has always received it from the ED or urgent care. And the directions for the tramadol have always been the same except from every 6 hours vs every 8 hours (she currently has every 8 hours) and this difference would not cause what she is talking about. She did have a script from 05/2015 where she could take up to 100 mg. However, this also would not cause her report symptoms. Also checked the [place] Controlled Substance Registry and didn't see any additional tramadol scripts. I am more than happy to meet with her to evaluate the prescription bottles.

## 2016-06-12 ENCOUNTER — Ambulatory Visit: Payer: Commercial Managed Care - HMO | Attending: Internal Medicine | Admitting: Pharmacist

## 2016-06-12 DIAGNOSIS — X58XXXA Exposure to other specified factors, initial encounter: Secondary | ICD-10-CM | POA: Insufficient documentation

## 2016-06-12 DIAGNOSIS — R6883 Chills (without fever): Secondary | ICD-10-CM | POA: Diagnosis not present

## 2016-06-12 DIAGNOSIS — T788XXA Other adverse effects, not elsewhere classified, initial encounter: Secondary | ICD-10-CM | POA: Insufficient documentation

## 2016-06-12 DIAGNOSIS — Z79899 Other long term (current) drug therapy: Secondary | ICD-10-CM | POA: Insufficient documentation

## 2016-06-12 DIAGNOSIS — R42 Dizziness and giddiness: Secondary | ICD-10-CM | POA: Diagnosis not present
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
## 2016-06-20 ENCOUNTER — Encounter (INDEPENDENT_AMBULATORY_CARE_PROVIDER_SITE_OTHER): Payer: Self-pay

## 2016-06-20 ENCOUNTER — Encounter: Payer: Self-pay | Admitting: Licensed Clinical Social Worker

## 2016-06-20 ENCOUNTER — Ambulatory Visit: Payer: Commercial Managed Care - HMO | Attending: Internal Medicine | Admitting: Internal Medicine

## 2016-06-20 ENCOUNTER — Encounter: Payer: Self-pay | Admitting: Internal Medicine

## 2016-06-20 VITALS — BP 128/83 | HR 84 | Temp 98.1°F | Resp 16 | Wt 187.4 lb

## 2016-06-20 DIAGNOSIS — M79604 Pain in right leg: Secondary | ICD-10-CM | POA: Insufficient documentation

## 2016-06-20 DIAGNOSIS — Z886 Allergy status to analgesic agent status: Secondary | ICD-10-CM | POA: Insufficient documentation

## 2016-06-20 DIAGNOSIS — Z5900 Homelessness unspecified: Secondary | ICD-10-CM

## 2016-06-20 DIAGNOSIS — R350 Frequency of micturition: Secondary | ICD-10-CM | POA: Insufficient documentation

## 2016-06-20 DIAGNOSIS — Z888 Allergy status to other drugs, medicaments and biological substances status: Secondary | ICD-10-CM | POA: Insufficient documentation

## 2016-06-20 DIAGNOSIS — I10 Essential (primary) hypertension: Secondary | ICD-10-CM | POA: Insufficient documentation

## 2016-06-20 DIAGNOSIS — Z8673 Personal history of transient ischemic attack (TIA), and cerebral infarction without residual deficits: Secondary | ICD-10-CM | POA: Insufficient documentation

## 2016-06-20 DIAGNOSIS — R351 Nocturia: Secondary | ICD-10-CM | POA: Diagnosis not present

## 2016-06-20 DIAGNOSIS — Z881 Allergy status to other antibiotic agents status: Secondary | ICD-10-CM | POA: Insufficient documentation

## 2016-06-20 DIAGNOSIS — M79605 Pain in left leg: Secondary | ICD-10-CM | POA: Diagnosis not present

## 2016-06-20 DIAGNOSIS — Z880 Allergy status to penicillin: Secondary | ICD-10-CM | POA: Insufficient documentation

## 2016-06-20 DIAGNOSIS — E1140 Type 2 diabetes mellitus with diabetic neuropathy, unspecified: Secondary | ICD-10-CM | POA: Insufficient documentation

## 2016-06-20 DIAGNOSIS — I252 Old myocardial infarction: Secondary | ICD-10-CM | POA: Insufficient documentation

## 2016-06-20 DIAGNOSIS — F411 Generalized anxiety disorder: Secondary | ICD-10-CM | POA: Insufficient documentation

## 2016-06-20 DIAGNOSIS — E1142 Type 2 diabetes mellitus with diabetic polyneuropathy: Secondary | ICD-10-CM

## 2016-06-20 DIAGNOSIS — G894 Chronic pain syndrome: Secondary | ICD-10-CM | POA: Insufficient documentation

## 2016-06-20 DIAGNOSIS — N393 Stress incontinence (female) (male): Secondary | ICD-10-CM

## 2016-06-20 LAB — CMP AND LIVER
ALT: 17 U/L (ref 6–29)
AST: 15 U/L (ref 10–35)
Albumin: 4.3 g/dL (ref 3.6–5.1)
Alkaline Phosphatase: 83 U/L (ref 33–130)
BILIRUBIN DIRECT: 0.1 mg/dL (ref <=0.2)
BILIRUBIN INDIRECT: 0.4 mg/dL (ref 0.2–1.2)
BUN: 19 mg/dL (ref 7–25)
CALCIUM: 9.7 mg/dL (ref 8.6–10.4)
CHLORIDE: 103 mmol/L (ref 98–110)
CO2: 21 mmol/L (ref 20–31)
CREATININE: 1.4 mg/dL — AB (ref 0.60–0.93)
Glucose, Bld: 118 mg/dL — ABNORMAL HIGH (ref 65–99)
POTASSIUM: 4.2 mmol/L (ref 3.5–5.3)
Sodium: 139 mmol/L (ref 135–146)
Total Bilirubin: 0.5 mg/dL (ref 0.2–1.2)
Total Protein: 6.9 g/dL (ref 6.1–8.1)

## 2016-06-20 LAB — POCT URINALYSIS DIPSTICK
BILIRUBIN UA: NEGATIVE
Glucose, UA: NEGATIVE
KETONES UA: NEGATIVE
LEUKOCYTES UA: NEGATIVE
Nitrite, UA: NEGATIVE
PH UA: 6.5
PROTEIN UA: NEGATIVE
RBC UA: NEGATIVE
SPEC GRAV UA: 1.015
Urobilinogen, UA: 0.2

## 2016-06-20 LAB — CBC WITH DIFFERENTIAL/PLATELET
BASOS ABS: 0 {cells}/uL (ref 0–200)
Basophils Relative: 0 %
EOS PCT: 1 %
Eosinophils Absolute: 96 cells/uL (ref 15–500)
HCT: 42.5 % (ref 35.0–45.0)
Hemoglobin: 14.4 g/dL (ref 11.7–15.5)
Lymphocytes Relative: 33 %
Lymphs Abs: 3168 cells/uL (ref 850–3900)
MCH: 29.8 pg (ref 27.0–33.0)
MCHC: 33.9 g/dL (ref 32.0–36.0)
MCV: 88 fL (ref 80.0–100.0)
MONOS PCT: 7 %
MPV: 10 fL (ref 7.5–12.5)
Monocytes Absolute: 672 cells/uL (ref 200–950)
NEUTROS ABS: 5664 {cells}/uL (ref 1500–7800)
Neutrophils Relative %: 59 %
PLATELETS: 319 10*3/uL (ref 140–400)
RBC: 4.83 MIL/uL (ref 3.80–5.10)
RDW: 14.1 % (ref 11.0–15.0)
WBC: 9.6 10*3/uL (ref 3.8–10.8)

## 2016-06-20 LAB — POCT GLYCOSYLATED HEMOGLOBIN (HGB A1C): HEMOGLOBIN A1C: 7.1

## 2016-06-20 LAB — GLUCOSE, POCT (MANUAL RESULT ENTRY): POC GLUCOSE: 157 mg/dL — AB (ref 70–99)

## 2016-06-20 MED ORDER — METFORMIN HCL ER 500 MG PO TB24: 500.0000 mg | ORAL_TABLET | Freq: Every day | ORAL | 3 refills | Status: DC

## 2016-06-20 MED ORDER — TRAMADOL HCL 50 MG PO TABS: 50.0000 mg | ORAL_TABLET | Freq: Three times a day (TID) | ORAL | 0 refills | Status: DC | PRN

## 2016-06-20 MED ORDER — GABAPENTIN 300 MG PO CAPS: 300.0000 mg | ORAL_CAPSULE | Freq: Three times a day (TID) | ORAL | 2 refills | Status: DC

## 2016-06-20 MED ORDER — OXYBUTYNIN CHLORIDE ER 5 MG PO TB24: 5.0000 mg | ORAL_TABLET | Freq: Every day | ORAL | 3 refills | Status: DC
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
## 2016-06-20 NOTE — Progress Notes (Signed)
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

## 2016-06-20 NOTE — Patient Instructions (Signed)
Menopause is a normal process in which your reproductive ability comes to an end. This process happens gradually over a span of months to years, usually between the ages of 48 and 55. Menopause is complete when you have missed 12 consecutive menstrual periods. It is important to talk with your health care provider about some of the most common conditions that affect postmenopausal women, such as heart disease, cancer, and bone loss (osteoporosis). Adopting a healthy lifestyle and getting preventive care can help to promote your health and wellness. Those actions can also lower your chances of developing some of these common conditions. WHAT SHOULD I KNOW ABOUT MENOPAUSE? During menopause, you may experience a number of symptoms, such as:  Moderate-to-severe hot flashes.  Night sweats.  Decrease in sex drive.  Mood swings.  Headaches.  Tiredness.  Irritability.  Memory problems.  Insomnia. Choosing to treat or not to treat menopausal changes is an individual decision that you make with your health care provider. WHAT SHOULD I KNOW ABOUT HORMONE REPLACEMENT THERAPY AND SUPPLEMENTS? Hormone therapy products are effective for treating symptoms that are associated with menopause, such as hot flashes and night sweats. Hormone replacement carries certain risks, especially as you become older. If you are thinking about using estrogen or estrogen with progestin treatments, discuss the benefits and risks with your health care provider. WHAT SHOULD I KNOW ABOUT HEART DISEASE AND STROKE? Heart disease, heart attack, and stroke become more likely as you age. This may be due, in part, to the hormonal changes that your body experiences during menopause. These can affect how your body processes dietary fats, triglycerides, and cholesterol. Heart attack and stroke are both medical emergencies. There are many things that you can do to help prevent heart disease and stroke:  Have your blood pressure  checked at least every 1-2 years. High blood pressure causes heart disease and increases the risk of stroke.  If you are 55-79 years old, ask your health care provider if you should take aspirin to prevent a heart attack or a stroke.  Do not use any tobacco products, including cigarettes, chewing tobacco, or electronic cigarettes. If you need help quitting, ask your health care provider.  It is important to eat a healthy diet and maintain a healthy weight.  Be sure to include plenty of vegetables, fruits, low-fat dairy products, and lean protein.  Avoid eating foods that are high in solid fats, added sugars, or salt (sodium).  Get regular exercise. This is one of the most important things that you can do for your health.  Try to exercise for at least 150 minutes each week. The type of exercise that you do should increase your heart rate and make you sweat. This is known as moderate-intensity exercise.  Try to do strengthening exercises at least twice each week. Do these in addition to the moderate-intensity exercise.  Know your numbers.Ask your health care provider to check your cholesterol and your blood glucose. Continue to have your blood tested as directed by your health care provider. WHAT SHOULD I KNOW ABOUT CANCER SCREENING? There are several types of cancer. Take the following steps to reduce your risk and to catch any cancer development as early as possible. Breast Cancer  Practice breast self-awareness.  This means understanding how your breasts normally appear and feel.  It also means doing regular breast self-exams. Let your health care provider know about any changes, no matter how small.  If you are 40 or older, have a clinician do a   breast exam (clinical breast exam or CBE) every year. Depending on your age, family history, and medical history, it may be recommended that you also have a yearly breast X-ray (mammogram).  If you have a family history of breast cancer,  talk with your health care provider about genetic screening.  If you are at high risk for breast cancer, talk with your health care provider about having an MRI and a mammogram every year.  Breast cancer (BRCA) gene test is recommended for women who have family members with BRCA-related cancers. Results of the assessment will determine the need for genetic counseling and BRCA1 and for BRCA2 testing. BRCA-related cancers include these types:  Breast. This occurs in males or females.  Ovarian.  Tubal. This may also be called fallopian tube cancer.  Cancer of the abdominal or pelvic lining (peritoneal cancer).  Prostate.  Pancreatic. Cervical, Uterine, and Ovarian Cancer Your health care provider may recommend that you be screened regularly for cancer of the pelvic organs. These include your ovaries, uterus, and vagina. This screening involves a pelvic exam, which includes checking for microscopic changes to the surface of your cervix (Pap test).  For women ages 21-65, health care providers may recommend a pelvic exam and a Pap test every three years. For women ages 77-65, they may recommend the Pap test and pelvic exam, combined with testing for human papilloma virus (HPV), every five years. Some types of HPV increase your risk of cervical cancer. Testing for HPV may also be done on women of any age who have unclear Pap test results.  Other health care providers may not recommend any screening for nonpregnant women who are considered low risk for pelvic cancer and have no symptoms. Ask your health care provider if a screening pelvic exam is right for you.  If you have had past treatment for cervical cancer or a condition that could lead to cancer, you need Pap tests and screening for cancer for at least 20 years after your treatment. If Pap tests have been discontinued for you, your risk factors (such as having a new sexual partner) need to be reassessed to determine if you should start having  screenings again. Some women have medical problems that increase the chance of getting cervical cancer. In these cases, your health care provider may recommend that you have screening and Pap tests more often.  If you have a family history of uterine cancer or ovarian cancer, talk with your health care provider about genetic screening.  If you have vaginal bleeding after reaching menopause, tell your health care provider.  There are currently no reliable tests available to screen for ovarian cancer. Lung Cancer Lung cancer screening is recommended for adults 3-70 years old who are at high risk for lung cancer because of a history of smoking. A yearly low-dose CT scan of the lungs is recommended if you:  Currently smoke.  Have a history of at least 30 pack-years of smoking and you currently smoke or have quit within the past 15 years. A pack-year is smoking an average of one pack of cigarettes per day for one year. Yearly screening should:  Continue until it has been 15 years since you quit.  Stop if you develop a health problem that would prevent you from having lung cancer treatment. Colorectal Cancer  This type of cancer can be detected and can often be prevented.  Routine colorectal cancer screening usually begins at age 38 and continues through age 12.  If you have  risk factors for colon cancer, your health care provider may recommend that you be screened at an earlier age.  If you have a family history of colorectal cancer, talk with your health care provider about genetic screening.  Your health care provider may also recommend using home test kits to check for hidden blood in your stool.  A small camera at the end of a tube can be used to examine your colon directly (sigmoidoscopy or colonoscopy). This is done to check for the earliest forms of colorectal cancer.  Direct examination of the colon should be repeated every 5-10 years until age 67. However, if early forms of  precancerous polyps or small growths are found or if you have a family history or genetic risk for colorectal cancer, you may need to be screened more often. Skin Cancer  Check your skin from head to toe regularly.  Monitor any moles. Be sure to tell your health care provider:  About any new moles or changes in moles, especially if there is a change in a mole's shape or color.  If you have a mole that is larger than the size of a pencil eraser.  If any of your family members has a history of skin cancer, especially at a young age, talk with your health care provider about genetic screening.  Always use sunscreen. Apply sunscreen liberally and repeatedly throughout the day.  Whenever you are outside, protect yourself by wearing long sleeves, pants, a wide-brimmed hat, and sunglasses. WHAT SHOULD I KNOW ABOUT OSTEOPOROSIS? Osteoporosis is a condition in which bone destruction happens more quickly than new bone creation. After menopause, you may be at an increased risk for osteoporosis. To help prevent osteoporosis or the bone fractures that can happen because of osteoporosis, the following is recommended:  If you are 39-61 years old, get at least 1,000 mg of calcium and at least 600 mg of vitamin D per day.  If you are older than age 16 but younger than age 7, get at least 1,200 mg of calcium and at least 600 mg of vitamin D per day.  If you are older than age 47, get at least 1,200 mg of calcium and at least 800 mg of vitamin D per day. Smoking and excessive alcohol intake increase the risk of osteoporosis. Eat foods that are rich in calcium and vitamin D, and do weight-bearing exercises several times each week as directed by your health care provider. WHAT SHOULD I KNOW ABOUT HOW MENOPAUSE AFFECTS [place]? Depression may occur at any age, but it is more common as you become older. Common symptoms of depression include:  Low or sad mood.  Changes in sleep patterns.  Changes  in appetite or eating patterns.  Feeling an overall lack of motivation or enjoyment of activities that you previously enjoyed.  Frequent crying spells. Talk with your health care provider if you think that you are experiencing depression. WHAT SHOULD I KNOW ABOUT IMMUNIZATIONS? It is important that you get and maintain your immunizations. These include:  Tetanus, diphtheria, and pertussis (Tdap) booster vaccine.  Influenza every year before the flu season begins.  Pneumonia vaccine.  Shingles vaccine. Your health care provider may also recommend other immunizations.   This information is not intended to replace advice given to you by your health care provider. Make sure you discuss any questions you have with your health care provider.   Document Released: 09/28/2005 Document Revised: 08/27/2014 Document Reviewed: 04/08/2014 Elsevier Interactive Patient Education 2016 Elsevier  Inc[employer]   - Diabetes Mellitus and Food It is important for you to manage your blood sugar (glucose) level. Your blood glucose level can be greatly affected by what you eat. Eating healthier foods in the appropriate amounts throughout the day at about the same time each day will help you control your blood glucose level. It can also help slow or prevent worsening of your diabetes mellitus. Healthy eating may even help you improve the level of your blood pressure and reach or maintain a healthy weight.  General recommendations for healthful eating and cooking habits include:  Eating meals and snacks regularly. Avoid going long periods of time without eating to lose weight.  Eating a diet that consists mainly of plant-based foods, such as fruits, vegetables, nuts, legumes, and whole grains.  Using low-heat cooking methods, such as baking, instead of high-heat cooking methods, such as deep frying. Work with your dietitian to make sure you understand how to use the Nutrition Facts information on food labels. HOW CAN  FOOD AFFECT ME? Carbohydrates Carbohydrates affect your blood glucose level more than any other type of food. Your dietitian will help you determine how many carbohydrates to eat at each meal and teach you how to count carbohydrates. Counting carbohydrates is important to keep your blood glucose at a healthy level, especially if you are using insulin or taking certain medicines for diabetes mellitus. Alcohol Alcohol can cause sudden decreases in blood glucose (hypoglycemia), especially if you use insulin or take certain medicines for diabetes mellitus. Hypoglycemia can be a life-threatening condition. Symptoms of hypoglycemia (sleepiness, dizziness, and disorientation) are similar to symptoms of having too much alcohol.  If your health care provider has given you approval to drink alcohol, do so in moderation and use the following guidelines:  Women should not have more than one drink per day, and men should not have more than two drinks per day. One drink is equal to:  12 oz of beer.  5 oz of wine.  1 oz of hard liquor.  Do not drink on an empty stomach.  Keep yourself hydrated. Have water, diet soda, or unsweetened iced tea.  Regular soda, juice, and other mixers might contain a lot of carbohydrates and should be counted. WHAT FOODS ARE NOT RECOMMENDED? As you make food choices, it is important to remember that all foods are not the same. Some foods have fewer nutrients per serving than other foods, even though they might have the same number of calories or carbohydrates. It is difficult to get your body what it needs when you eat foods with fewer nutrients. Examples of foods that you should avoid that are high in calories and carbohydrates but low in nutrients include:  Trans fats (most processed foods list trans fats on the Nutrition Facts label).  Regular soda.  Juice.  Candy.  Sweets, such as cake, pie, doughnuts, and cookies.  Fried foods. WHAT FOODS CAN I EAT? Eat  nutrient-rich foods, which will nourish your body and keep you healthy. The food you should eat also will depend on several factors, including:  The calories you need.  The medicines you take.  Your weight.  Your blood glucose level.  Your blood pressure level.  Your cholesterol level. You should eat a variety of foods, including:  Protein.  Lean cuts of meat.  Proteins low in saturated fats, such as fish, egg whites, and beans. Avoid processed meats.  Fruits and vegetables.  Fruits and vegetables that may help control blood glucose levels,  such as apples, mangoes, and yams.  Dairy products.  Choose fat-free or low-fat dairy products, such as milk, yogurt, and cheese.  Grains, bread, pasta, and rice.  Choose whole grain products, such as multigrain bread, whole oats, and brown rice. These foods may help control blood pressure.  Fats.  Foods containing healthful fats, such as nuts, avocado, olive oil, canola oil, and fish. DOES EVERYONE WITH DIABETES MELLITUS HAVE THE SAME MEAL PLAN? Because every person with diabetes mellitus is different, there is not one meal plan that works for everyone. It is very important that you meet with a dietitian who will help you create a meal plan that is just right for you.   This information is not intended to replace advice given to you by your health care provider. Make sure you discuss any questions you have with your health care provider.   Document Released: 05/03/2005 Document Revised: 08/27/2014 Document Reviewed: 07/03/2013 Elsevier Interactive Patient Education 2016 [place] for Eating Away From Home If You Have Diabetes Controlling your level of blood glucose, also known as blood sugar, can be challenging. It can be even more difficult when you do not prepare your own meals. The following tips can help you manage your diabetes when you eat away from home. PLANNING AHEAD Plan ahead if you know you will be eating  away from home:  Ask your health care provider how to time meals and medicine if you are taking insulin.  Make a list of restaurants near you that offer healthy choices. If they have a carry-out menu, take it home and plan what you will order ahead of time.  Look up the restaurant you want to eat at online. Many chain and fast-food restaurants list nutritional information online. Use this information to choose the healthiest options and to calculate how many carbohydrates will be in your meal.  Use a carbohydrate-counting book or mobile app to look up the carbohydrate content and serving size of the foods you want to eat.  Become familiar with serving sizes and learn to recognize how many servings are in a portion. This will allow you to estimate how many carbohydrates you can eat. FREE FOODS A "free food" is any food or drink that has less than 5 g of carbohydrates per serving. Free foods include:  Many vegetables.  Hard boiled eggs.  Nuts or seeds.  Olives.  Cheeses.  Meats. These types of foods make good appetizer choices and are often available at salad bars. Lemon juice, vinegar, or a low-calorie salad dressing of fewer than 20 calories per serving can be used as a "free" salad dressing.  CHOICES TO REDUCE CARBOHYDRATES  Substitute nonfat sweetened yogurt with a sugar-free yogurt. Yogurt made from soy milk may also be used, but you will still want a sugar-free or plain option to choose a lower carbohydrate amount.  Ask your server to take away the bread basket or chips from your table.  Order fresh fruit. A salad bar often offers fresh fruit choices. Avoid canned fruit because it is usually packed in sugar or syrup.  Order a salad, and eat it without dressing. Or, create a "free" salad dressing.  Ask for substitutions. For example, instead of [place] fries, request an order of a vegetable such as salad, green beans, or broccoli. OTHER TIPS   If you take insulin, take the  insulin once your food arrives to your table. This will ensure your insulin and food are timed correctly.  Ask your server about the portion size before your order, and ask for a take-out box if the portion has more servings than you should have. When your food comes, leave the amount you should have on the plate, and put the rest in the take-out box.  Consider splitting an entree with someone and ordering a side salad.   This information is not intended to replace advice given to you by your health care provider. Make sure you discuss any questions you have with your health care provider.   Document Released: 08/06/2005 Document Revised: 04/27/2015 Document Reviewed: 11/03/2013 Elsevier Interactive Patient Education 2016 [place].   -  Muscle Strain A muscle strain (pulled muscle) happens when a muscle is stretched beyond normal length. It happens when a sudden, violent force stretches your muscle too far. Usually, a few of the fibers in your muscle are torn. Muscle strain is common in athletes. Recovery usually takes 1-2 weeks. Complete healing takes 5-6 weeks.  HOME CARE   Follow the PRICE method of treatment to help your injury get better. Do this the first 2-3 days after the injury:  Protect. Protect the muscle to keep it from getting injured again.  Rest. Limit your activity and rest the injured body part.  Ice. Put ice in a plastic bag. Place a towel between your skin and the bag. Then, apply the ice and leave it on from 15-20 minutes each hour. After the third day, switch to moist heat packs.  Compression. Use a splint or elastic bandage on the injured area for comfort. Do not put it on too tightly.  Elevate. Keep the injured body part above the level of your heart.  Only take medicine as told by your doctor.  Warm up before doing exercise to prevent future muscle strains. GET HELP IF:   You have more pain or puffiness (swelling) in the injured area.  You feel  numbness, tingling, or notice a loss of strength in the injured area. MAKE SURE YOU:   Understand these instructions.  Will watch your condition.  Will get help right away if you are not doing well or get worse.   This information is not intended to replace advice given to you by your health care provider. Make sure you discuss any questions you have with your health care provider.   Document Released: 05/15/2008 Document Revised: 05/27/2013 Document Reviewed: 03/05/2013 Elsevier Interactive Patient Education [employer].
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
## 2016-06-27 ENCOUNTER — Telehealth: Payer: Self-pay

## 2016-06-27 NOTE — Telephone Encounter (Signed)
Pt returned call to go over lab results pt is aware of results and doesn't have any questions or concerns  

## 2016-06-27 NOTE — Telephone Encounter (Signed)
Contacted pt to go over lab results pt didn't answer and was unable to lvm due to mailbox being full

## 2016-07-17 ENCOUNTER — Other Ambulatory Visit: Payer: Self-pay | Admitting: Internal Medicine

## 2016-07-17 MED ORDER — TRAMADOL HCL 50 MG PO TABS
50.0000 mg | ORAL_TABLET | Freq: Three times a day (TID) | ORAL | 0 refills | Status: DC | PRN
Start: 2016-07-17 — End: 2016-09-03

## 2016-07-17 NOTE — Progress Notes (Unsigned)
Please call to pick up ultram refill

## 2016-07-23 ENCOUNTER — Other Ambulatory Visit: Payer: Self-pay | Admitting: Internal Medicine

## 2016-07-23 MED ORDER — PRAVASTATIN SODIUM 20 MG PO TABS: 20.0000 mg | ORAL_TABLET | Freq: Every day | ORAL | 3 refills | Status: DC

## 2016-07-23 NOTE — Progress Notes (Unsigned)
To start pravastatin 20 qhs for dm.

## 2016-07-27 ENCOUNTER — Other Ambulatory Visit: Payer: Self-pay

## 2016-07-27 MED ORDER — PRAVASTATIN SODIUM 20 MG PO TABS: 20.0000 mg | ORAL_TABLET | Freq: Every day | ORAL | 3 refills | Status: DC

## 2016-08-02 ENCOUNTER — Encounter: Payer: Self-pay | Admitting: Internal Medicine

## 2016-08-11 IMAGING — DX DG HIP (WITH OR WITHOUT PELVIS) 2-3V*R*
3 series · 3 of 3 positions shown · non-contrast
Comparison: None.

CLINICAL DATA: 73-year-old who fell off of a ladder six days ago,
landing on the low back and right hip. Persistent right low back
pain and right posterior hip pain. Initial encounter. Remote L4-5
disc herniation [REDACTED].

EXAM:
DG HIP (WITH OR WITHOUT PELVIS) 2-3V RIGHT

[t pelvis ap]
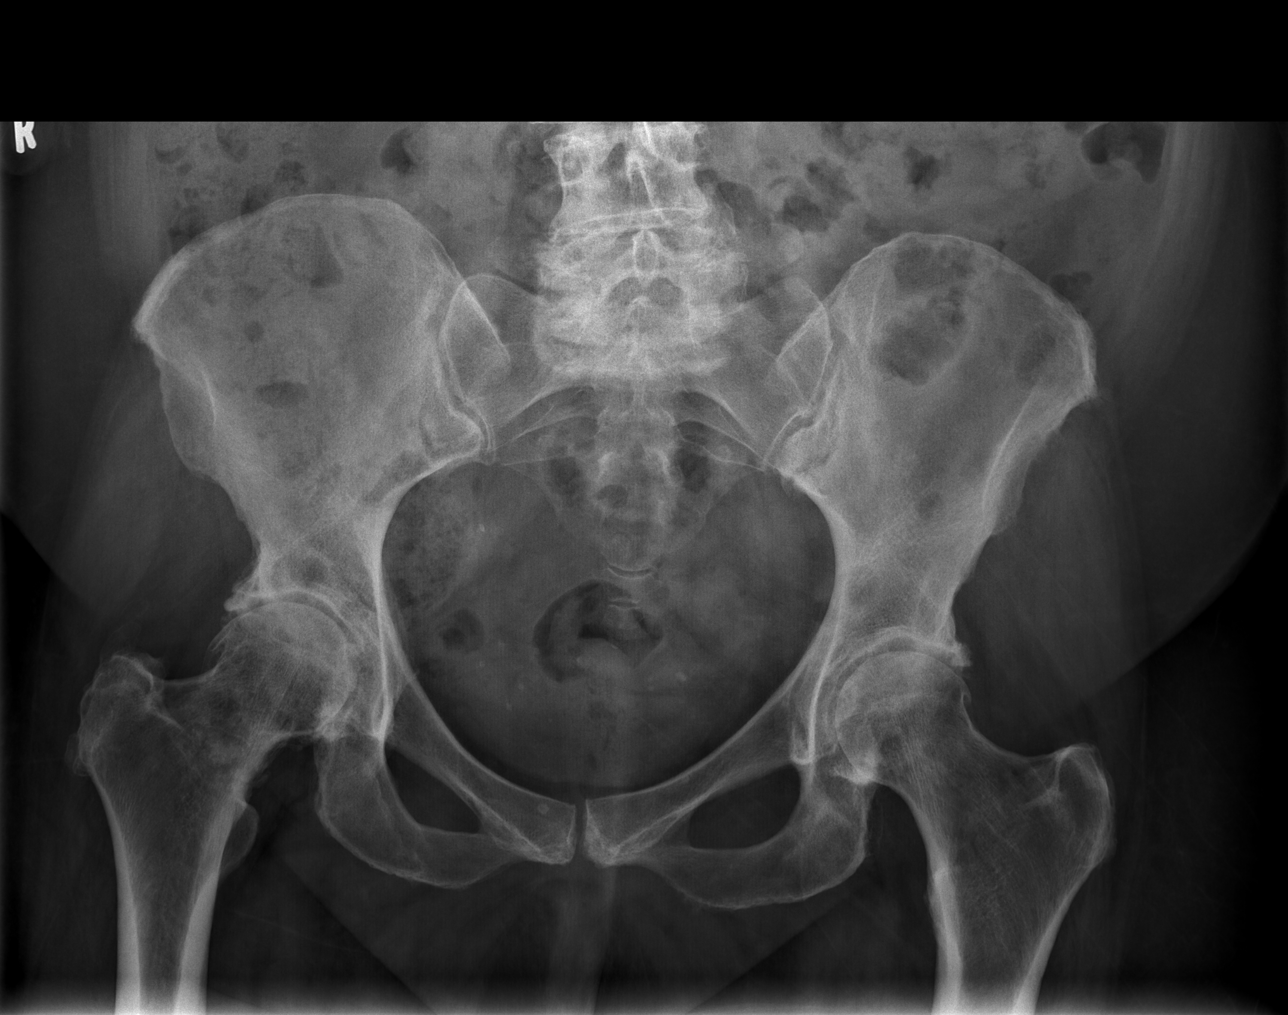

[t hip ap right]
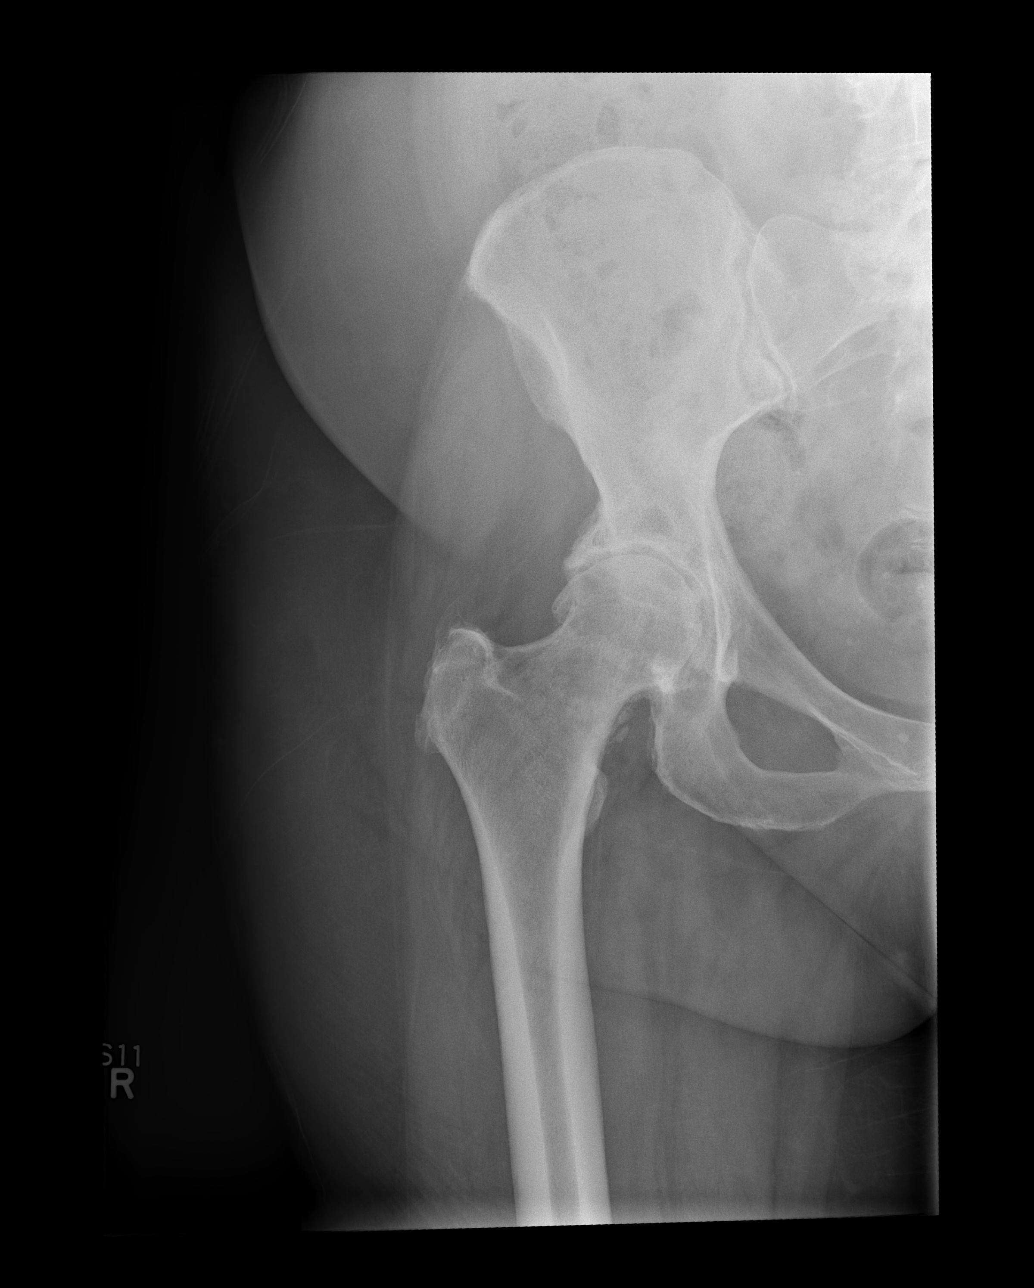

[t hip frog leg right]
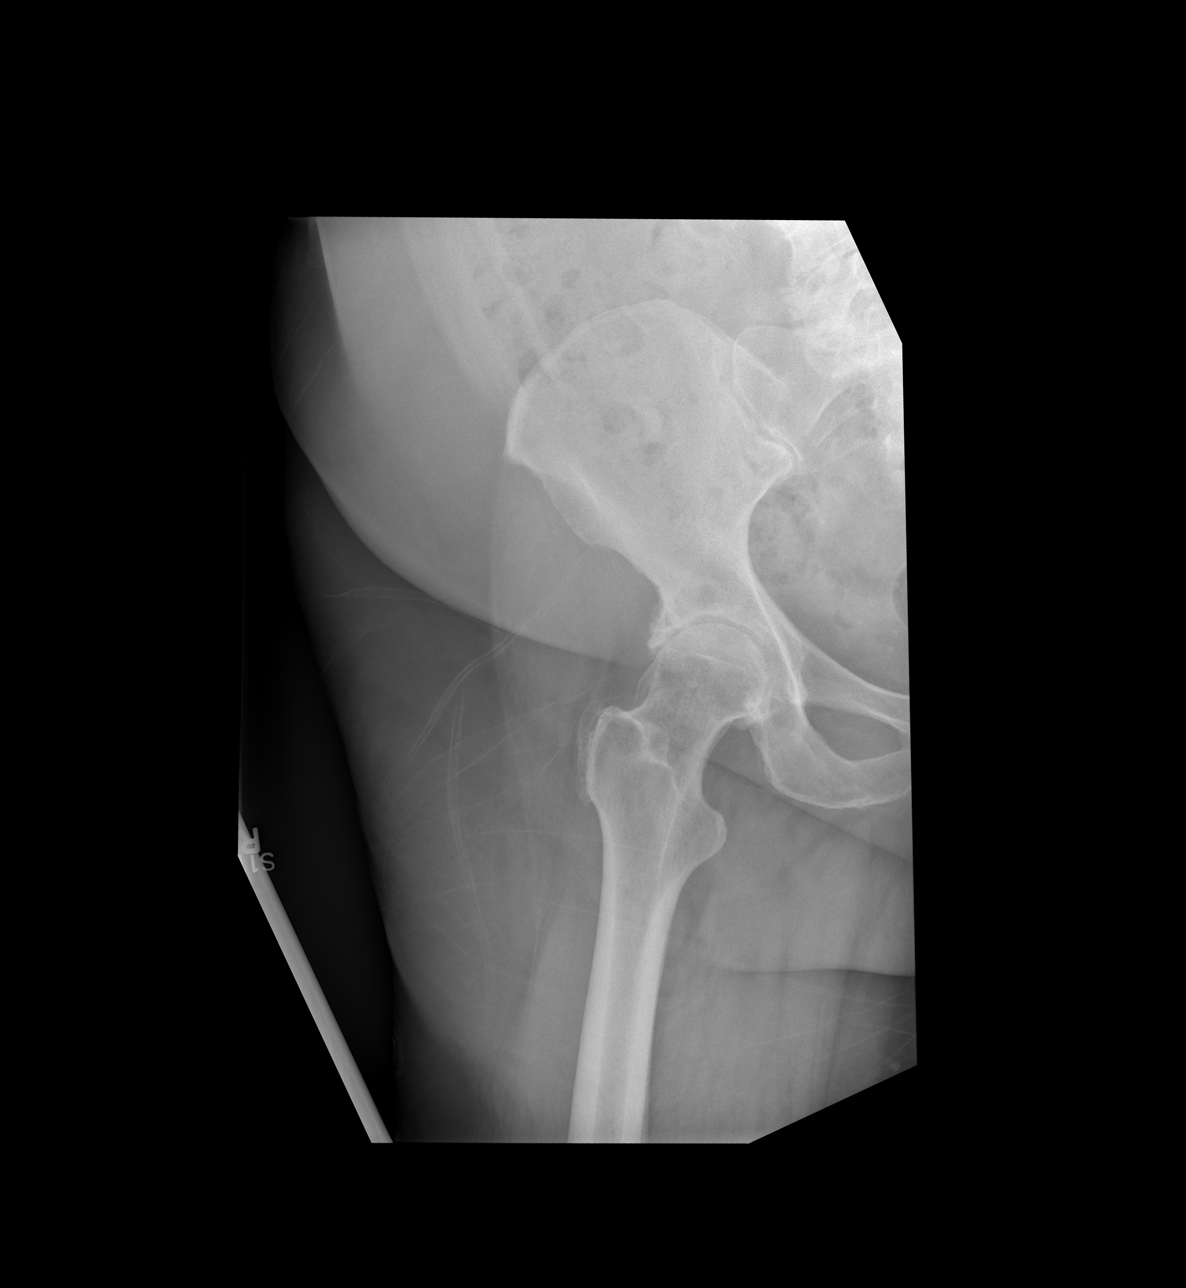

[3 of 3 positions shown; findings below may reference images not displayed]

FINDINGS: No evidence of acute fracture or dislocation. Severe diffuse joint
space narrowing with associated hypertrophic spurring involving the
femoral head and the acetabular roof. Calcified loose bodies
suspected within the joint. Bone mineral density well-preserved for
age.

Included AP pelvis demonstrates moderate narrowing of the joint
space of the contralateral left hip. Sacroiliac joints and symphysis
pubis intact. Degenerative changes involving the visualized lower
lumbar spine.
IMPRESSION: 1. No acute or subacute osseous abnormality.
2. Severe osteoarthritis involving the right hip. Possible calcified
loose bodies within the joint
3. Moderate osteoarthritis involving the contralateral left hip.

## 2016-08-30 ENCOUNTER — Telehealth: Payer: Self-pay | Admitting: Internal Medicine

## 2016-08-30 NOTE — Telephone Encounter (Signed)
Patient is needing a refill for tramadol. Please follow up

## 2016-09-03 MED ORDER — TRAMADOL HCL 50 MG PO TABS: 50.0000 mg | ORAL_TABLET | Freq: Three times a day (TID) | ORAL | 0 refills | Status: DC | PRN

## 2016-09-03 NOTE — Telephone Encounter (Signed)
Call her to pick up. Thanks.

## 2016-10-03 ENCOUNTER — Telehealth: Payer: Self-pay | Admitting: Internal Medicine

## 2016-10-03 DIAGNOSIS — E1142 Type 2 diabetes mellitus with diabetic polyneuropathy: Secondary | ICD-10-CM

## 2016-10-03 NOTE — Telephone Encounter (Signed)
Will contact pt and informed pt that she will need to reschedule appointment

## 2016-10-03 NOTE — Telephone Encounter (Signed)
[facility] called to inform that patient had an appt for a Diabetic Eye Exam and patient was a no-show to her appt.

## 2016-10-15 MED ORDER — TRAMADOL HCL 50 MG PO TABS: 50.0000 mg | ORAL_TABLET | Freq: Three times a day (TID) | ORAL | 0 refills | Status: DC | PRN

## 2016-10-15 MED ORDER — GABAPENTIN 300 MG PO CAPS: 300.0000 mg | ORAL_CAPSULE | Freq: Three times a day (TID) | ORAL | 3 refills | Status: DC

## 2016-10-15 MED FILL — GABAPENTIN 300 MG CAPSULE: 300 | 30 days supply | Qty: 90 | Fill #0

## 2016-10-15 NOTE — Telephone Encounter (Signed)
Pt. Came into facility requesting a refill on Tramadol and Gabapentin.  Please f/u

## 2016-10-15 NOTE — Telephone Encounter (Signed)
Renewed her neurontin and ultram, needs to come in pick up ultram. thanks

## 2016-11-23 ENCOUNTER — Other Ambulatory Visit: Payer: Self-pay | Admitting: Internal Medicine

## 2016-11-23 DIAGNOSIS — E1142 Type 2 diabetes mellitus with diabetic polyneuropathy: Secondary | ICD-10-CM

## 2016-11-23 NOTE — Telephone Encounter (Signed)
Pt. Came to facility requesting a refill on the following medication:   pravastatin (PRAVACHOL) 20 MG tablet   gabapentin (NEURONTIN) 300 MG capsule  metFORMIN (GLUCOPHAGE XR) 500 MG 24 hr tablet   Pt. Would like her Rx [facility] to [facility] [address].  Please f/u

## 2016-11-23 NOTE — Telephone Encounter (Signed)
Pt. Came to facility requesting a refill on Tramadol. Please f/u °

## 2016-11-26 ENCOUNTER — Other Ambulatory Visit: Payer: Self-pay | Admitting: Internal Medicine

## 2016-11-26 DIAGNOSIS — E1142 Type 2 diabetes mellitus with diabetic polyneuropathy: Secondary | ICD-10-CM

## 2016-11-26 MED ORDER — TRAMADOL HCL 50 MG PO TABS
50.0000 mg | ORAL_TABLET | Freq: Three times a day (TID) | ORAL | 0 refills | Status: DC | PRN
Start: 2016-11-26 — End: 2017-01-01

## 2016-11-26 MED ORDER — GABAPENTIN 300 MG PO CAPS: 300.0000 mg | ORAL_CAPSULE | Freq: Three times a day (TID) | ORAL | 3 refills | Status: DC

## 2016-11-27 ENCOUNTER — Other Ambulatory Visit: Payer: Self-pay

## 2016-11-27 DIAGNOSIS — Z0283 Encounter for blood-alcohol and blood-drug test: Secondary | ICD-10-CM

## 2016-11-29 ENCOUNTER — Ambulatory Visit: Payer: Commercial Managed Care - HMO | Attending: Internal Medicine

## 2016-11-29 ENCOUNTER — Other Ambulatory Visit: Payer: Self-pay

## 2016-11-29 ENCOUNTER — Other Ambulatory Visit: Payer: Self-pay | Admitting: Internal Medicine

## 2016-11-29 ENCOUNTER — Other Ambulatory Visit: Payer: Self-pay | Admitting: Family Medicine

## 2016-11-29 DIAGNOSIS — Z0283 Encounter for blood-alcohol and blood-drug test: Secondary | ICD-10-CM | POA: Insufficient documentation

## 2016-11-29 DIAGNOSIS — E1142 Type 2 diabetes mellitus with diabetic polyneuropathy: Secondary | ICD-10-CM

## 2016-11-29 NOTE — Progress Notes (Signed)
Patient her for lab visit only 

## 2016-11-30 LAB — DRUG SCREEN, URINE
Amphetamines, Urine: NEGATIVE ng/mL
BENZODIAZEPINE QUANT UR: NEGATIVE ng/mL
Barbiturate screen, urine: NEGATIVE ng/mL
Cannabinoid Quant, Ur: NEGATIVE ng/mL
Cocaine (Metab.): NEGATIVE ng/mL
OPIATE QUANT UR: POSITIVE ng/mL
PCP QUANT UR: NEGATIVE ng/mL

## 2017-01-01 ENCOUNTER — Ambulatory Visit: Payer: Commercial Managed Care - HMO | Attending: Internal Medicine | Admitting: Internal Medicine

## 2017-01-01 ENCOUNTER — Encounter: Payer: Self-pay | Admitting: Internal Medicine

## 2017-01-01 VITALS — BP 149/72 | HR 90 | Temp 97.7°F | Resp 16 | Wt 183.6 lb

## 2017-01-01 DIAGNOSIS — E1149 Type 2 diabetes mellitus with other diabetic neurological complication: Secondary | ICD-10-CM | POA: Insufficient documentation

## 2017-01-01 DIAGNOSIS — G8929 Other chronic pain: Secondary | ICD-10-CM | POA: Insufficient documentation

## 2017-01-01 DIAGNOSIS — Z1321 Encounter for screening for nutritional disorder: Secondary | ICD-10-CM

## 2017-01-01 DIAGNOSIS — E559 Vitamin D deficiency, unspecified: Secondary | ICD-10-CM | POA: Diagnosis not present

## 2017-01-01 DIAGNOSIS — Z7984 Long term (current) use of oral hypoglycemic drugs: Secondary | ICD-10-CM | POA: Diagnosis not present

## 2017-01-01 DIAGNOSIS — R350 Frequency of micturition: Secondary | ICD-10-CM | POA: Insufficient documentation

## 2017-01-01 DIAGNOSIS — E1142 Type 2 diabetes mellitus with diabetic polyneuropathy: Secondary | ICD-10-CM | POA: Diagnosis not present

## 2017-01-01 DIAGNOSIS — Z5900 Homelessness unspecified: Secondary | ICD-10-CM

## 2017-01-01 DIAGNOSIS — I10 Essential (primary) hypertension: Secondary | ICD-10-CM | POA: Insufficient documentation

## 2017-01-01 DIAGNOSIS — Z8673 Personal history of transient ischemic attack (TIA), and cerebral infarction without residual deficits: Secondary | ICD-10-CM | POA: Diagnosis not present

## 2017-01-01 DIAGNOSIS — L989 Disorder of the skin and subcutaneous tissue, unspecified: Secondary | ICD-10-CM | POA: Insufficient documentation

## 2017-01-01 DIAGNOSIS — E119 Type 2 diabetes mellitus without complications: Secondary | ICD-10-CM | POA: Diagnosis present

## 2017-01-01 DIAGNOSIS — M545 Low back pain: Secondary | ICD-10-CM

## 2017-01-01 LAB — POCT GLYCOSYLATED HEMOGLOBIN (HGB A1C): Hemoglobin A1C: 6.6

## 2017-01-01 LAB — POCT UA - MICROALBUMIN
Albumin/Creatinine Ratio, Urine, POC: <30
Creatinine, POC: 200 mg/dL
Microalbumin Ur, POC: 30 mg/L

## 2017-01-01 LAB — GLUCOSE, POCT (MANUAL RESULT ENTRY): POC Glucose: 161 mg/dl — AB (ref 70–99)

## 2017-01-01 MED ORDER — PRAVASTATIN SODIUM 20 MG PO TABS: 20.0000 mg | ORAL_TABLET | Freq: Every day | ORAL | 3 refills | Status: DC

## 2017-01-01 MED ORDER — LISINOPRIL 5 MG PO TABS: 5.0000 mg | ORAL_TABLET | Freq: Every day | ORAL | 3 refills | Status: DC

## 2017-01-01 MED ORDER — METFORMIN HCL ER 500 MG PO TB24: 500.0000 mg | ORAL_TABLET | Freq: Every day | ORAL | 3 refills | Status: DC

## 2017-01-01 MED ORDER — OXYBUTYNIN CHLORIDE ER 5 MG PO TB24: 5.0000 mg | ORAL_TABLET | Freq: Every day | ORAL | 3 refills | Status: DC

## 2017-01-01 MED ORDER — TRAMADOL HCL 50 MG PO TABS: 50.0000 mg | ORAL_TABLET | Freq: Three times a day (TID) | ORAL | 0 refills | Status: DC | PRN

## 2017-01-01 MED ORDER — GABAPENTIN 300 MG PO CAPS: 300.0000 mg | ORAL_CAPSULE | Freq: Three times a day (TID) | ORAL | 3 refills | Status: DC

## 2017-01-01 MED FILL — LISINOPRIL 5 MG TAB: 5 | 30 days supply | Qty: 30 | Fill #0

## 2017-01-01 MED FILL — GABAPENTIN 300 MG CAPSULE: 300 | 30 days supply | Qty: 90 | Fill #0

## 2017-01-01 MED FILL — OXYBUTYNIN CL ER 5 MG TAB: 5 | 30 days supply | Qty: 30 | Fill #0

## 2017-01-01 NOTE — Progress Notes (Signed)
Donna Nguyen, is a 76 y.o. female  MEQ:[id number]  WLN:[id number]  DOB - 1941/01/10  Chief Complaint  Patient presents with  . Diabetes        Subjective:   Donna Nguyen is a 76 y.o. female here today for a follow up visit, last seen 11/17, here w/ a long list of complaints. She has hx of chronic pains for which she takes neurontin and ultram for, which she requested renewal.  She notes that she is no longer living in her no electric RV, but staying in sunroom that has heat/electricity. She takes showers at the [facility].  She notes that she found numerous bug bites, ticks on her recently. Denies rash that looked like "bulls eye" though. She also noted bee sting months ago that caused some swelling/fevers, but resolved.  She had bad "flu" about 4 months ago, and now appears to be getting back to normal. But does c/o of significant hot flashes, she is postmenopausal - had "hot flashes" years ago so does not think this is hot flashes.    She does complain of urinary frequency, but does not recall ever picking up the rx of ditropan to try.  Co of blurry vision bilat, has not had eye exam in a while.  She had referral in Nov, but never followed up.  She started taking her metformin last month and is tolerating it. She had a bout of loose stools months ago, but that has resolved.  Patient has No headache, No chest pain, No abdominal pain - No Nausea, No new weakness tingling or numbness, No Cough - SOB.  No problems updated.  ALLERGIES: Allergies  Allergen Reactions  . Acetaminophen Shortness Of Breath  . Amitriptyline   . Ciprofloxacin   . Epinephrine   . Penicillins   . Metformin And Related Palpitations    PAST MEDICAL HISTORY: Past Medical History:  Diagnosis Date  . Diabetes mellitus without complication ([place])   . Hypertension   . MI (mitral incompetence)   . Stroke [facility])     MEDICATIONS AT HOME: Prior to Admission medications   Medication Sig Start Date End Date  Taking? Authorizing Provider  gabapentin (NEURONTIN) 300 MG capsule Take 1 capsule (300 mg total) by mouth 3 (three) times daily. 01/01/17   Anthonny Schiller, Leda Quail, MD  lisinopril (PRINIVIL,ZESTRIL) 5 MG tablet Take 1 tablet (5 mg total) by mouth daily. 01/01/17   Maren Reamer, MD  metFORMIN (GLUCOPHAGE XR) 500 MG 24 hr tablet Take 1 tablet (500 mg total) by mouth daily with breakfast. 01/01/17   Yeraldy Spike, Leda Quail, MD  NYSTATIN, TOPICAL, POWD Apply topically.    [provider]  oxybutynin (DITROPAN XL) 5 MG 24 hr tablet Take 1 tablet (5 mg total) by mouth at bedtime. 01/01/17   Maren Reamer, MD  pravastatin (PRAVACHOL) 20 MG tablet Take 1 tablet (20 mg total) by mouth daily. 01/01/17   Maren Reamer, MD  traMADol (ULTRAM) 50 MG tablet Take 1 tablet (50 mg total) by mouth every 8 (eight) hours as needed. for pain 01/01/17   Lottie Mussel T, MD     Objective:   Vitals:   01/01/17 1138  BP: (!) 149/72  Pulse: 90  Resp: 16  Temp: 97.7 F (36.5 C)  TempSrc: Oral  SpO2: 95%  Weight: 183 lb 9.6 oz (83.3 kg)    Exam General appearance : Awake, alert, not in any distress. Speech Clear. Not toxic looking, obese, pleasant. rom intact but slow,  needed assistance down from examining table. HEENT: Atraumatic and Normocephalic, pupils equally reactive to light. Neck: supple, no JVD.  Chest:Good air entry bilaterally, no added sounds. CVS: S1 S2 regular, no murmurs/gallups or rubs. Abdomen: Bowel sounds active, Non tender and not distended with no gaurding, rigidity or rebound. Extremities: numerous bug bites/excoriatons on her back/thighs. 1 area noted on her right gluteal region of erythema about 1 cm dm, no fluctulance/pus noted.  B/L Lower Ext shows no edema, both legs are warm to touch.  No erythema migrans noted throughout back/thigs. Neurology: Awake alert, and oriented X 3, CN II-XII grossly intact, Non focal Skin:No Rash  Data Review Lab Results  Component Value Date    HGBA1C 6.6 01/01/2017   HGBA1C 7.1 06/20/2016   HGBA1C 6.8 10/28/2015    Depression screen PHQ 2/9 01/01/2017 06/20/2016 12/16/2015 10/28/2015 07/08/2015  Decreased Interest 1 2 3  0 0  Down, Depressed, Hopeless 1 2 3  0 1  PHQ - 2 Score 2 4 6  0 1  Altered sleeping 3 3 3  - -  Tired, decreased energy 3 3 3  - -  Change in appetite 2 3 2  - -  Feeling bad or failure about yourself  2 2 1  - -  Trouble concentrating 2 2 3  - -  Moving slowly or fidgety/restless 1 1 2  - -  Suicidal thoughts 1 0 0 - -  PHQ-9 Score 16 18 20  - -      Assessment & Plan   1. 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  Dispense: 90 capsule; Refill: 3  2. Homeless single person Now living in "sunroom" but has power. No shower, but she takes shower at the [facility]. Will forward to [facility] to check on her  As well. - appreciate help  3. HTN (hypertension), benign Elevated today, w/ hx of elevated bps in past as well on separate visits. Will start lisinopril 5 qd Low salt diet recd/encouraged - fu w/ RN Carilyn Goodpasture 2 wks for bp check. If sbp >130, than increase lisinopril to 10 qd if renal function is nml (labs drawn today)  4. Encounter for vitamin deficiency screening Encourage calcium 1200mg  /daily for now - VITAMIN D 25 Hydroxy (Vit-D Deficiency, Fractures)  5. Chronic low back pain without sciatica, unspecified back pain laterality - renewed ultram today - Drugs of abuse scrn w alc, routine urine  6. Skin lesions/excoriations Reassured pt, not concerning for lyme as long as she removes the tick within 24-48hrs. - of note,  not obvious lesions that would indicate lyme. - recd pt gets evaluated by pcp if notes rash typical of lyme/ie. Erythema migrans "bulls-eye sign")    Patient have been counseled extensively about nutrition and exercise  Return in about 4 weeks (around 01/29/2017) for chronic issues..  Pt had long list- I was able to get to most but not all problems today.  The patient was given clear instructions to go to ER or return to medical center if symptoms don't improve, worsen or new problems develop. The patient verbalized understanding. The patient was told to call to get lab results if they haven't heard  anything in the next week.   This note has been created with Surveyor, quantity. Any transcriptional errors are unintentional.   Maren Reamer, MD, [facility] [place], [place]   01/01/2017, 12:42 PM

## 2017-01-01 NOTE — Patient Instructions (Addendum)
2 wks fu W/ Donna Goodpasture RN for BP check-   Calcium 1200mg  /day Vit D - pending  -   Low-Sodium Eating Plan Sodium, which is an element that makes up salt, helps you maintain a healthy balance of fluids in your body. Too much sodium can increase your blood pressure and cause fluid and waste to be held in your body. Your health care provider or dietitian may recommend following this plan if you have high blood pressure (hypertension), kidney disease, liver disease, or heart failure. Eating less sodium can help lower your blood pressure, reduce swelling, and protect your heart, liver, and kidneys. What are tips for following this plan? General guidelines   Most people on this plan should limit their sodium intake to 1,500-2,000 mg (milligrams) of sodium each day. Reading food labels   The Nutrition Facts label lists the amount of sodium in one serving of the food. If you eat more than one serving, you must multiply the listed amount of sodium by the number of servings.  Choose foods with less than 140 mg of sodium per serving.  Avoid foods with 300 mg of sodium or more per serving. Shopping   Look for lower-sodium products, often labeled as "low-sodium" or "no salt added."  Always check the sodium content even if foods are labeled as "unsalted" or "no salt added".  Buy fresh foods.  Avoid canned foods and premade or frozen meals.  Avoid canned, cured, or processed meats  Buy breads that have less than 80 mg of sodium per slice. Cooking   Eat more home-cooked food and less restaurant, buffet, and fast food.  Avoid adding salt when cooking. Use salt-free seasonings or herbs instead of table salt or sea salt. Check with your health care provider or pharmacist before using salt substitutes.  Cook with plant-based oils, such as canola, sunflower, or olive oil. Meal planning   When eating at a restaurant, ask that your food be prepared with less salt or no salt, if possible.  Avoid  foods that contain MSG (monosodium glutamate). MSG is sometimes added to [place] food, bouillon, and some canned foods. What foods are recommended? The items listed may not be a complete list. Talk with your dietitian about what dietary choices are best for you. Grains  Low-sodium cereals, including oats, puffed wheat and rice, and shredded wheat. Low-sodium crackers. Unsalted rice. Unsalted pasta. Low-sodium bread. Whole-grain breads and whole-grain pasta. Vegetables  Fresh or frozen vegetables. "No salt added" canned vegetables. "No salt added" tomato sauce and paste. Low-sodium or reduced-sodium tomato and vegetable juice. Fruits  Fresh, frozen, or canned fruit. Fruit juice. Meats and other protein foods  Fresh or frozen (no salt added) meat, poultry, seafood, and fish. Low-sodium canned tuna and salmon. Unsalted nuts. Dried peas, beans, and lentils without added salt. Unsalted canned beans. Eggs. Unsalted nut butters. Dairy  Milk. Soy milk. Cheese that is naturally low in sodium, such as ricotta cheese, fresh mozzarella, or Swiss cheese Low-sodium or reduced-sodium cheese. Cream cheese. Yogurt. Fats and oils  Unsalted butter. Unsalted margarine with no trans fat. Vegetable oils such as canola or olive oils. Seasonings and other foods  Fresh and dried herbs and spices. Salt-free seasonings. Low-sodium mustard and ketchup. Sodium-free salad dressing. Sodium-free light mayonnaise. Fresh or refrigerated horseradish. Lemon juice. Vinegar. Homemade, reduced-sodium, or low-sodium soups. Unsalted popcorn and pretzels. Low-salt or salt-free chips. What foods are not recommended? The items listed may not be a complete list. Talk with your dietitian about what  dietary choices are best for you. Grains  Instant hot cereals. Bread stuffing, pancake, and biscuit mixes. Croutons. Seasoned rice or pasta mixes. Noodle soup cups. Boxed or frozen macaroni and cheese. Regular salted crackers. Self-rising  flour. Vegetables  Sauerkraut, pickled vegetables, and relishes. Olives. [place] fries. Onion rings. Regular canned vegetables (not low-sodium or reduced-sodium). Regular canned tomato sauce and paste (not low-sodium or reduced-sodium). Regular tomato and vegetable juice (not low-sodium or reduced-sodium). Frozen vegetables in sauces. Meats and other protein foods  Meat or fish that is salted, canned, smoked, spiced, or pickled. Bacon, ham, sausage, hotdogs, corned beef, chipped beef, packaged lunch meats, salt pork, jerky, pickled herring, anchovies, regular canned tuna, sardines, salted nuts. Dairy  Processed cheese and cheese spreads. Cheese curds. Blue cheese. Feta cheese. String cheese. Regular cottage cheese. Buttermilk. Canned milk. Fats and oils  Salted butter. Regular margarine. Ghee. Bacon fat. Seasonings and other foods  Onion salt, garlic salt, seasoned salt, table salt, and sea salt. Canned and packaged gravies. Worcestershire sauce. Tartar sauce. Barbecue sauce. Teriyaki sauce. Soy sauce, including reduced-sodium. Steak sauce. Fish sauce. Oyster sauce. Cocktail sauce. Horseradish that you find on the shelf. Regular ketchup and mustard. Meat flavorings and tenderizers. Bouillon cubes. Hot sauce and Tabasco sauce. Premade or packaged marinades. Premade or packaged taco seasonings. Relishes. Regular salad dressings. Salsa. Potato and tortilla chips. Corn chips and puffs. Salted popcorn and pretzels. Canned or dried soups. Pizza. Frozen entrees and pot pies. Summary  Eating less sodium can help lower your blood pressure, reduce swelling, and protect your heart, liver, and kidneys.  Most people on this plan should limit their sodium intake to 1,500-2,000 mg (milligrams) of sodium each day.  Canned, boxed, and frozen foods are high in sodium. Restaurant foods, fast foods, and pizza are also very high in sodium. You also get sodium by adding salt to food.  Try to cook at home, eat more fresh  fruits and vegetables, and eat less fast food, canned, processed, or prepared foods. This information is not intended to replace advice given to you by your health care provider. Make sure you discuss any questions you have with your health care provider. Document Released: 01/26/2002 Document Revised: 07/30/2016 Document Reviewed: 07/30/2016 Elsevier Interactive Patient Education  2017 Elsevier Inc.  -   Hypertension Hypertension is another name for high blood pressure. High blood pressure forces your heart to work harder to pump blood. This can cause problems over time. There are two numbers in a blood pressure reading. There is a top number (systolic) over a bottom number (diastolic). It is best to have a blood pressure below 120/80. Healthy choices can help lower your blood pressure. You may need medicine to help lower your blood pressure if:  Your blood pressure cannot be lowered with healthy choices.  Your blood pressure is higher than 130/80. Follow these instructions at home: Eating and drinking   If directed, follow the DASH eating plan. This diet includes:  Filling half of your plate at each meal with fruits and vegetables.  Filling one quarter of your plate at each meal with whole grains. Whole grains include whole wheat pasta, brown rice, and whole grain bread.  Eating or drinking low-fat dairy products, such as skim milk or low-fat yogurt.  Filling one quarter of your plate at each meal with low-fat (lean) proteins. Low-fat proteins include fish, skinless chicken, eggs, beans, and tofu.  Avoiding fatty meat, cured and processed meat, or chicken with skin.  Avoiding premade or processed  food.  Eat less than 1,500 mg of salt (sodium) a day.  Limit alcohol use to no more than 1 drink a day for nonpregnant women and 2 drinks a day for men. One drink equals 12 oz of beer, 5 oz of wine, or 1 oz of hard liquor. Lifestyle   Work with your doctor to stay at a healthy weight  or to lose weight. Ask your doctor what the best weight is for you.  Get at least 30 minutes of exercise that causes your heart to beat faster (aerobic exercise) most days of the week. This may include walking, swimming, or biking.  Get at least 30 minutes of exercise that strengthens your muscles (resistance exercise) at least 3 days a week. This may include lifting weights or pilates.  Do not use any products that contain nicotine or tobacco. This includes cigarettes and e-cigarettes. If you need help quitting, ask your doctor.  Check your blood pressure at home as told by your doctor.  Keep all follow-up visits as told by your doctor. This is important. Medicines   Take over-the-counter and prescription medicines only as told by your doctor. Follow directions carefully.  Do not skip doses of blood pressure medicine. The medicine does not work as well if you skip doses. Skipping doses also puts you at risk for problems.  Ask your doctor about side effects or reactions to medicines that you should watch for. Contact a doctor if:  You think you are having a reaction to the medicine you are taking.  You have headaches that keep coming back (recurring).  You feel dizzy.  You have swelling in your ankles.  You have trouble with your vision. Get help right away if:  You get a very bad headache.  You start to feel confused.  You feel weak or numb.  You feel faint.  You get very bad pain in your:  Chest.  Belly (abdomen).  You throw up (vomit) more than once.  You have trouble breathing. Summary  Hypertension is another name for high blood pressure.  Making healthy choices can help lower blood pressure. If your blood pressure cannot be controlled with healthy choices, you may need to take medicine. This information is not intended to replace advice given to you by your health care provider. Make sure you discuss any questions you have with your health care  provider. Document Released: 01/23/2008 Document Revised: 07/04/2016 Document Reviewed: 07/04/2016 Elsevier Interactive Patient Education  2017 Elsevier Inc.  -  Diabetes Mellitus and Food It is important for you to manage your blood sugar (glucose) level. Your blood glucose level can be greatly affected by what you eat. Eating healthier foods in the appropriate amounts throughout the day at about the same time each day will help you control your blood glucose level. It can also help slow or prevent worsening of your diabetes mellitus. Healthy eating may even help you improve the level of your blood pressure and reach or maintain a healthy weight. General recommendations for healthful eating and cooking habits include:  Eating meals and snacks regularly. Avoid going long periods of time without eating to lose weight.  Eating a diet that consists mainly of plant-based foods, such as fruits, vegetables, nuts, legumes, and whole grains.  Using low-heat cooking methods, such as baking, instead of high-heat cooking methods, such as deep frying. Work with your dietitian to make sure you understand how to use the Nutrition Facts information on food labels. How can food affect  me? Carbohydrates  Carbohydrates affect your blood glucose level more than any other type of food. Your dietitian will help you determine how many carbohydrates to eat at each meal and teach you how to count carbohydrates. Counting carbohydrates is important to keep your blood glucose at a healthy level, especially if you are using insulin or taking certain medicines for diabetes mellitus. Alcohol  Alcohol can cause sudden decreases in blood glucose (hypoglycemia), especially if you use insulin or take certain medicines for diabetes mellitus. Hypoglycemia can be a life-threatening condition. Symptoms of hypoglycemia (sleepiness, dizziness, and disorientation) are similar to symptoms of having too much alcohol. If your health care  provider has given you approval to drink alcohol, do so in moderation and use the following guidelines:  Women should not have more than one drink per day, and men should not have more than two drinks per day. One drink is equal to:  12 oz of beer.  5 oz of wine.  1 oz of hard liquor.  Do not drink on an empty stomach.  Keep yourself hydrated. Have water, diet soda, or unsweetened iced tea.  Regular soda, juice, and other mixers might contain a lot of carbohydrates and should be counted. What foods are not recommended? As you make food choices, it is important to remember that all foods are not the same. Some foods have fewer nutrients per serving than other foods, even though they might have the same number of calories or carbohydrates. It is difficult to get your body what it needs when you eat foods with fewer nutrients. Examples of foods that you should avoid that are high in calories and carbohydrates but low in nutrients include:  Trans fats (most processed foods list trans fats on the Nutrition Facts label).  Regular soda.  Juice.  Candy.  Sweets, such as cake, pie, doughnuts, and cookies.  Fried foods. What foods can I eat? Eat nutrient-rich foods, which will nourish your body and keep you healthy. The food you should eat also will depend on several factors, including:  The calories you need.  The medicines you take.  Your weight.  Your blood glucose level.  Your blood pressure level.  Your cholesterol level. You should eat a variety of foods, including:  Protein.  Lean cuts of meat.  Proteins low in saturated fats, such as fish, egg whites, and beans. Avoid processed meats.  Fruits and vegetables.  Fruits and vegetables that may help control blood glucose levels, such as apples, mangoes, and yams.  Dairy products.  Choose fat-free or low-fat dairy products, such as milk, yogurt, and cheese.  Grains, bread, pasta, and rice.  Choose whole grain  products, such as multigrain bread, whole oats, and brown rice. These foods may help control blood pressure.  Fats.  Foods containing healthful fats, such as nuts, avocado, olive oil, canola oil, and fish. Does everyone with diabetes mellitus have the same meal plan? Because every person with diabetes mellitus is different, there is not one meal plan that works for everyone. It is very important that you meet with a dietitian who will help you create a meal plan that is just right for you. This information is not intended to replace advice given to you by your health care provider. Make sure you discuss any questions you have with your health care provider. Document Released: 05/03/2005 Document Revised: 01/12/2016 Document Reviewed: 07/03/2013 Elsevier Interactive Patient Education  2017 [employer].

## 2017-01-02 LAB — CBC WITH DIFFERENTIAL/PLATELET
BASOS: 0 %
Basophils Absolute: 0 10*3/uL (ref 0.0–0.2)
EOS (ABSOLUTE): 0.3 10*3/uL (ref 0.0–0.4)
EOS: 3 %
HEMATOCRIT: 41.9 % (ref 34.0–46.6)
Hemoglobin: 13.6 g/dL (ref 11.1–15.9)
IMMATURE GRANULOCYTES: 0 %
Immature Grans (Abs): 0 10*3/uL (ref 0.0–0.1)
LYMPHS ABS: 3.2 10*3/uL — AB (ref 0.7–3.1)
Lymphs: 36 %
MCH: 29.6 pg (ref 26.6–33.0)
MCHC: 32.5 g/dL (ref 31.5–35.7)
MCV: 91 fL (ref 79–97)
Monocytes Absolute: 0.4 10*3/uL (ref 0.1–0.9)
Monocytes: 4 %
NEUTROS ABS: 4.8 10*3/uL (ref 1.4–7.0)
NEUTROS PCT: 57 %
Platelets: 282 10*3/uL (ref 150–379)
RBC: 4.59 x10E6/uL (ref 3.77–5.28)
RDW: 14.3 % (ref 12.3–15.4)
WBC: 8.7 10*3/uL (ref 3.4–10.8)

## 2017-01-02 LAB — CMP AND LIVER
ALT: 13 IU/L (ref 0–32)
AST: 15 IU/L (ref 0–40)
Albumin: 4.3 g/dL (ref 3.5–4.8)
Alkaline Phosphatase: 96 IU/L (ref 39–117)
BILIRUBIN, DIRECT: 0.08 mg/dL (ref 0.00–0.40)
BUN: 18 mg/dL (ref 8–27)
Bilirubin Total: 0.2 mg/dL (ref 0.0–1.2)
CO2: 26 mmol/L (ref 18–29)
CREATININE: 1.2 mg/dL — AB (ref 0.57–1.00)
Calcium: 9.3 mg/dL (ref 8.7–10.3)
Chloride: 100 mmol/L (ref 96–106)
GFR calc Af Amer: 51 mL/min/{1.73_m2} — ABNORMAL LOW (ref >59)
GFR calc non Af Amer: 45 mL/min/{1.73_m2} — ABNORMAL LOW (ref >59)
Glucose: 107 mg/dL — ABNORMAL HIGH (ref 65–99)
Potassium: 4.5 mmol/L (ref 3.5–5.2)
Sodium: 142 mmol/L (ref 134–144)
TOTAL PROTEIN: 6.7 g/dL (ref 6.0–8.5)

## 2017-01-02 LAB — LIPID PANEL
Chol/HDL Ratio: 4.4 ratio (ref 0.0–4.4)
Cholesterol, Total: 213 mg/dL — ABNORMAL HIGH (ref 100–199)
HDL: 48 mg/dL (ref >39)
LDL CALC: 91 mg/dL (ref 0–99)
TRIGLYCERIDES: 368 mg/dL — AB (ref 0–149)
VLDL CHOLESTEROL CAL: 74 mg/dL — AB (ref 5–40)

## 2017-01-02 LAB — VITAMIN D 25 HYDROXY (VIT D DEFICIENCY, FRACTURES): Vit D, 25-Hydroxy: 15.2 ng/mL — ABNORMAL LOW (ref 30.0–100.0)

## 2017-01-02 LAB — URINE DRUGS OF ABUSE SCREEN W ALC, ROUTINE (REF LAB)
AMPHETAMINES, URINE: NEGATIVE ng/mL
BARBITURATE QUANT UR: NEGATIVE ng/mL
BENZODIAZEPINE QUANT UR: NEGATIVE ng/mL
CANNABINOID QUANT UR: NEGATIVE ng/mL
Cocaine (Metab.): NEGATIVE ng/mL
ETHANOL U, QUAN: NEGATIVE %
Methadone Screen, Urine: NEGATIVE ng/mL
Opiate Quant, Ur: NEGATIVE ng/mL
PCP QUANT UR: NEGATIVE ng/mL
Propoxyphene: NEGATIVE ng/mL

## 2017-01-03 ENCOUNTER — Other Ambulatory Visit: Payer: Self-pay | Admitting: Pharmacist

## 2017-01-03 ENCOUNTER — Telehealth: Payer: Self-pay | Admitting: Licensed Clinical Social Worker

## 2017-01-03 ENCOUNTER — Encounter: Payer: Self-pay | Admitting: Internal Medicine

## 2017-01-03 DIAGNOSIS — E1142 Type 2 diabetes mellitus with diabetic polyneuropathy: Secondary | ICD-10-CM

## 2017-01-03 MED ORDER — GABAPENTIN 300 MG PO CAPS: 300.0000 mg | ORAL_CAPSULE | Freq: Three times a day (TID) | ORAL | 3 refills | Status: DC

## 2017-01-03 MED ORDER — PRAVASTATIN SODIUM 20 MG PO TABS: 20.0000 mg | ORAL_TABLET | Freq: Every day | ORAL | 3 refills | Status: DC

## 2017-01-03 MED ORDER — LISINOPRIL 5 MG PO TABS: 5.0000 mg | ORAL_TABLET | Freq: Every day | ORAL | 3 refills | Status: DC

## 2017-01-03 MED ORDER — METFORMIN HCL ER 500 MG PO TB24: 500.0000 mg | ORAL_TABLET | Freq: Every day | ORAL | 3 refills | Status: DC

## 2017-01-03 MED ORDER — OXYBUTYNIN CHLORIDE ER 5 MG PO TB24: 5.0000 mg | ORAL_TABLET | Freq: Every day | ORAL | 3 refills | Status: DC

## 2017-01-03 NOTE — Telephone Encounter (Signed)
LCSWA attempted to contact pt to follow up on behavioral health screens from prior appointment. LCSWA left message for a return call.  

## 2017-01-03 NOTE — Telephone Encounter (Signed)
Received call from patient, she would like all of her medications to be sent to [facility] on [address]. The pharmacy was unable to contact [facility]. Will forward all prescriptions to [facility].

## 2017-01-04 ENCOUNTER — Encounter: Payer: Self-pay | Admitting: Internal Medicine

## 2017-01-07 ENCOUNTER — Telehealth: Payer: Self-pay | Admitting: Licensed Clinical Social Worker

## 2017-01-07 ENCOUNTER — Encounter: Payer: Self-pay | Admitting: Internal Medicine

## 2017-01-07 ENCOUNTER — Other Ambulatory Visit: Payer: Self-pay | Admitting: Internal Medicine

## 2017-01-07 MED ORDER — VITAMIN D (ERGOCALCIFEROL) 1.25 MG (50000 UNIT) PO CAPS: 50000.0000 [IU] | ORAL_CAPSULE | ORAL | 0 refills | Status: DC

## 2017-01-07 MED FILL — VIT D2 1.25 MG (50,000 UNIT: 1.25 MG | 28 days supply | Qty: 4 | Fill #0

## 2017-01-07 NOTE — Telephone Encounter (Signed)
LCSWA contacted pt via telephone to follow up on behavioral health screens from prior PCP appointment and to offer supportive resources.   Pt shared that she currently resides in a home on her brother's property that has heat and electricity. At this time, there is no running water; therefore, pt utilizes shower facilities at a [employer]. She has a working Market researcher. Pt states that she is provided meals in the community through local churches and organizations.   Pt disclosed the recent passing of her best friend's son. LCSWA educated pt on the stages of grief and encouraged pt to participate in grief support services through [facility]. Pt shared that she receives strong support from her family and friends.     Pt inquired about the cost of her medications through [place]. LCSWA consulted with Pharmacy. Pt was informed of the cost of her prescriptions and that they are currently ready for pick up.   Pt denied needing any additional resources and thanked LCSWA for following up. Pt was encouraged to contact LCSWA if symptoms worsen or fail to improve to schedule behavioral appointments at [facility].

## 2017-01-08 ENCOUNTER — Telehealth: Payer: Self-pay

## 2017-01-08 NOTE — Telephone Encounter (Signed)
Contacted pt to go over lab results pt didn't answer and was unable to lvm  

## 2017-01-15 ENCOUNTER — Ambulatory Visit: Payer: Self-pay | Admitting: Internal Medicine

## 2017-01-25 ENCOUNTER — Telehealth: Payer: Self-pay | Admitting: Internal Medicine

## 2017-01-25 NOTE — Telephone Encounter (Signed)
Pt states that she is moving and has not began taking her htn meds, states that she went to free med giveaway and her bp was in the 120's, states that she is unsure as to whether she still needs to start taking meds. Would like a call back to advise, states that is so, she will begin taking meds this Saturday (01/26/17). Please f/u.

## 2017-01-25 NOTE — Telephone Encounter (Signed)
Patient was supposed to follow up with Carilyn Goodpasture for BP check per Dr. Jerrye Bushy last note. She needs to be scheduled for this visit.

## 2017-01-29 ENCOUNTER — Ambulatory Visit: Payer: Self-pay | Admitting: Internal Medicine

## 2017-01-29 NOTE — Telephone Encounter (Signed)
Pt states she was told to take medication for 2 wks but just started medication on Monday June 11.  Pt is taking BP at home. Range SBP reading has been ~129 and DBP range between 70- 80.  She has an appointment on June 28 to establish care with Dr. Wynetta Emery and would have taken medication for 2 weeks.

## 2017-02-11 ENCOUNTER — Telehealth: Payer: Self-pay | Admitting: Internal Medicine

## 2017-02-11 NOTE — Telephone Encounter (Signed)
Patient is requesting refill for tramadol, she no longer has anymore pills. She would like to get this refilled urgently possible.  Please advised

## 2017-02-12 NOTE — Telephone Encounter (Signed)
PT called back to please auth  At least 9 pills until Thursday that she has an appt with her PCP since she is in too much pain and can function like this, please can you please auth the 9 pills and follow up with PT or please call her back to see what she can do

## 2017-02-12 NOTE — Telephone Encounter (Signed)
Returned pt call and spoke with pt about the tramadol and informed pt that Dr. Wynetta Emery would not refill her medication because she has not seen her. Pt states she only needs 9 pills till her appointment. I informed pt that would I have to ask Dr. Wynetta Emery pt states what is she suppose to do is she suppose to go the ED or urgent care. Per Dr. Wynetta Emery pt has an appointment with her on the 6/28/2018and pt will need to keep appointment

## 2017-02-14 ENCOUNTER — Encounter: Payer: Self-pay | Admitting: Internal Medicine

## 2017-02-14 ENCOUNTER — Other Ambulatory Visit: Payer: Self-pay | Admitting: Pharmacist

## 2017-02-14 ENCOUNTER — Ambulatory Visit: Payer: Commercial Managed Care - HMO | Attending: Internal Medicine | Admitting: Internal Medicine

## 2017-02-14 VITALS — BP 155/73 | HR 89 | Temp 97.7°F | Resp 16 | Wt 185.6 lb

## 2017-02-14 DIAGNOSIS — M797 Fibromyalgia: Secondary | ICD-10-CM

## 2017-02-14 DIAGNOSIS — G8929 Other chronic pain: Secondary | ICD-10-CM

## 2017-02-14 DIAGNOSIS — M25551 Pain in right hip: Secondary | ICD-10-CM

## 2017-02-14 DIAGNOSIS — W57XXXA Bitten or stung by nonvenomous insect and other nonvenomous arthropods, initial encounter: Secondary | ICD-10-CM | POA: Insufficient documentation

## 2017-02-14 DIAGNOSIS — Z5900 Homelessness unspecified: Secondary | ICD-10-CM

## 2017-02-14 DIAGNOSIS — Z880 Allergy status to penicillin: Secondary | ICD-10-CM | POA: Insufficient documentation

## 2017-02-14 DIAGNOSIS — E1149 Type 2 diabetes mellitus with other diabetic neurological complication: Secondary | ICD-10-CM

## 2017-02-14 DIAGNOSIS — Z79899 Other long term (current) drug therapy: Secondary | ICD-10-CM | POA: Insufficient documentation

## 2017-02-14 DIAGNOSIS — M170 Bilateral primary osteoarthritis of knee: Secondary | ICD-10-CM | POA: Insufficient documentation

## 2017-02-14 DIAGNOSIS — M25541 Pain in joints of right hand: Secondary | ICD-10-CM | POA: Insufficient documentation

## 2017-02-14 DIAGNOSIS — M25561 Pain in right knee: Secondary | ICD-10-CM | POA: Insufficient documentation

## 2017-02-14 DIAGNOSIS — M2550 Pain in unspecified joint: Secondary | ICD-10-CM | POA: Diagnosis not present

## 2017-02-14 DIAGNOSIS — I252 Old myocardial infarction: Secondary | ICD-10-CM | POA: Diagnosis not present

## 2017-02-14 DIAGNOSIS — W57XXXD Bitten or stung by nonvenomous insect and other nonvenomous arthropods, subsequent encounter: Secondary | ICD-10-CM

## 2017-02-14 DIAGNOSIS — M25562 Pain in left knee: Secondary | ICD-10-CM | POA: Diagnosis not present

## 2017-02-14 DIAGNOSIS — I129 Hypertensive chronic kidney disease with stage 1 through stage 4 chronic kidney disease, or unspecified chronic kidney disease: Secondary | ICD-10-CM | POA: Diagnosis not present

## 2017-02-14 DIAGNOSIS — M25511 Pain in right shoulder: Secondary | ICD-10-CM | POA: Diagnosis not present

## 2017-02-14 DIAGNOSIS — N182 Chronic kidney disease, stage 2 (mild): Secondary | ICD-10-CM | POA: Diagnosis not present

## 2017-02-14 DIAGNOSIS — Z7984 Long term (current) use of oral hypoglycemic drugs: Secondary | ICD-10-CM | POA: Insufficient documentation

## 2017-02-14 DIAGNOSIS — E1122 Type 2 diabetes mellitus with diabetic chronic kidney disease: Secondary | ICD-10-CM | POA: Insufficient documentation

## 2017-02-14 DIAGNOSIS — H547 Unspecified visual loss: Secondary | ICD-10-CM

## 2017-02-14 LAB — GLUCOSE, POCT (MANUAL RESULT ENTRY): POC Glucose: 136 mg/dl — AB (ref 70–99)

## 2017-02-14 MED ORDER — TRIAMCINOLONE ACETONIDE 0.1 % EX CREA: 1.0000 "application " | TOPICAL_CREAM | Freq: Two times a day (BID) | CUTANEOUS | 0 refills | Status: DC

## 2017-02-14 MED ORDER — TRAMADOL HCL 50 MG PO TABS: 50.0000 mg | ORAL_TABLET | Freq: Three times a day (TID) | ORAL | 2 refills | Status: DC | PRN

## 2017-02-14 MED FILL — traMADol HCL 50 MG TABS: 50 | 30 days supply | Qty: 90 | Fill #0

## 2017-02-14 NOTE — Patient Instructions (Addendum)
Use the Triamcinolone cream to help decrease the itching. Wear long sleeve shirts and long pants when out in the woods.   Keep the appointment with the eye doctor tomorrow.  Try to stay active by getting in walks for 15-20 minutes daily.
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
## 2017-02-19 IMAGING — CR DG HUMERUS 2V *R*
2 series · 2 of 2 positions shown · non-contrast
Comparison: None in PACs

CLINICAL DATA: Right arm pain status post fall

EXAM:
RIGHT HUMERUS - 2+ VIEW

[AP]
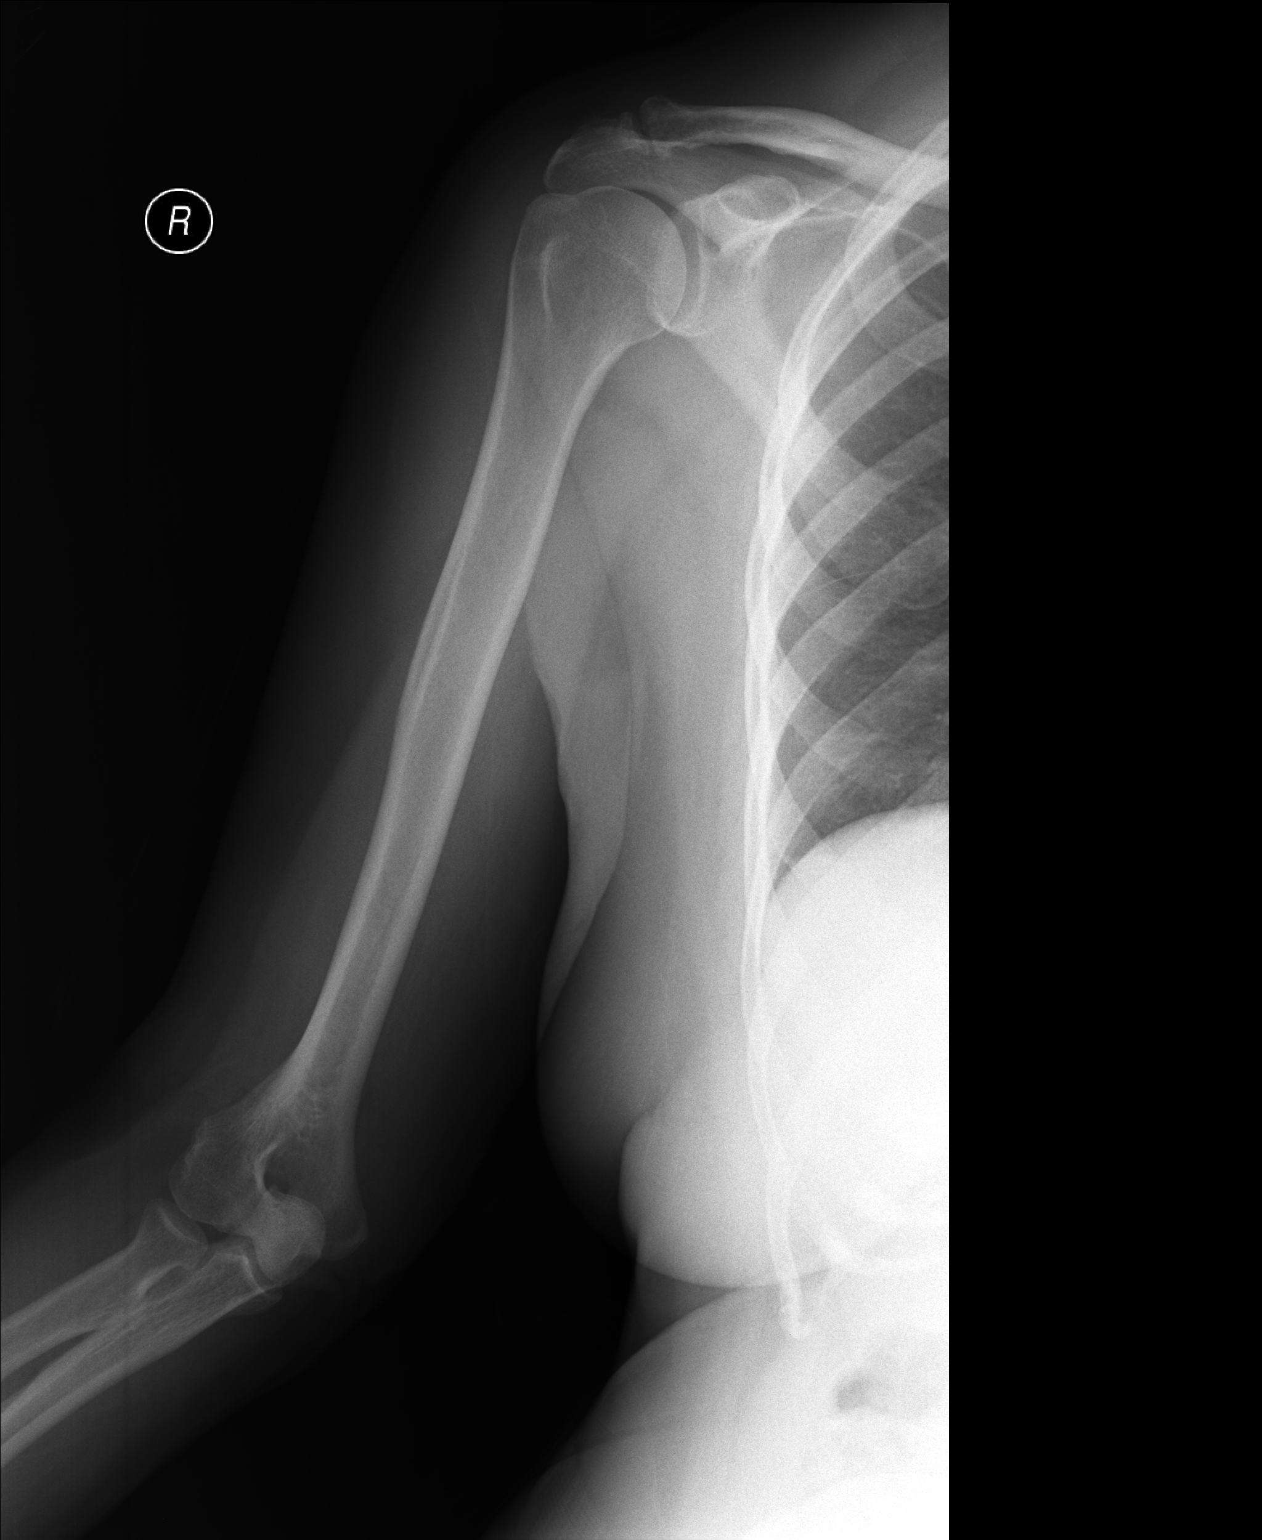

[lateral]
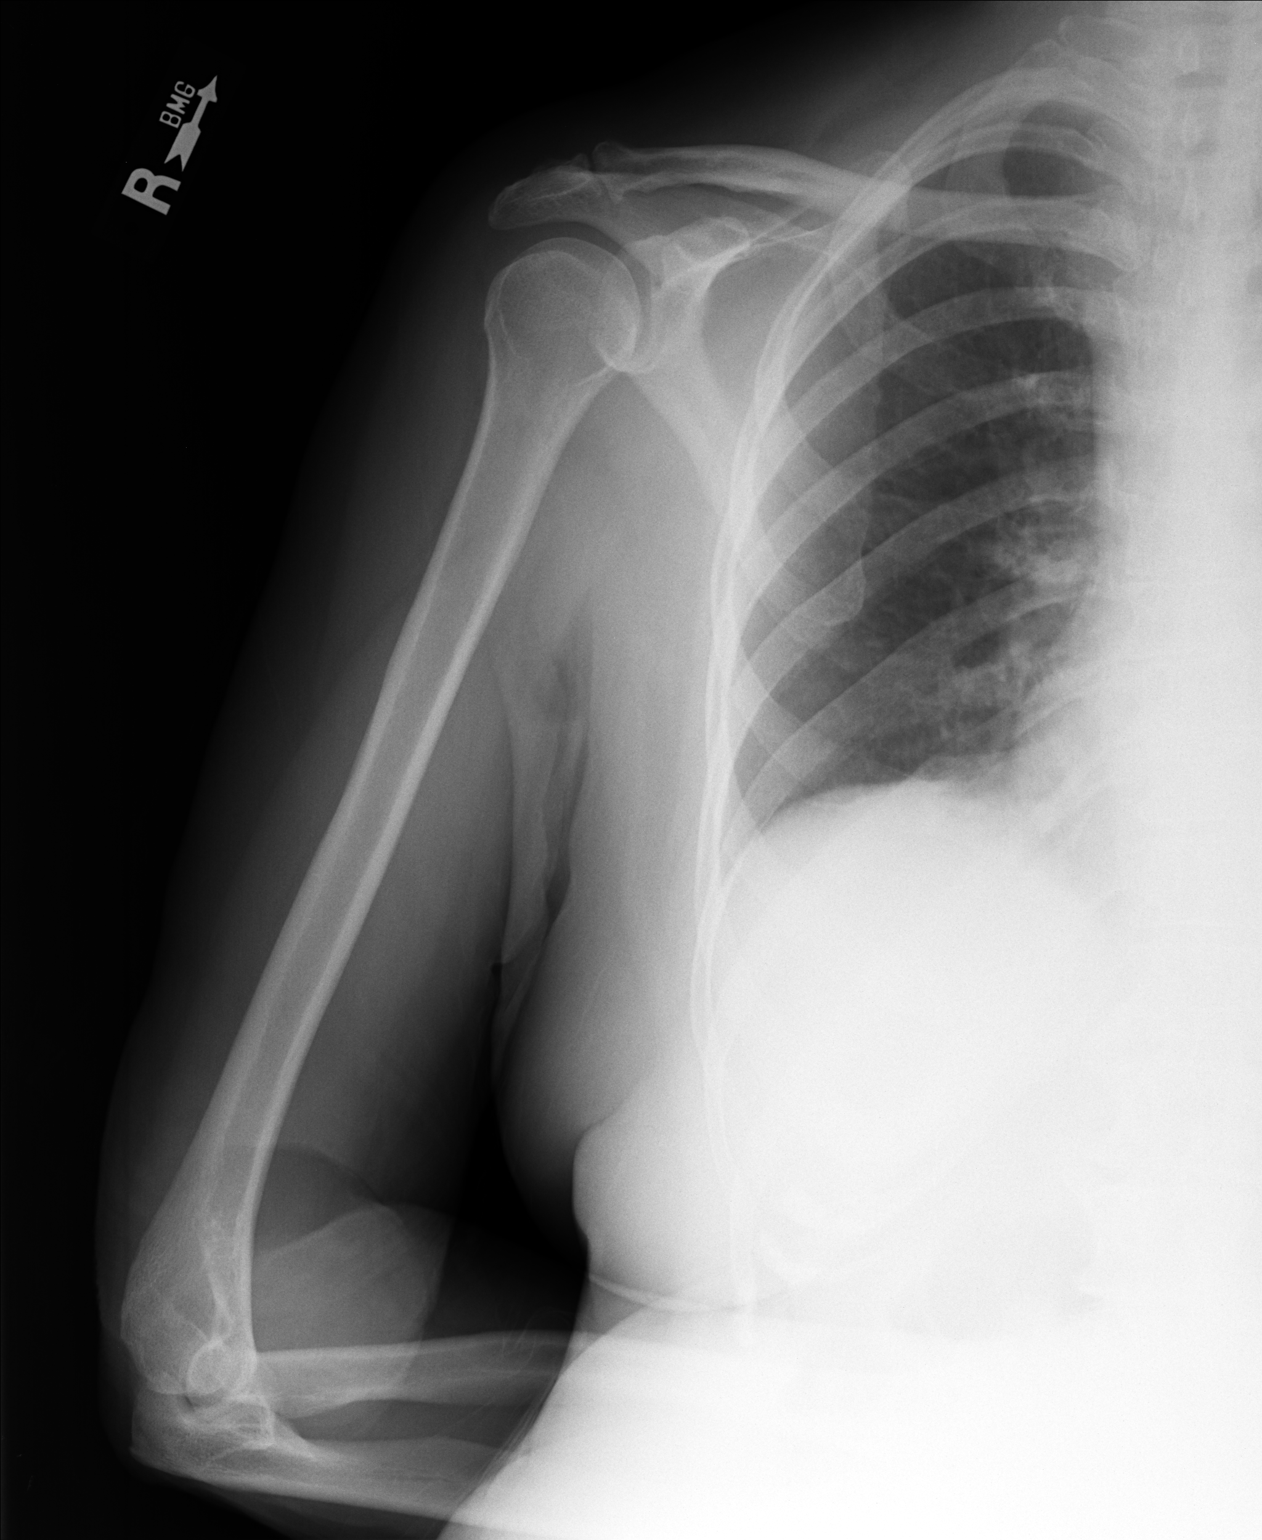

[2 of 2 positions shown; findings below may reference images not displayed]

FINDINGS: The humerus is adequately mineralized. There is no acute or healing
fracture. There is no lytic or blastic lesion. The observed portions
of the right shoulder and right elbow exhibit no acute
abnormalities. There is degenerative change of the right AC joint.
The soft tissues of the arm are unremarkable.
IMPRESSION: There is no acute or chronic bony abnormality of the right humerus.

## 2017-02-28 MED FILL — GABAPENTIN 300 MG CAPSULE: 300 | 30 days supply | Qty: 90 | Fill #1

## 2017-02-28 MED FILL — OXYBUTYNIN CL ER 5 MG TAB: 5 | 30 days supply | Qty: 30 | Fill #1

## 2017-02-28 MED FILL — LISINOPRIL 5 MG TAB: 5 | 30 days supply | Qty: 30 | Fill #1

## 2017-02-28 MED FILL — VIT D2 1.25 MG (50,000 UNIT: 1.25 MG | 28 days supply | Qty: 4 | Fill #1

## 2017-03-05 ENCOUNTER — Encounter: Payer: Self-pay | Admitting: Internal Medicine

## 2017-03-05 DIAGNOSIS — E119 Type 2 diabetes mellitus without complications: Secondary | ICD-10-CM | POA: Diagnosis not present

## 2017-03-05 DIAGNOSIS — H43813 Vitreous degeneration, bilateral: Secondary | ICD-10-CM | POA: Diagnosis not present

## 2017-03-05 DIAGNOSIS — H2513 Age-related nuclear cataract, bilateral: Secondary | ICD-10-CM | POA: Diagnosis not present

## 2017-03-05 DIAGNOSIS — H524 Presbyopia: Secondary | ICD-10-CM | POA: Diagnosis not present

## 2017-03-05 LAB — HM DIABETES EYE EXAM

## 2017-03-20 MED FILL — traMADol HCL 50 MG TABS: 50 | 30 days supply | Qty: 90 | Fill #1

## 2017-03-25 MED FILL — GABAPENTIN 300 MG CAPSULE: 300 | 30 days supply | Qty: 90 | Fill #2

## 2017-03-25 MED FILL — LISINOPRIL 5 MG TAB: 5 | 30 days supply | Qty: 30 | Fill #2

## 2017-03-29 ENCOUNTER — Encounter: Payer: Self-pay | Admitting: Internal Medicine

## 2017-03-29 ENCOUNTER — Ambulatory Visit: Payer: Commercial Managed Care - HMO | Attending: Internal Medicine | Admitting: Internal Medicine

## 2017-03-29 VITALS — BP 145/73 | HR 72 | Temp 97.5°F | Resp 16 | Wt 182.0 lb

## 2017-03-29 DIAGNOSIS — M19041 Primary osteoarthritis, right hand: Secondary | ICD-10-CM | POA: Insufficient documentation

## 2017-03-29 DIAGNOSIS — R350 Frequency of micturition: Secondary | ICD-10-CM | POA: Insufficient documentation

## 2017-03-29 DIAGNOSIS — T679XXA Effect of heat and light, unspecified, initial encounter: Secondary | ICD-10-CM | POA: Insufficient documentation

## 2017-03-29 DIAGNOSIS — B349 Viral infection, unspecified: Secondary | ICD-10-CM

## 2017-03-29 DIAGNOSIS — R413 Other amnesia: Secondary | ICD-10-CM | POA: Insufficient documentation

## 2017-03-29 DIAGNOSIS — E1122 Type 2 diabetes mellitus with diabetic chronic kidney disease: Secondary | ICD-10-CM | POA: Insufficient documentation

## 2017-03-29 DIAGNOSIS — Z7984 Long term (current) use of oral hypoglycemic drugs: Secondary | ICD-10-CM | POA: Diagnosis not present

## 2017-03-29 DIAGNOSIS — E1140 Type 2 diabetes mellitus with diabetic neuropathy, unspecified: Secondary | ICD-10-CM | POA: Insufficient documentation

## 2017-03-29 DIAGNOSIS — N182 Chronic kidney disease, stage 2 (mild): Secondary | ICD-10-CM | POA: Diagnosis not present

## 2017-03-29 DIAGNOSIS — R51 Headache: Secondary | ICD-10-CM | POA: Insufficient documentation

## 2017-03-29 DIAGNOSIS — I129 Hypertensive chronic kidney disease with stage 1 through stage 4 chronic kidney disease, or unspecified chronic kidney disease: Secondary | ICD-10-CM | POA: Diagnosis not present

## 2017-03-29 DIAGNOSIS — Z9114 Patient's other noncompliance with medication regimen: Secondary | ICD-10-CM | POA: Insufficient documentation

## 2017-03-29 DIAGNOSIS — M19042 Primary osteoarthritis, left hand: Secondary | ICD-10-CM | POA: Insufficient documentation

## 2017-03-29 DIAGNOSIS — E1142 Type 2 diabetes mellitus with diabetic polyneuropathy: Secondary | ICD-10-CM | POA: Diagnosis not present

## 2017-03-29 DIAGNOSIS — T679XXS Effect of heat and light, unspecified, sequela: Secondary | ICD-10-CM | POA: Diagnosis not present

## 2017-03-29 DIAGNOSIS — N3000 Acute cystitis without hematuria: Secondary | ICD-10-CM | POA: Insufficient documentation

## 2017-03-29 DIAGNOSIS — R509 Fever, unspecified: Secondary | ICD-10-CM | POA: Diagnosis not present

## 2017-03-29 DIAGNOSIS — M545 Low back pain: Secondary | ICD-10-CM | POA: Insufficient documentation

## 2017-03-29 DIAGNOSIS — G8929 Other chronic pain: Secondary | ICD-10-CM | POA: Insufficient documentation

## 2017-03-29 DIAGNOSIS — Z590 Homelessness: Secondary | ICD-10-CM | POA: Insufficient documentation

## 2017-03-29 LAB — POCT URINALYSIS DIPSTICK
Bilirubin, UA: NEGATIVE
Blood, UA: NEGATIVE
GLUCOSE UA: NEGATIVE
Ketones, UA: NEGATIVE
Nitrite, UA: NEGATIVE
Protein, UA: NEGATIVE
Spec Grav, UA: 1.015 (ref 1.010–1.025)
UROBILINOGEN UA: 0.2 U/dL
pH, UA: 5 (ref 5.0–8.0)

## 2017-03-29 LAB — GLUCOSE, POCT (MANUAL RESULT ENTRY): POC Glucose: 151 mg/dl — AB (ref 70–99)

## 2017-03-29 MED ORDER — SULFAMETHOXAZOLE-TRIMETHOPRIM 400-80 MG PO TABS: 1.0000 | ORAL_TABLET | Freq: Two times a day (BID) | ORAL | 0 refills | Status: DC

## 2017-03-29 MED FILL — SULFAMETHOXAZOLE-TMP SS TAB: 400-80 | 5 days supply | Qty: 10 | Fill #0
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
## 2017-03-29 NOTE — Patient Instructions (Signed)
Drink fluids to keep yourself hydrated. Avoid working in the heat for prolong periods.

## 2017-04-18 MED FILL — traMADol HCL 50 MG TABS: 50 | 30 days supply | Qty: 90 | Fill #2

## 2017-04-19 ENCOUNTER — Telehealth: Payer: Self-pay | Admitting: Internal Medicine

## 2017-04-19 DIAGNOSIS — E1142 Type 2 diabetes mellitus with diabetic polyneuropathy: Secondary | ICD-10-CM

## 2017-04-19 MED ORDER — PRAVASTATIN SODIUM 20 MG PO TABS: 20.0000 mg | ORAL_TABLET | Freq: Every day | ORAL | 0 refills | Status: DC

## 2017-04-19 MED ORDER — METFORMIN HCL ER 500 MG PO TB24: 500.0000 mg | ORAL_TABLET | Freq: Every day | ORAL | 0 refills | Status: DC

## 2017-04-19 MED ORDER — LISINOPRIL 5 MG PO TABS: 5.0000 mg | ORAL_TABLET | Freq: Every day | ORAL | 0 refills | Status: DC

## 2017-04-19 MED ORDER — OXYBUTYNIN CHLORIDE ER 5 MG PO TB24
5.0000 mg | ORAL_TABLET | Freq: Every day | ORAL | 0 refills | Status: DC
Start: 2017-04-19 — End: 2017-07-16

## 2017-04-19 MED ORDER — GABAPENTIN 300 MG PO CAPS: 300.0000 mg | ORAL_CAPSULE | Freq: Three times a day (TID) | ORAL | 2 refills | Status: DC

## 2017-04-19 NOTE — Telephone Encounter (Signed)
Chronic medications refilled.

## 2017-04-19 NOTE — Telephone Encounter (Signed)
Pt called requesting medication refill on oxybutynin (DITROPAN XL) 5 MG 24 hr tablet, and all current medications. Please f/up

## 2017-04-19 NOTE — Telephone Encounter (Signed)
Pt called requesting medication refill on traMADol (ULTRAM) 50 MG tablet, please f/up

## 2017-04-26 MED FILL — OXYBUTYNIN CL ER 5 MG TAB: 5 | 30 days supply | Qty: 30 | Fill #2

## 2017-04-26 MED FILL — LISINOPRIL 5 MG TAB: 5 | 30 days supply | Qty: 30 | Fill #3

## 2017-05-14 ENCOUNTER — Encounter: Payer: Self-pay | Admitting: Internal Medicine

## 2017-05-14 ENCOUNTER — Telehealth: Payer: Self-pay | Admitting: Internal Medicine

## 2017-05-14 ENCOUNTER — Ambulatory Visit (HOSPITAL_COMMUNITY): Payer: Medicare HMO

## 2017-05-14 ENCOUNTER — Ambulatory Visit: Payer: Medicare HMO | Attending: Internal Medicine | Admitting: Internal Medicine

## 2017-05-14 VITALS — BP 135/69 | HR 88 | Temp 98.1°F | Resp 16 | Wt 170.8 lb

## 2017-05-14 DIAGNOSIS — M2550 Pain in unspecified joint: Secondary | ICD-10-CM

## 2017-05-14 DIAGNOSIS — E1169 Type 2 diabetes mellitus with other specified complication: Secondary | ICD-10-CM | POA: Insufficient documentation

## 2017-05-14 DIAGNOSIS — G44309 Post-traumatic headache, unspecified, not intractable: Secondary | ICD-10-CM | POA: Diagnosis not present

## 2017-05-14 DIAGNOSIS — Z79899 Other long term (current) drug therapy: Secondary | ICD-10-CM | POA: Diagnosis not present

## 2017-05-14 DIAGNOSIS — Z7984 Long term (current) use of oral hypoglycemic drugs: Secondary | ICD-10-CM | POA: Insufficient documentation

## 2017-05-14 DIAGNOSIS — Z2821 Immunization not carried out because of patient refusal: Secondary | ICD-10-CM | POA: Diagnosis not present

## 2017-05-14 DIAGNOSIS — R296 Repeated falls: Secondary | ICD-10-CM

## 2017-05-14 DIAGNOSIS — E1149 Type 2 diabetes mellitus with other diabetic neurological complication: Secondary | ICD-10-CM | POA: Diagnosis not present

## 2017-05-14 LAB — GLUCOSE, POCT (MANUAL RESULT ENTRY): POC Glucose: 154 mg/dl — AB (ref 70–99)

## 2017-05-14 MED ORDER — TRAMADOL HCL 50 MG PO TABS: 50.0000 mg | ORAL_TABLET | Freq: Three times a day (TID) | ORAL | 2 refills | Status: DC | PRN

## 2017-05-14 MED FILL — traMADol HCL 50 MG TABS: 50 | 30 days supply | Qty: 90 | Fill #0
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
## 2017-05-14 NOTE — Patient Instructions (Signed)
Try to drink fluids to keep self hydrated.

## 2017-05-14 NOTE — Telephone Encounter (Signed)
Donna Nguyen contacted the office asking to speak with PCP in regards to the orders placed for patient. Based on patient's insurance order needs to be changed to without contrast. Please follow up.    Donna Nguyen [phone] ext. [phone]  Thak you.

## 2017-05-15 ENCOUNTER — Ambulatory Visit (HOSPITAL_COMMUNITY)
Admission: RE | Admit: 2017-05-15 | Discharge: 2017-05-15 | Disposition: A | Discharge status: Home / Self Care | Payer: Medicare HMO | Source: Ambulatory Visit | Attending: Internal Medicine | Admitting: Internal Medicine

## 2017-05-15 DIAGNOSIS — I6782 Cerebral ischemia: Secondary | ICD-10-CM | POA: Insufficient documentation

## 2017-05-15 DIAGNOSIS — G44309 Post-traumatic headache, unspecified, not intractable: Secondary | ICD-10-CM | POA: Diagnosis not present

## 2017-05-15 DIAGNOSIS — R51 Headache: Secondary | ICD-10-CM | POA: Diagnosis not present

## 2017-05-15 NOTE — Telephone Encounter (Signed)
I tried contacting pt this a.m. No answer. She has a voice mail box that has not been set up yet.

## 2017-05-15 NOTE — Telephone Encounter (Signed)
Tried contacting patient first before rescheduling pt didn't answer

## 2017-05-17 ENCOUNTER — Telehealth: Payer: Self-pay

## 2017-05-17 NOTE — Telephone Encounter (Signed)
Contacted pt to go over CT results pt didn't answer and was unable to lvm  If pt calls back or come by office please give results: CT scan of head was negative for any bleed. Would like to see her back in 1-2 wks

## 2017-05-20 ENCOUNTER — Ambulatory Visit (HOSPITAL_COMMUNITY): Payer: Medicare HMO

## 2017-05-31 ENCOUNTER — Ambulatory Visit: Payer: Medicare HMO | Attending: Internal Medicine | Admitting: Internal Medicine

## 2017-05-31 ENCOUNTER — Encounter: Payer: Self-pay | Admitting: Internal Medicine

## 2017-05-31 VITALS — BP 115/68 | HR 100 | Temp 98.0°F | Resp 16 | Wt 172.6 lb

## 2017-05-31 DIAGNOSIS — Z79899 Other long term (current) drug therapy: Secondary | ICD-10-CM | POA: Insufficient documentation

## 2017-05-31 DIAGNOSIS — I129 Hypertensive chronic kidney disease with stage 1 through stage 4 chronic kidney disease, or unspecified chronic kidney disease: Secondary | ICD-10-CM | POA: Diagnosis not present

## 2017-05-31 DIAGNOSIS — M19041 Primary osteoarthritis, right hand: Secondary | ICD-10-CM | POA: Diagnosis not present

## 2017-05-31 DIAGNOSIS — R269 Unspecified abnormalities of gait and mobility: Secondary | ICD-10-CM | POA: Insufficient documentation

## 2017-05-31 DIAGNOSIS — M545 Low back pain, unspecified: Secondary | ICD-10-CM

## 2017-05-31 DIAGNOSIS — E1149 Type 2 diabetes mellitus with other diabetic neurological complication: Secondary | ICD-10-CM | POA: Diagnosis not present

## 2017-05-31 DIAGNOSIS — I10 Essential (primary) hypertension: Secondary | ICD-10-CM | POA: Diagnosis not present

## 2017-05-31 DIAGNOSIS — Z2821 Immunization not carried out because of patient refusal: Secondary | ICD-10-CM | POA: Diagnosis not present

## 2017-05-31 DIAGNOSIS — M19042 Primary osteoarthritis, left hand: Secondary | ICD-10-CM | POA: Diagnosis not present

## 2017-05-31 DIAGNOSIS — M170 Bilateral primary osteoarthritis of knee: Secondary | ICD-10-CM | POA: Insufficient documentation

## 2017-05-31 DIAGNOSIS — G8929 Other chronic pain: Secondary | ICD-10-CM | POA: Diagnosis not present

## 2017-05-31 DIAGNOSIS — E1122 Type 2 diabetes mellitus with diabetic chronic kidney disease: Secondary | ICD-10-CM | POA: Diagnosis not present

## 2017-05-31 DIAGNOSIS — N182 Chronic kidney disease, stage 2 (mild): Secondary | ICD-10-CM | POA: Diagnosis not present

## 2017-05-31 DIAGNOSIS — Z590 Homelessness: Secondary | ICD-10-CM | POA: Diagnosis not present

## 2017-05-31 DIAGNOSIS — Z7984 Long term (current) use of oral hypoglycemic drugs: Secondary | ICD-10-CM | POA: Diagnosis not present

## 2017-05-31 DIAGNOSIS — R42 Dizziness and giddiness: Secondary | ICD-10-CM | POA: Diagnosis not present

## 2017-05-31 LAB — GLUCOSE, POCT (MANUAL RESULT ENTRY): POC GLUCOSE: 141 mg/dL — AB (ref 70–99)

## 2017-05-31 LAB — POCT GLYCOSYLATED HEMOGLOBIN (HGB A1C): HEMOGLOBIN A1C: 6

## 2017-05-31 MED FILL — OXYBUTYNIN CL ER 5 MG TAB: 5 | 30 days supply | Qty: 30 | Fill #3

## 2017-05-31 MED FILL — GABAPENTIN 300 MG CAPSULE: 300 | 30 days supply | Qty: 90 | Fill #3

## 2017-05-31 NOTE — Progress Notes (Addendum)
Patient ID: Donna Nguyen, female    DOB: 02/27/1941  MRN: [id number]  CC: Follow-up (CT)   Subjective: Donna Nguyen is a 76 y.o. female who presents for short term f/u post falls Her concerns today include:  Patient with history of HTN, diabetes with neuropathy, HL, homelessness and chronic lower back pain on tramadol and gabapentin, OA of RT hand and knees, possible fibromyalgia . Last pain management agreement updated 06/2016.   1. Patient seen about 3 weeks ago with recurrent falls and head trauma associated with headaches and dizziness. She was having to ambulate with a rolling walker. CT of the head was negative for any acute process.  -Since then patient reports that she is doing much better and has regained her strength back. She is no longer having to use Rollator walker but now using a cane  -Headache has resolved. Occasional dizziness sometimes when she turns her head -Weight is up 2 pounds. She has gone back to [employer] to eat only twice since last visit with me. For the past several weeks she has been eating pack of nabs for breakfast, can soup for lunch and peanuts for dinner   2. Had eye exam done a few mths ago and rxn glasses prescribe but can not afford. Feels vision worse after having dilation drops put in eyes. Can not afford to go back to see the eye doctor at this time. Living on limited budget.  "I have to decide about doing that or keeping gas in my vehicle."  3. HTN: taking Lisinopril. Has not check BP recently. Can check BP at [facility]  4. DM: compliant with Metformin -c/o Numbness, burning and aching in fingers. Grip not good. -Aching and stiffness in legs also. Finds Gabapentin and Tramadol helpful  Patient Active Problem List   Diagnosis Date Noted  . Noncompliance with medications 06/19/2015  . Type 2 diabetes mellitus with stage 2 chronic kidney disease ([place]) 06/19/2015  . Type 2 diabetes mellitus with neurological manifestations ([place]) 03/14/2015   . Memory loss 03/14/2015     Current Outpatient Prescriptions on File Prior to Visit  Medication Sig Dispense Refill  . gabapentin (NEURONTIN) 300 MG capsule Take 1 capsule (300 mg total) by mouth 3 (three) times daily. 90 capsule 2  . metFORMIN (GLUCOPHAGE XR) 500 MG 24 hr tablet Take 1 tablet (500 mg total) by mouth daily with breakfast. 90 tablet 0  . NYSTATIN, TOPICAL, POWD Apply topically.    Marland Kitchen oxybutynin (DITROPAN XL) 5 MG 24 hr tablet Take 1 tablet (5 mg total) by mouth at bedtime. 90 tablet 0  . pravastatin (PRAVACHOL) 20 MG tablet Take 1 tablet (20 mg total) by mouth daily. 90 tablet 0  . sulfamethoxazole-trimethoprim (BACTRIM) 400-80 MG tablet Take 1 tablet by mouth 2 (two) times daily. (Patient not taking: Reported on 05/31/2017) 10 tablet 0  . traMADol (ULTRAM) 50 MG tablet Take 1 tablet (50 mg total) by mouth every 8 (eight) hours as needed. for pain 90 tablet 2  . triamcinolone cream (KENALOG) 0.1 % Apply 1 application topically 2 (two) times daily. 30 g 0  . Vitamin D, Ergocalciferol, (DRISDOL) 50000 units CAPS capsule Take 1 capsule (50,000 Units total) by mouth every 7 (seven) days. (Patient not taking: Reported on 05/31/2017) 12 capsule 0   No current facility-administered medications on file prior to visit.     Allergies  Allergen Reactions  . Acetaminophen Shortness Of Breath  . Amitriptyline   . Ciprofloxacin   .  Epinephrine   . Penicillins   . Metformin And Related Palpitations    Social History   Social History  . Marital status: Divorced    Spouse name: N/A  . Number of children: N/A  . Years of education: N/A   Occupational History  . Not on file.   Social History Main Topics  . Smoking status: Never Smoker  . Smokeless tobacco: Never Used  . Alcohol use 1.2 - 1.8 oz/week    1 - 2 Glasses of wine, 1 Shots of liquor per week     Comment: socially  . Drug use: No  . Sexual activity: Yes    Birth control/ protection: Post-menopausal   Other  Topics Concern  . Not on file   Social History Narrative  . No narrative on file    No family history on file.  Past Surgical History:  Procedure Laterality Date  . bladdee    . BLADDER SURGERY    . MOLE REMOVAL      ROS: Review of Systems Neg except as above PHYSICAL EXAM: BP 115/68   Pulse 100   Temp 98 F (36.7 C) (Oral)   Resp 16   Wt 172 lb 9.6 oz (78.3 kg)   SpO2 95%   BMI 29.17 kg/m   Wt Readings from Last 3 Encounters:  05/31/17 172 lb 9.6 oz (78.3 kg)  05/14/17 170 lb 12.8 oz (77.5 kg)  03/29/17 182 lb (82.6 kg)    Physical Exam General appearance - alert, well appearing, pleasant elderly female and in no distress.  Mental status - pt oriented x 3. Answers questions appropriately and follow commands Mouth - oral mucosa is moist Chest - clear to auscultation, no wheezes, rales or rhonchi, symmetric air entry Heart - normal rate, regular rhythm, normal S1, S2, no murmurs, rubs, clicks or gallops Musculoskeletal - slightly stoop posture, ambulates with cane. Slow gait. Hands - + enlargement of DIP jts Extremities -no LE edema  Results for orders placed or performed in visit on 05/31/17  POCT glucose (manual entry)  Result Value Ref Range   POC Glucose 141 (A) 70 - 99 mg/dl  POCT glycosylated hemoglobin (Hb A1C)  Result Value Ref Range   Hemoglobin A1C 6.0     ASSESSMENT AND PLAN: 1. Gait disturbance -pt clinically much improved. However, will still refer for P.T -She has been socially isolated for the past several weeks so much so that she is not getting out to get meals at [employer] the way she usually would. I have encouraged her to start going back to [employer] for her meals as the amount that she is eating is not sufficient to maintain proper health. We also discussed Meals on Wheels as an option so that she is not having to go out 3 times a day. She would like to explore this option. We will have our case worker call her -advised to  get back in to see her eye doctor for recheck - Ambulatory referral to Physical Therapy  2. Dizziness Almost resolve  3. 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) -continue Gabapentin - POCT glucose (manual entry) - POCT glycosylated hemoglobin (Hb A1C)  4. Essential hypertension BP has been good. Will give a trail of stopping the Lisinopril. Nurse only BP check in 1-2 wks  5. Primary osteoarthritis of both hands On Tramadol PRN  6. Chronic low back pain without sciatica, unspecified back pain laterality -on Tramadol PRN  7. Influenza vaccination declined  Patient was given the opportunity to ask questions.  Patient verbalized understanding of the plan and was able to repeat key elements of the plan.   Orders Placed This Encounter  Procedures  . Ambulatory referral to Physical Therapy  . POCT glucose (manual entry)  . POCT glycosylated hemoglobin (Hb A1C)     Requested Prescriptions    No prescriptions requested or ordered in this encounter    Return in about 2 months (around 07/31/2017).  Karle Plumber, MD, FACP

## 2017-05-31 NOTE — Patient Instructions (Signed)
Give nurse only BP check in 1-2 weeks.   Stop Lisinopril.  Try to eat 3 solid meals a day.

## 2017-06-05 ENCOUNTER — Telehealth: Payer: Self-pay

## 2017-06-05 NOTE — Telephone Encounter (Signed)
Attempted to contact the patient at the request of Dr Wynetta Emery to discuss MOW and other community resources that may benefit the patient. Call placed to # [phone] (M), no answer and the voice mailbox has not been set up yet.  Unable to leave a message.  CM to call back at a later time.

## 2017-06-07 ENCOUNTER — Telehealth: Payer: Self-pay

## 2017-06-07 NOTE — Telephone Encounter (Signed)
Attempted to contact the patient again to discuss her need for community services including MOW.  We need to determine what county she lives in in order to provide the correct resource information. Call placed to # [phone] and the voice mailbox has not been set up, unable to leave a message. Call placed to her emergency contact - Kandice Hams ( brother) and a message was left with him to have her call [facility] # [phone] and ask for [name] or Venetia Night. He said that he would " try" to get the message to her.

## 2017-06-11 ENCOUNTER — Telehealth: Payer: Self-pay
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
## 2017-07-10 ENCOUNTER — Other Ambulatory Visit: Payer: Self-pay | Admitting: Internal Medicine

## 2017-07-10 ENCOUNTER — Telehealth: Payer: Self-pay | Admitting: Internal Medicine

## 2017-07-10 NOTE — Telephone Encounter (Signed)
Pt came in to request a refill on - traMADol (ULTRAM) 50 MG tablet  Please follow up

## 2017-07-10 NOTE — Telephone Encounter (Signed)
Pt came in to request advice on weather she can take  2 gabapentin (NEURONTIN) 300 MG capsule  In replacement until she gets her tramadol refilled. Please call her back with a follow up.

## 2017-07-15 MED FILL — traMADol HCL 50 MG TABS: 50 | 30 days supply | Qty: 90 | Fill #1

## 2017-07-16 ENCOUNTER — Other Ambulatory Visit: Payer: Self-pay | Admitting: Pharmacist

## 2017-07-16 MED ORDER — OXYBUTYNIN CHLORIDE ER 5 MG PO TB24: 5.0000 mg | ORAL_TABLET | Freq: Every day | ORAL | 0 refills | Status: DC

## 2017-07-16 NOTE — Telephone Encounter (Signed)
Unable to leave message for patient. Voicemail box has not been set up.

## 2017-07-22 NOTE — Telephone Encounter (Signed)
Pt states she hides her medication and when saw she didn't have any she called.  She was out for a few days. She states she lets her prescription run out before she gets a refill. She was able to refill in December but was out November 25 until December, when she was able to refill.  She quesetions, "What will I be able to do in the future that I  lapse between December and January. This is the second time this has happened. Can I take 2 gabapentin?"  She denies taking too many or not as directed. Please advise.

## 2017-07-23 MED FILL — OXYBUTYNIN CL ER 5 MG TAB: 5 | 30 days supply | Qty: 30 | Fill #0

## 2017-07-23 NOTE — Telephone Encounter (Signed)
Pt aware to take medication as prescribe. Encouraged to find pain relief with other methods: ice, heat, elevation, rest OTC medication that she can tolerate before using Tramadol to last for the amount of time needed. Pt verbalized understanding.

## 2017-07-24 ENCOUNTER — Other Ambulatory Visit: Payer: Self-pay | Admitting: Pharmacist

## 2017-07-24 MED ORDER — OXYBUTYNIN CHLORIDE ER 5 MG PO TB24: 5.0000 mg | ORAL_TABLET | Freq: Every day | ORAL | 0 refills | Status: DC

## 2017-08-01 ENCOUNTER — Encounter: Payer: Self-pay | Admitting: Internal Medicine

## 2017-08-01 ENCOUNTER — Other Ambulatory Visit: Payer: Self-pay

## 2017-08-01 ENCOUNTER — Ambulatory Visit: Payer: Medicare HMO | Attending: Internal Medicine | Admitting: Internal Medicine

## 2017-08-01 ENCOUNTER — Telehealth: Payer: Self-pay

## 2017-08-01 VITALS — BP 131/68 | HR 61 | Temp 98.7°F | Resp 18 | Ht 65.0 in | Wt 180.2 lb

## 2017-08-01 DIAGNOSIS — M170 Bilateral primary osteoarthritis of knee: Secondary | ICD-10-CM | POA: Insufficient documentation

## 2017-08-01 DIAGNOSIS — H539 Unspecified visual disturbance: Secondary | ICD-10-CM | POA: Diagnosis not present

## 2017-08-01 DIAGNOSIS — N182 Chronic kidney disease, stage 2 (mild): Secondary | ICD-10-CM | POA: Diagnosis not present

## 2017-08-01 DIAGNOSIS — I129 Hypertensive chronic kidney disease with stage 1 through stage 4 chronic kidney disease, or unspecified chronic kidney disease: Secondary | ICD-10-CM | POA: Diagnosis not present

## 2017-08-01 DIAGNOSIS — Z880 Allergy status to penicillin: Secondary | ICD-10-CM | POA: Insufficient documentation

## 2017-08-01 DIAGNOSIS — Z7984 Long term (current) use of oral hypoglycemic drugs: Secondary | ICD-10-CM | POA: Insufficient documentation

## 2017-08-01 DIAGNOSIS — N183 Chronic kidney disease, stage 3 unspecified: Secondary | ICD-10-CM | POA: Insufficient documentation

## 2017-08-01 DIAGNOSIS — M545 Low back pain, unspecified: Secondary | ICD-10-CM

## 2017-08-01 DIAGNOSIS — G5603 Carpal tunnel syndrome, bilateral upper limbs: Secondary | ICD-10-CM | POA: Diagnosis not present

## 2017-08-01 DIAGNOSIS — R079 Chest pain, unspecified: Secondary | ICD-10-CM | POA: Diagnosis not present

## 2017-08-01 DIAGNOSIS — Z79899 Other long term (current) drug therapy: Secondary | ICD-10-CM | POA: Diagnosis not present

## 2017-08-01 DIAGNOSIS — R269 Unspecified abnormalities of gait and mobility: Secondary | ICD-10-CM | POA: Diagnosis not present

## 2017-08-01 DIAGNOSIS — Z590 Homelessness: Secondary | ICD-10-CM | POA: Diagnosis not present

## 2017-08-01 DIAGNOSIS — E1149 Type 2 diabetes mellitus with other diabetic neurological complication: Secondary | ICD-10-CM | POA: Insufficient documentation

## 2017-08-01 DIAGNOSIS — R413 Other amnesia: Secondary | ICD-10-CM | POA: Diagnosis not present

## 2017-08-01 DIAGNOSIS — G8929 Other chronic pain: Secondary | ICD-10-CM | POA: Insufficient documentation

## 2017-08-01 DIAGNOSIS — Z9114 Patient's other noncompliance with medication regimen: Secondary | ICD-10-CM | POA: Diagnosis not present

## 2017-08-01 DIAGNOSIS — N289 Disorder of kidney and ureter, unspecified: Secondary | ICD-10-CM | POA: Diagnosis not present

## 2017-08-01 DIAGNOSIS — M2550 Pain in unspecified joint: Secondary | ICD-10-CM

## 2017-08-01 DIAGNOSIS — E1122 Type 2 diabetes mellitus with diabetic chronic kidney disease: Secondary | ICD-10-CM | POA: Diagnosis not present

## 2017-08-01 MED ORDER — TRAMADOL HCL 50 MG PO TABS: 50.0000 mg | ORAL_TABLET | Freq: Three times a day (TID) | ORAL | 2 refills | Status: DC | PRN

## 2017-08-01 NOTE — Telephone Encounter (Signed)
Met with the patient at the request of Dr Wynetta Emery to provide resource information about eyeglasses.   Call placed to [place] [facility] # [phone] to inquire if the patient is eligible for eyeglasses through the [facility]. HIPAA compliant voicemail message left requesting a call back to # [phone]. The patient was also provided this contact information.   Call placed to [place] # [phone] to inquire about the member's vision benefit. Spoke to [place] who stated that the member has a $0 co-pay for an eye exam with an in network provider. She also has a $100 allowance for frames/lenses then 20% off after that with an in network eyeglasses provider including: [employer], [employer], [employer].  There are other options for frames/lenses suppliers and the patient can contact [place] for additional information. This information was also provided to the patient. She was very appreciative and then stated that she lost her [facility] card.  A copy of the [facility] card that is on file in Epic was provided to the patient.

## 2017-08-01 NOTE — Progress Notes (Addendum)
Patient ID: Donna Nguyen, female    DOB: [birth year]  MRN: [id number]  CC: Follow-up   Subjective: Donna Nguyen is a 76 y.o. female who presents for chronic ds management. Last seen 2 mths ago. Her concerns today include:  Patient with history of HTN, DM with neuropathy, HL, homelessness and chronic LBP on tramadol and gabapentin, OA of RT hand and knees, possible fibromyalgia . Last pain management agreement updated 06/2016.   1. Gait disturbance: -Meals on Wheels did not work out because she drives -still eating a pack of nabs for breakfast and bag of cashews for dinner. Has lunch at [employer]. Can not stand going out in cold at nights for dinner most nights. Goes to [place] support group on Wednesday nights; they usually have snacks there.  She finds the support group helpful -no further falls -loss cane at [employer]. How using a stick. She has another cane in her Lucianne Lei but needs to find it -did not receive call as yet for P.T  2. OA knees/chronic LBP/memory concerns -had called in last mth requesting RF on Tramadol. She keeps several day supply on her in a med box and hides the rest because people can access her room. She found the bottle but forgot to look to see if she had refills. Took some Aleve when she ran out of tramadol -plans to get a calender and mark on it when she gets RFs. Feels memory and energy level not as good since falls back in 03/2017 -feels pain in legs and numbness in hands have increase. Numbness in hands especially at nights.   3.  Problems with night vision. Saw eye doctor a few mths ago. She feels the eye drops used contributed to worsening of vision.  Has to use readers to watch TV and drive. Has rxn for bifocals but not able to afford to get them. Due to up date her driver's license this mth  4. C/o dull aching in LT upper chest intermittently over past 2 wks. -can occur when she is driving and at rest. Radiates to LT arm at times. Can last 5  mins -she does not smoke.  She is on statin  Patient Active Problem List   Diagnosis Date Noted  . Noncompliance with medications 06/19/2015  . Type 2 diabetes mellitus with stage 2 chronic kidney disease ([place]) 06/19/2015  . Type 2 diabetes mellitus with neurological manifestations ([place]) 03/14/2015  . Memory loss 03/14/2015     Current Outpatient Medications on File Prior to Visit  Medication Sig Dispense Refill  . gabapentin (NEURONTIN) 300 MG capsule Take 1 capsule (300 mg total) by mouth 3 (three) times daily. 90 capsule 2  . metFORMIN (GLUCOPHAGE XR) 500 MG 24 hr tablet Take 1 tablet (500 mg total) by mouth daily with breakfast. 90 tablet 0  . NYSTATIN, TOPICAL, POWD Apply topically.    Marland Kitchen oxybutynin (DITROPAN XL) 5 MG 24 hr tablet Take 1 tablet (5 mg total) by mouth at bedtime. 30 tablet 0  . pravastatin (PRAVACHOL) 20 MG tablet Take 1 tablet (20 mg total) by mouth daily. 90 tablet 0  . triamcinolone cream (KENALOG) 0.1 % Apply 1 application topically 2 (two) times daily. 30 g 0  . Vitamin D, Ergocalciferol, (DRISDOL) 50000 units CAPS capsule Take 1 capsule (50,000 Units total) by mouth every 7 (seven) days. (Patient not taking: Reported on 05/31/2017) 12 capsule 0   No current facility-administered medications on file prior to visit.  Allergies  Allergen Reactions  . Acetaminophen Shortness Of Breath  . Amitriptyline   . Ciprofloxacin   . Epinephrine   . Penicillins   . Metformin And Related Palpitations    Social History   Socioeconomic History  . Marital status: Divorced    Spouse name: Not on file  . Number of children: Not on file  . Years of education: Not on file  . Highest education level: Not on file  Social Needs  . Financial resource strain: Not on file  . Food insecurity - worry: Not on file  . Food insecurity - inability: Not on file  . Transportation needs - medical: Not on file  . Transportation needs - non-medical: Not on file  Occupational  History  . Not on file  Tobacco Use  . Smoking status: Never Smoker  . Smokeless tobacco: Never Used  Substance and Sexual Activity  . Alcohol use: Yes    Alcohol/week: 1.2 - 1.8 oz    Types: 1 - 2 Glasses of wine, 1 Shots of liquor per week    Comment: socially  . Drug use: No  . Sexual activity: Yes    Birth control/protection: Post-menopausal  Other Topics Concern  . Not on file  Social History Narrative  . Not on file    History reviewed. No pertinent family history.  Past Surgical History:  Procedure Laterality Date  . bladdee    . BLADDER SURGERY    . MOLE REMOVAL      ROS: Review of Systems As stated above PHYSICAL EXAM: BP 131/68 (BP Location: Left Arm, Patient Position: Sitting, Cuff Size: Normal)   Pulse 61   Temp 98.7 F (37.1 C) (Oral)   Resp 18   Ht 5\' 5"  (1.651 m)   Wt 192 lb (87.1 kg)   SpO2 99%   BMI 31.95 kg/m   Wt Readings from Last 3 Encounters:  08/01/17 192 lb (87.1 kg)  05/31/17 172 lb 9.6 oz (78.3 kg)  05/14/17 170 lb 12.8 oz (77.5 kg)    Physical Exam Repeat wgh with rain boots and jackets off 180.2 lbs General appearance - pleasant elderly female in NAD Mental status - alert, oriented to person, place, and time. Clothing looks disheveled Mouth -moist oral mucosa Neck - supple, no significant adenopathy Chest - clear to auscultation, no wheezes, rales or rhonchi, symmetric air entry Heart - RRR with ectopy, no gallops/murmurs Musculoskeletal - grip 4/5 BL. + Tinel's sign BL. Gait: wide base. Has to hold on to objects if not using her make-shift cane Extremities - no LE edema Psychiatric: scored 28/30 on MMSE (scored 5/5 on orientation to place but I did not know how to go back in system and make this change).  Lab Results  Component Value Date   WBC 8.7 01/01/2017   HGB 13.6 01/01/2017   HCT 41.9 01/01/2017   MCV 91 01/01/2017   PLT 282 01/01/2017     Chemistry      Component Value Date/Time   NA 142 01/01/2017 1219   K  4.5 01/01/2017 1219   CL 100 01/01/2017 1219   CO2 26 01/01/2017 1219   BUN 18 01/01/2017 1219   CREATININE 1.20 (H) 01/01/2017 1219   CREATININE 1.40 (H) 06/20/2016 1020      Component Value Date/Time   CALCIUM 9.3 01/01/2017 1219   ALKPHOS 96 01/01/2017 1219   AST 15 01/01/2017 1219   ALT 13 01/01/2017 1219   BILITOT 0.2 01/01/2017 1219  EKG: NSR with PVCs. No ischemic changes  ASSESSMENT AND PLAN: 1. Gait disturbance 2. Chronic low back pain without sciatica, unspecified back pain laterality 3. Primary osteoarthritis of both knees -will have our referral person check on her P.T referral. Advise to use cane with ambulation -I decline increasing dose of Tramadol or Gabapentin. Even though she denies any sedation, increase dose may cause this and increase risk for falls -up dated her Control Substance Prescribing agreement. RF Tramadol 50 mg TID with 2 RFs first RF on or after 08/14/2017 - traMADol (ULTRAM) 50 MG tablet; Take 1 tablet (50 mg total) by mouth every 8 (eight) hours as needed. for pain  Dispense: 90 tablet; Refill: 2  4. Vision disturbance -encouraged her to get rxn fill for new glasses. Our case worker informed her that her insurance does give $100 discount towards purchase then 20% off after that if she goes to [employer] -advise of dangers of driving at night without the prescribed rxn lenses  5. 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 Scored 28/30 on MMSE - TSH - Vitamin B12 - Ambulatory referral to Neurology  6. Renal insufficiency -advised to not take Aleve or other NSAIDS. Recheck BMP today  7. Bilateral carpal tunnel syndrome -pair of wrist splints given to wear at nights  8. Chest pain, unspecified type - EKG 12-Lead - Ambulatory referral to Cardiology  9. Type 2 diabetes mellitus with neurological manifestations ([place]) - Comprehensive metabolic panel - CBC   Patient was given the opportunity to ask questions.   Patient verbalized understanding of the plan and was able to repeat key elements of the plan.   Orders Placed This Encounter  Procedures  . Comprehensive metabolic panel  . CBC  . TSH  . Vitamin B12  . EKG 12-Lead     Requested Prescriptions   Signed Prescriptions Disp Refills  . traMADol (ULTRAM) 50 MG tablet 90 tablet 2    Sig: Take 1 tablet (50 mg total) by mouth every 8 (eight) hours as needed. for pain    Return in about 2 months (around 10/02/2017).  Karle Plumber, MD, FACP

## 2017-08-01 NOTE — Patient Instructions (Signed)
Stop Aleve or Advil. You have been referred to see cardiologist for chest pain and neurologist for your memory. Please get the prescription fill for your new glasses.

## 2017-08-02 ENCOUNTER — Other Ambulatory Visit: Payer: Self-pay | Admitting: Internal Medicine

## 2017-08-02 ENCOUNTER — Ambulatory Visit: Payer: Self-pay | Admitting: Internal Medicine

## 2017-08-02 LAB — COMPREHENSIVE METABOLIC PANEL
A/G RATIO: 1.7 (ref 1.2–2.2)
ALK PHOS: 95 IU/L (ref 39–117)
ALT: 16 IU/L (ref 0–32)
AST: 18 IU/L (ref 0–40)
Albumin: 4.4 g/dL (ref 3.5–4.8)
BILIRUBIN TOTAL: 0.3 mg/dL (ref 0.0–1.2)
BUN/Creatinine Ratio: 15 (ref 12–28)
BUN: 18 mg/dL (ref 8–27)
CHLORIDE: 106 mmol/L (ref 96–106)
CO2: 23 mmol/L (ref 20–29)
Calcium: 9.5 mg/dL (ref 8.7–10.3)
Creatinine, Ser: 1.23 mg/dL — ABNORMAL HIGH (ref 0.57–1.00)
GFR calc Af Amer: 50 mL/min/{1.73_m2} — ABNORMAL LOW (ref >59)
GFR calc non Af Amer: 43 mL/min/{1.73_m2} — ABNORMAL LOW (ref >59)
GLUCOSE: 83 mg/dL (ref 65–99)
Globulin, Total: 2.6 g/dL (ref 1.5–4.5)
POTASSIUM: 4.2 mmol/L (ref 3.5–5.2)
Sodium: 142 mmol/L (ref 134–144)
Total Protein: 7 g/dL (ref 6.0–8.5)

## 2017-08-02 LAB — CBC
HEMATOCRIT: 41 % (ref 34.0–46.6)
HEMOGLOBIN: 14.1 g/dL (ref 11.1–15.9)
MCH: 30.9 pg (ref 26.6–33.0)
MCHC: 34.4 g/dL (ref 31.5–35.7)
MCV: 90 fL (ref 79–97)
Platelets: 276 10*3/uL (ref 150–379)
RBC: 4.57 x10E6/uL (ref 3.77–5.28)
RDW: 13.9 % (ref 12.3–15.4)
WBC: 12.8 10*3/uL — AB (ref 3.4–10.8)

## 2017-08-02 LAB — VITAMIN B12: VITAMIN B 12: 212 pg/mL — AB (ref 232–1245)

## 2017-08-02 LAB — TSH: TSH: 2.56 u[IU]/mL (ref 0.450–4.500)

## 2017-08-02 MED ORDER — VITAMIN B-12 500 MCG PO TABS: 500.0000 ug | ORAL_TABLET | Freq: Two times a day (BID) | ORAL | 2 refills | Status: DC

## 2017-08-05 ENCOUNTER — Other Ambulatory Visit: Payer: Self-pay

## 2017-08-05 ENCOUNTER — Telehealth: Payer: Self-pay

## 2017-08-05 MED ORDER — VITAMIN B-12 500 MCG PO TABS: 500.0000 ug | ORAL_TABLET | Freq: Two times a day (BID) | ORAL | 2 refills | Status: DC

## 2017-08-05 NOTE — Telephone Encounter (Signed)
-----   Message from Ladell Pier, MD sent at 08/02/2017  9:05 PM EST ----- Let pt know that her kidney function is not 100% but stable compared to May of this year. Do not take any over th counter pain medications as these can make kidney function worse.  Her vitamin B 12 level is low. This can cause problems with memory and poor balance with walking. Can also cause numbness in arms and legs. I recommend taking Vit B 12 500 mcg twice a day. I have sent the prescription to [facility] on [address]. It is important that she starts taking this right away.

## 2017-08-05 NOTE — Telephone Encounter (Signed)
Patient called and I gave her the results that you left. She stated that she uses the pharmacy here. [facility] on [address]. Please resend the prescription. Please Fu with patient.

## 2017-08-05 NOTE — Telephone Encounter (Signed)
-----   Message from Ladell Pier, MD sent at 08/02/2017  9:05 PM EST ----- Let pt know that her kidney function is not 100% but stable compared to May of this year. Do not take any over th counter pain medications as these can make kidney function worse.  Her vitamin B 12 level is low. This can cause problems with memory and poor balance with walking. Can also cause numbness in arms and legs. I recommend taking Vit B 12 500 mcg twice a day. I have sent the prescription to [facility] on [address]. It is important that she starts taking this right away.

## 2017-08-05 NOTE — Telephone Encounter (Signed)
Sent Rx Vitamin B 12 to [facility].

## 2017-08-09 ENCOUNTER — Telehealth: Payer: Self-pay | Admitting: Internal Medicine

## 2017-08-09 DIAGNOSIS — M797 Fibromyalgia: Secondary | ICD-10-CM

## 2017-08-09 DIAGNOSIS — M1991 Primary osteoarthritis, unspecified site: Secondary | ICD-10-CM

## 2017-08-09 NOTE — Telephone Encounter (Signed)
Will forward to pcp

## 2017-08-09 NOTE — Telephone Encounter (Signed)
Pt. Called stating that her pain medication is not helping her and she would like to be referred out to pain management. Pt. Also stated that she has appt. Set up with cardiology and neurologist. Please f/u

## 2017-08-14 MED FILL — traMADol HCL 50 MG TABS: 50 | 30 days supply | Qty: 90 | Fill #0

## 2017-08-30 MED FILL — OXYBUTYNIN CL ER 5 MG TAB: 5 | 30 days supply | Qty: 30 | Fill #0

## 2017-08-30 MED FILL — GABAPENTIN 300 MG CAPSULE: 300 | 30 days supply | Qty: 90 | Fill #1

## 2017-09-05 ENCOUNTER — Other Ambulatory Visit: Payer: Self-pay

## 2017-09-05 ENCOUNTER — Ambulatory Visit: Payer: Medicare HMO | Attending: Internal Medicine | Admitting: Rehabilitative and Restorative Service Providers"

## 2017-09-05 DIAGNOSIS — R2689 Other abnormalities of gait and mobility: Secondary | ICD-10-CM

## 2017-09-05 DIAGNOSIS — R42 Dizziness and giddiness: Secondary | ICD-10-CM | POA: Insufficient documentation

## 2017-09-05 DIAGNOSIS — R2681 Unsteadiness on feet: Secondary | ICD-10-CM | POA: Insufficient documentation
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
## 2017-09-13 ENCOUNTER — Ambulatory Visit: Payer: Medicare HMO | Admitting: Cardiovascular Disease

## 2017-09-17 ENCOUNTER — Telehealth: Payer: Self-pay

## 2017-09-17 MED FILL — traMADol HCL 50 MG TABS: 50 | 30 days supply | Qty: 90 | Fill #1
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
## 2017-09-18 ENCOUNTER — Other Ambulatory Visit: Payer: Self-pay

## 2017-09-18 ENCOUNTER — Ambulatory Visit: Payer: Medicare HMO | Attending: Internal Medicine | Admitting: Internal Medicine

## 2017-09-18 ENCOUNTER — Encounter: Payer: Self-pay | Admitting: Internal Medicine

## 2017-09-18 VITALS — BP 145/85 | HR 83 | Temp 97.9°F | Resp 16 | Wt 191.0 lb

## 2017-09-18 DIAGNOSIS — M797 Fibromyalgia: Secondary | ICD-10-CM | POA: Diagnosis not present

## 2017-09-18 DIAGNOSIS — W19XXXA Unspecified fall, initial encounter: Secondary | ICD-10-CM | POA: Insufficient documentation

## 2017-09-18 DIAGNOSIS — N644 Mastodynia: Secondary | ICD-10-CM | POA: Diagnosis not present

## 2017-09-18 DIAGNOSIS — I10 Essential (primary) hypertension: Secondary | ICD-10-CM | POA: Insufficient documentation

## 2017-09-18 DIAGNOSIS — E119 Type 2 diabetes mellitus without complications: Secondary | ICD-10-CM | POA: Diagnosis not present

## 2017-09-18 DIAGNOSIS — Z880 Allergy status to penicillin: Secondary | ICD-10-CM | POA: Insufficient documentation

## 2017-09-18 DIAGNOSIS — Z7984 Long term (current) use of oral hypoglycemic drugs: Secondary | ICD-10-CM | POA: Insufficient documentation

## 2017-09-18 DIAGNOSIS — Z79899 Other long term (current) drug therapy: Secondary | ICD-10-CM | POA: Insufficient documentation

## 2017-09-18 DIAGNOSIS — I340 Nonrheumatic mitral (valve) insufficiency: Secondary | ICD-10-CM | POA: Insufficient documentation

## 2017-09-18 DIAGNOSIS — Z8673 Personal history of transient ischemic attack (TIA), and cerebral infarction without residual deficits: Secondary | ICD-10-CM | POA: Insufficient documentation

## 2017-09-18 DIAGNOSIS — T07XXXA Unspecified multiple injuries, initial encounter: Secondary | ICD-10-CM | POA: Insufficient documentation
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
## 2017-09-18 NOTE — Progress Notes (Signed)
Subjective:  Patient ID: Donna Nguyen, female    DOB: [birth year]  Age: 77 y.o. MRN: [id number]  CC: Breast Pain and Fall  HPI Donna Nguyen is a 77 year old female that presents today for c/o breast pain and bruising after a fall.  Reports a fall 10 days ago after church. She was walking out of service when she lost her balance and fell on concrete. She uses a cane or walker occasionally but she did not have it at this time. She says that she had done physical therapy 3 days earlier and feels that the therapist overexerted her. She was already feeling weak when she fell on Sunday.   She wears a lanyard around her neck with maze and plastic scissors that she uses to open ketchup packets during meals. When she fell, the handle of scissor hit her left breast. She did not bleed and there was no puncture wound. After the fall, she experienced difficulty moving and deep breathing due to pain.Today, she reports improvement of pain and only has soreness when she touches the bruised area. She is now breathing without pain and moves well. Per patient, bruising has improved.   Past Medical History:  Diagnosis Date  . Diabetes mellitus without complication ([place])   . Hypertension   . MI (mitral incompetence)   . Stroke [facility])    Past Surgical History:  Procedure Laterality Date  . bladdee    . BLADDER SURGERY    . MOLE REMOVAL     Allergies  Allergen Reactions  . Acetaminophen Shortness Of Breath  . Amitriptyline   . Ciprofloxacin   . Epinephrine   . Penicillins   . Metformin And Related Palpitations    Outpatient Medications Prior to Visit  Medication Sig Dispense Refill  . gabapentin (NEURONTIN) 300 MG capsule Take 1 capsule (300 mg total) by mouth 3 (three) times daily. 90 capsule 2  . metFORMIN (GLUCOPHAGE XR) 500 MG 24 hr tablet Take 1 tablet (500 mg total) by mouth daily with breakfast. 90 tablet 0  . oxybutynin (DITROPAN XL) 5 MG 24 hr tablet Take 1 tablet (5 mg total) by  mouth at bedtime. 30 tablet 0  . pravastatin (PRAVACHOL) 20 MG tablet Take 1 tablet (20 mg total) by mouth daily. 90 tablet 0  . traMADol (ULTRAM) 50 MG tablet Take 1 tablet (50 mg total) by mouth every 8 (eight) hours as needed. for pain 90 tablet 2  . triamcinolone cream (KENALOG) 0.1 % Apply 1 application topically 2 (two) times daily. 30 g 0  . NYSTATIN, TOPICAL, POWD Apply topically.    . vitamin B-12 (CYANOCOBALAMIN) 500 MCG tablet Take 1 tablet (500 mcg total) by mouth 2 (two) times daily. (Patient not taking: Reported on 09/18/2017) 120 tablet 2  . Vitamin D, Ergocalciferol, (DRISDOL) 50000 units CAPS capsule Take 1 capsule (50,000 Units total) by mouth every 7 (seven) days. (Patient not taking: Reported on 05/31/2017) 12 capsule 0   No facility-administered medications prior to visit.     ROS Review of Systems  Respiratory: Positive for chest tightness (improved). Negative for cough and shortness of breath.   Cardiovascular: Negative for chest pain.  Neurological: Positive for weakness (gait disturbance present). Negative for numbness.  Hematological: Does not bruise/bleed easily.    Objective:  BP (!) 145/85   Pulse 83   Temp 97.9 F (36.6 C) (Oral)   Resp 16   Wt 191 lb (86.6 kg)   SpO2 97%  BMI 31.78 kg/m   BP/Weight 09/18/2017 08/01/2017 88/32/5498  Systolic BP 264 158 309  Diastolic BP 85 68 68  Wt. (Lbs) 191 180.2 172.6  BMI 31.78 29.99 29.17  Some encounter information is confidential and restricted. Go to Review Flowsheets activity to see all data.    Physical Exam  Constitutional: She is oriented to person, place, and time. She appears well-developed. No distress.  Cardiovascular: Normal rate, regular rhythm and normal heart sounds.  Pulmonary/Chest: Effort normal and breath sounds normal. She exhibits tenderness (only on left breast tissue. No tenderness of sternum noted. ).  Musculoskeletal: Normal range of motion.  Neurological: She is alert and oriented  to person, place, and time.  Skin: Skin is warm.  Ecchymosis of lower left breast tissue.     Assessment & Plan:   1. Fall, initial encounter  Discussed fall prevention and continued use of cane/walker. Patient said she would look into a life alert. Encouraged precaution with her lanyard.   2. 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   Follow-up: Return in about 4 weeks (around 10/16/2017), or if symptoms worsen or fail to improve.   Fabian November DNP student   Evaluation and management procedures were performed by me with DNP Student in attendance, note written by DNP student under my supervision and collaboration. I have reviewed the note and I agree with the management and plan.   Angelica Chessman, MD, MHA, [place], Karilyn Cota [facility] [place], [place]   09/26/2017, 3:54 PM

## 2017-09-18 NOTE — Patient Instructions (Signed)
Fall Prevention in the Home Falls can cause injuries and can affect people from all age groups. There are many simple things that you can do to make your home safe and to help prevent falls. What can I do on the outside of my home?  Regularly repair the edges of walkways and driveways and fix any cracks.  Remove high doorway thresholds.  Trim any shrubbery on the main path into your home.  Use bright outdoor lighting.  Clear walkways of debris and clutter, including tools and rocks.  Regularly check that handrails are securely fastened and in good repair. Both sides of any steps should have handrails.  Install guardrails along the edges of any raised decks or porches.  Have leaves, snow, and ice cleared regularly.  Use sand or salt on walkways during winter months.  In the garage, clean up any spills right away, including grease or oil spills. What can I do in the bathroom?  Use night lights.  Install grab bars by the toilet and in the tub and shower. Do not use towel bars as grab bars.  Use non-skid mats or decals on the floor of the tub or shower.  If you need to sit down while you are in the shower, use a plastic, non-slip stool.  Keep the floor dry. Immediately clean up any water that spills on the floor.  Remove soap buildup in the tub or shower on a regular basis.  Attach bath mats securely with double-sided non-slip rug tape.  Remove throw rugs and other tripping hazards from the floor. What can I do in the bedroom?  Use night lights.  Make sure that a bedside light is easy to reach.  Do not use oversized bedding that drapes onto the floor.  Have a firm chair that has side arms to use for getting dressed.  Remove throw rugs and other tripping hazards from the floor. What can I do in the kitchen?  Clean up any spills right away.  Avoid walking on wet floors.  Place frequently used items in easy-to-reach places.  If you need to reach for something above  you, use a sturdy step stool that has a grab bar.  Keep electrical cables out of the way.  Do not use floor polish or wax that makes floors slippery. If you have to use wax, make sure that it is non-skid floor wax.  Remove throw rugs and other tripping hazards from the floor. What can I do in the stairways?  Do not leave any items on the stairs.  Make sure that there are handrails on both sides of the stairs. Fix handrails that are broken or loose. Make sure that handrails are as long as the stairways.  Check any carpeting to make sure that it is firmly attached to the stairs. Fix any carpet that is loose or worn.  Avoid having throw rugs at the top or bottom of stairways, or secure the rugs with carpet tape to prevent them from moving.  Make sure that you have a light switch at the top of the stairs and the bottom of the stairs. If you do not have them, have them installed. What are some other fall prevention tips?  Wear closed-toe shoes that fit well and support your feet. Wear shoes that have rubber soles or low heels.  When you use a stepladder, make sure that it is completely opened and that the sides are firmly locked. Have someone hold the ladder while you are using   it. Do not climb a closed stepladder.  Add color or contrast paint or tape to grab bars and handrails in your home. Place contrasting color strips on the first and last steps.  Use mobility aids as needed, such as canes, walkers, scooters, and crutches.  Turn on lights if it is dark. Replace any light bulbs that burn out.  Set up furniture so that there are clear paths. Keep the furniture in the same spot.  Fix any uneven floor surfaces.  Choose a carpet design that does not hide the edge of steps of a stairway.  Be aware of any and all pets.  Review your medicines with your healthcare provider. Some medicines can cause dizziness or changes in blood pressure, which increase your risk of falling. Talk with  your health care provider about other ways that you can decrease your risk of falls. This may include working with a physical therapist or trainer to improve your strength, balance, and endurance. This information is not intended to replace advice given to you by your health care provider. Make sure you discuss any questions you have with your health care provider. Document Released: 07/27/2002 Document Revised: 01/03/2016 Document Reviewed: 09/10/2014 Elsevier Interactive Patient Education  2018 Elsevier Inc.  

## 2017-10-04 ENCOUNTER — Ambulatory Visit: Payer: Medicare HMO | Admitting: Neurology

## 2017-10-07 ENCOUNTER — Ambulatory Visit: Payer: Self-pay | Admitting: Internal Medicine

## 2017-10-18 ENCOUNTER — Telehealth: Payer: Self-pay | Admitting: Internal Medicine

## 2017-10-18 ENCOUNTER — Other Ambulatory Visit: Payer: Self-pay | Admitting: Internal Medicine

## 2017-10-18 DIAGNOSIS — E1142 Type 2 diabetes mellitus with diabetic polyneuropathy: Secondary | ICD-10-CM

## 2017-10-18 NOTE — Telephone Encounter (Signed)
Pt called to request a refill on  -oxybutynin (DITROPAN XL) 5 MG 24 hr tablet -gabapentin (NEURONTIN) 300 MG capsule  If approved please send to [facility] *she would like a call back because she has diahrea and needs advice on how to feel better.

## 2017-10-19 MED ORDER — GABAPENTIN 300 MG PO CAPS: 300.0000 mg | ORAL_CAPSULE | Freq: Three times a day (TID) | ORAL | 2 refills | Status: DC

## 2017-10-19 MED ORDER — OXYBUTYNIN CHLORIDE ER 5 MG PO TB24: 5.0000 mg | ORAL_TABLET | Freq: Every day | ORAL | 3 refills | Status: DC

## 2017-10-19 NOTE — Addendum Note (Signed)
Addended by: Karle Plumber B on: 10/19/2017 08:51 AM   Modules accepted: Orders

## 2017-10-19 NOTE — Telephone Encounter (Signed)
Phone call returned to patient this morning.  Message left on voicemail telling her to drink fluids and use some Pepto-Bismol over-the-counter as needed for the diarrhea.  I also recommended eating banana and toast to help settle her stomach.  I advised her that refills will be sent on the medicines requested to pharmacy.

## 2017-10-21 MED FILL — GABAPENTIN 300 MG CAPSULE: 300 | 30 days supply | Qty: 90 | Fill #0

## 2017-10-21 MED FILL — OXYBUTYNIN CL ER 5 MG TAB: 5 | 30 days supply | Qty: 30 | Fill #0

## 2017-10-30 ENCOUNTER — Ambulatory Visit: Payer: Medicare HMO | Admitting: Neurology

## 2017-10-30 ENCOUNTER — Telehealth: Payer: Self-pay | Admitting: Neurology

## 2017-10-30 NOTE — Telephone Encounter (Signed)
This patient did not show for a new patient appointment today. 

## 2017-11-04 ENCOUNTER — Encounter: Payer: Self-pay | Admitting: Internal Medicine

## 2017-11-04 ENCOUNTER — Ambulatory Visit: Payer: Medicare HMO | Attending: Internal Medicine | Admitting: Licensed Clinical Social Worker

## 2017-11-04 ENCOUNTER — Ambulatory Visit: Payer: Medicare HMO | Attending: Internal Medicine | Admitting: Internal Medicine

## 2017-11-04 VITALS — BP 135/76 | HR 72 | Temp 98.1°F | Resp 16 | Ht 66.0 in | Wt 191.6 lb

## 2017-11-04 DIAGNOSIS — W57XXXA Bitten or stung by nonvenomous insect and other nonvenomous arthropods, initial encounter: Secondary | ICD-10-CM | POA: Diagnosis not present

## 2017-11-04 DIAGNOSIS — E559 Vitamin D deficiency, unspecified: Secondary | ICD-10-CM

## 2017-11-04 DIAGNOSIS — E1122 Type 2 diabetes mellitus with diabetic chronic kidney disease: Secondary | ICD-10-CM | POA: Insufficient documentation

## 2017-11-04 DIAGNOSIS — R42 Dizziness and giddiness: Secondary | ICD-10-CM | POA: Insufficient documentation

## 2017-11-04 DIAGNOSIS — Z7984 Long term (current) use of oral hypoglycemic drugs: Secondary | ICD-10-CM | POA: Insufficient documentation

## 2017-11-04 DIAGNOSIS — Z1331 Encounter for screening for depression: Secondary | ICD-10-CM

## 2017-11-04 DIAGNOSIS — F329 Major depressive disorder, single episode, unspecified: Secondary | ICD-10-CM | POA: Diagnosis not present

## 2017-11-04 DIAGNOSIS — E538 Deficiency of other specified B group vitamins: Secondary | ICD-10-CM | POA: Diagnosis not present

## 2017-11-04 DIAGNOSIS — M545 Low back pain: Secondary | ICD-10-CM | POA: Insufficient documentation

## 2017-11-04 DIAGNOSIS — G5603 Carpal tunnel syndrome, bilateral upper limbs: Secondary | ICD-10-CM | POA: Diagnosis not present

## 2017-11-04 DIAGNOSIS — N182 Chronic kidney disease, stage 2 (mild): Secondary | ICD-10-CM | POA: Diagnosis not present

## 2017-11-04 DIAGNOSIS — G8929 Other chronic pain: Secondary | ICD-10-CM | POA: Diagnosis not present

## 2017-11-04 DIAGNOSIS — Z9114 Patient's other noncompliance with medication regimen: Secondary | ICD-10-CM | POA: Insufficient documentation

## 2017-11-04 DIAGNOSIS — M797 Fibromyalgia: Secondary | ICD-10-CM

## 2017-11-04 DIAGNOSIS — I129 Hypertensive chronic kidney disease with stage 1 through stage 4 chronic kidney disease, or unspecified chronic kidney disease: Secondary | ICD-10-CM | POA: Insufficient documentation

## 2017-11-04 DIAGNOSIS — H538 Other visual disturbances: Secondary | ICD-10-CM

## 2017-11-04 DIAGNOSIS — R269 Unspecified abnormalities of gait and mobility: Secondary | ICD-10-CM

## 2017-11-04 DIAGNOSIS — Z590 Homelessness: Secondary | ICD-10-CM | POA: Diagnosis not present

## 2017-11-04 DIAGNOSIS — Z79899 Other long term (current) drug therapy: Secondary | ICD-10-CM | POA: Insufficient documentation

## 2017-11-04 DIAGNOSIS — A6920 Lyme disease, unspecified: Secondary | ICD-10-CM | POA: Diagnosis not present

## 2017-11-04 DIAGNOSIS — Z881 Allergy status to other antibiotic agents status: Secondary | ICD-10-CM | POA: Diagnosis not present

## 2017-11-04 DIAGNOSIS — Z880 Allergy status to penicillin: Secondary | ICD-10-CM | POA: Insufficient documentation

## 2017-11-04 DIAGNOSIS — E1149 Type 2 diabetes mellitus with other diabetic neurological complication: Secondary | ICD-10-CM

## 2017-11-04 DIAGNOSIS — M2550 Pain in unspecified joint: Secondary | ICD-10-CM

## 2017-11-04 DIAGNOSIS — F418 Other specified anxiety disorders: Secondary | ICD-10-CM

## 2017-11-04 LAB — GLUCOSE, POCT (MANUAL RESULT ENTRY): POC Glucose: 196 mg/dl — AB (ref 70–99)

## 2017-11-04 MED ORDER — TRIAMCINOLONE ACETONIDE 0.1 % EX CREA: 1.0000 "application " | TOPICAL_CREAM | Freq: Two times a day (BID) | CUTANEOUS | 0 refills | Status: DC

## 2017-11-04 MED ORDER — DOXYCYCLINE HYCLATE 100 MG PO TABS: 100.0000 mg | ORAL_TABLET | Freq: Two times a day (BID) | ORAL | 0 refills | Status: DC

## 2017-11-04 MED FILL — DOXYCYCLINE 100 MG TABLET: 100 | 20 days supply | Qty: 40 | Fill #0

## 2017-11-04 MED FILL — traMADol HCL 50 MG TABS: 50 | 30 days supply | Qty: 90 | Fill #2

## 2017-11-04 MED FILL — TRIAMCINOLONE ACETONIDE 0.1: 0.1 | 15 days supply | Qty: 30 | Fill #0

## 2017-11-04 NOTE — Progress Notes (Signed)
Patient ID: Donna Nguyen, female    DOB: [birth year]  MRN: [id number]  CC: Diabetes   Subjective: Donna Nguyen is a 77 y.o. female who presents for chronic ds management Her concerns today include:  Patient with history of HTN, DM with neuropathy, HL, homelessness and chronic LBP on tramadol and gabapentin, OA of RT hand and knees, possible fibromyalgia . Last pain management agreement updated 07/2017.  1.  Pain/gait disturbance: -did one session of P.T and felt it was too much for her so never went back.  She also felt it contributed to her fall 08/2017 because she was over worked -feels weak if she does too much -uses walker or cane off and on.  No falls since last visit in 09/18/2017 but has had near misses.   2.  Muscle in buttocks, arms and legs hurt -starting taking a tablespoon of "Bootleger" (which she states is a cheap malt wine) daily for 5 days to 2 wks upon recommend from a friend at the [facility].  Afterwards, she had no pain for 1 mth.   "Its mericulous what it does.  Its killing off something that does not come back for a while." -still taking Gabapentin and Tramadol which helps.  Brings pain down from a "15 to a 6/10."  Denies drowsiness.  Occasional dizziness.  No constipation -Vit B12 was low on last visit. She did obtain B12 supplement and is taking  3. Reports having flu and pneumonia last wk. Had loose stools for several days. Today is first day that BM has returned to normal  4.  Still c/o eyes being worse since eye exam several mths ago. Has to wear reading glasses to watch TV  5.  "Ticks are back." -last wk, she removed 2 large ticks, one on back and other on Lt thigh. Smaller ones around waist line. Skin around waist especially LT side is red, irritated and itches  [place]  She also has 2 dogs. Can not afford meds from vet to prevent fleas and ticks.   6.  DM:  Does not check BS. Still eats peanuts and  peanut butter nabs for BF.  Gets good lunch from shelter  Patient Active Problem List   Diagnosis Date Noted  . Fall 09/18/2017  . Multiple bruises 09/18/2017  . Fibromyalgia 09/18/2017  . 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 Renal insufficiency 08/01/2017  . Bilateral carpal tunnel syndrome 08/01/2017  . Noncompliance with medications 06/19/2015  . 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 Memory loss 03/14/2015     Current Outpatient Medications on File Prior to Visit  Medication Sig Dispense Refill  . gabapentin (NEURONTIN) 300 MG capsule Take 1 capsule (300 mg total) by mouth 3 (three) times daily. 90 capsule 2  . metFORMIN (GLUCOPHAGE XR) 500 MG 24 hr tablet Take 1 tablet (500 mg total) by mouth daily with breakfast. 90 tablet 0  . NYSTATIN, TOPICAL, POWD Apply topically.    Marland Kitchen oxybutynin (DITROPAN XL) 5 MG 24 hr tablet Take 1 tablet (5 mg total) by mouth at bedtime. 30 tablet 3  . pravastatin (PRAVACHOL) 20 MG tablet Take 1 tablet (20 mg total) by mouth daily. 90 tablet 0  . traMADol (ULTRAM) 50 MG tablet Take 1 tablet (50 mg total) by mouth every 8 (eight) hours as needed. for pain  90 tablet 2  . triamcinolone cream (KENALOG) 0.1 % Apply 1 application topically 2 (two) times daily. 30 g 0  . vitamin B-12 (CYANOCOBALAMIN) 500 MCG tablet Take 1 tablet (500 mcg total) by mouth 2 (two) times daily. (Patient not taking: Reported on 09/18/2017) 120 tablet 2  . Vitamin D, Ergocalciferol, (DRISDOL) 50000 units CAPS capsule Take 1 capsule (50,000 Units total) by mouth every 7 (seven) days. (Patient not taking: Reported on 05/31/2017) 12 capsule 0   No current facility-administered medications on file prior to visit.     Allergies  Allergen Reactions  . Acetaminophen Shortness Of Breath  . Amitriptyline   . Ciprofloxacin   . Epinephrine   . Penicillins   . Metformin And  Related Palpitations    Social History   Socioeconomic History  . Marital status: Divorced    Spouse name: Not on file  . Number of children: Not on file  . Years of education: Not on file  . Highest education level: Not on file  Social Needs  . Financial resource strain: Not on file  . Food insecurity - worry: Not on file  . Food insecurity - inability: Not on file  . Transportation needs - medical: Not on file  . Transportation needs - non-medical: Not on file  Occupational History  . Not on file  Tobacco Use  . Smoking status: Never Smoker  . Smokeless tobacco: Never Used  Substance and Sexual Activity  . Alcohol use: Yes    Alcohol/week: 1.2 - 1.8 oz    Types: 1 - 2 Glasses of wine, 1 Shots of liquor per week    Comment: socially  . Drug use: No  . Sexual activity: Yes    Birth control/protection: Post-menopausal  Other Topics Concern  . Not on file  Social History Narrative  . Not on file    No family history on file.  Past Surgical History:  Procedure Laterality Date  . bladdee    . BLADDER SURGERY    . MOLE REMOVAL      ROS: Review of Systems She continues to complain of blurred vision ever since her last eye exam.  She thinks the eyedrops that were placed in her eyes because the issue.  She has called her eye doctor a few times about it but has not gone back for follow-up.  PHYSICAL EXAM: BP 135/76   Pulse 72   Temp 98.1 F (36.7 C) (Oral)   Resp 16   Ht 5\' 6"  (1.676 m)   Wt 191 lb 9.6 oz (86.9 kg)   SpO2 97%   BMI 30.93 kg/m   Wt Readings from Last 3 Encounters:  11/04/17 191 lb 9.6 oz (86.9 kg)  09/18/17 191 lb (86.6 kg)  08/01/17 180 lb 3.2 oz (81.7 kg)    Physical Exam  General appearance -pleasant elderly female in NAD.  She appears unkempt  mental status -she is oriented.  She answers questions appropriately.  Very talkative. Mouth -oral mucosa is moist. Neck - supple, no significant adenopathy Chest - clear to auscultation, no  wheezes, rales or rhonchi, symmetric air entry Heart - normal rate, regular rhythm, normal S1, S2, no murmurs, rubs, clicks or gallops Musculoskeletal -ambulating with a rolling walker today.  She transfers independently. Extremities -no lower extremity edema.  Pulses in both feet are 3+ bilaterally.   Skin -multiple erythematous spots of varying sizes over the left lower abdomen into the groin.  Larger one of about 3 cm on  the left lower posterior thorax  Results for orders placed or performed in visit on 08/01/17  Comprehensive metabolic panel  Result Value Ref Range   Glucose 83 65 - 99 mg/dL   BUN 18 8 - 27 mg/dL   Creatinine, Ser 1.23 (H) 0.57 - 1.00 mg/dL   GFR calc non Af Amer 43 (L) >59 mL/min/1.73   GFR calc Af Amer 50 (L) >59 mL/min/1.73   BUN/Creatinine Ratio 15 12 - 28   Sodium 142 134 - 144 mmol/L   Potassium 4.2 3.5 - 5.2 mmol/L   Chloride 106 96 - 106 mmol/L   CO2 23 20 - 29 mmol/L   Calcium 9.5 8.7 - 10.3 mg/dL   Total Protein 7.0 6.0 - 8.5 g/dL   Albumin 4.4 3.5 - 4.8 g/dL   Globulin, Total 2.6 1.5 - 4.5 g/dL   Albumin/Globulin Ratio 1.7 1.2 - 2.2   Bilirubin Total 0.3 0.0 - 1.2 mg/dL   Alkaline Phosphatase 95 39 - 117 IU/L   AST 18 0 - 40 IU/L   ALT 16 0 - 32 IU/L  CBC  Result Value Ref Range   WBC 12.8 (H) 3.4 - 10.8 x10E3/uL   RBC 4.57 3.77 - 5.28 x10E6/uL   Hemoglobin 14.1 11.1 - 15.9 g/dL   Hematocrit 41.0 34.0 - 46.6 %   MCV 90 79 - 97 fL   MCH 30.9 26.6 - 33.0 pg   MCHC 34.4 31.5 - 35.7 g/dL   RDW 13.9 12.3 - 15.4 %   Platelets 276 150 - 379 x10E3/uL  TSH  Result Value Ref Range   TSH 2.560 0.450 - 4.500 uIU/mL  Vitamin B12  Result Value Ref Range   Vitamin B-12 212 (L) 232 - 1,245 pg/mL    Depression screen [facility] 2/9 11/04/2017 09/18/2017 03/29/2017  Decreased Interest 2 3 3   Down, Depressed, Hopeless 3 2 2   PHQ - 2 Score 5 5 5   Altered sleeping 3 3 3   Tired, decreased energy 2 3 3   Change in appetite 3 3 3   Feeling bad or failure about  yourself  3 2 2   Trouble concentrating 1 2 2   Moving slowly or fidgety/restless 1 1 3   Suicidal thoughts 0 0 0  PHQ-9 Score 18 19 21    Lab Results  Component Value Date   HGBA1C 6.0 05/31/2017   ASSESSMENT AND PLAN: 1. Fibromyalgia 2. Gait disturbance 3. Polyarthralgia I still think physical therapy is her best bet to help in preventing further falls.  But patient wants to hold off at this time.  I have encouraged her to use a walker or cane at all times.  4. Tick bite, initial encounter - triamcinolone cream (KENALOG) 0.1 %; Apply 1 application topically 2 (two) times daily.  Dispense: 30 g; Refill: 0  5. Vitamin B 12 deficiency - Vitamin B12  6. Blurred vision -Encouraged her to follow-up with her eye doctor. -I recommend stopping the Ditropan for now in case this may be causing or contributing to the blurred vision  7. 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  She gets food from the shelter and other agencies in the area that caters to homeless population - POCT glucose (manual entry) - Hemoglobin O2D - Basic Metabolic Panel - LAB CBC W/AUTO DIFF (IA) (74128)  8. Positive depression screening We did not have time to discuss this today but I did have her see the LCSW  9. Erythema migrans (Lyme disease) -Encourage patient to take her dogs to the vet to get proper anti-tic/flea medication. Advised to wear covered clothing when outdoors or in the woods and to do a body check at each shower/bath - triamcinolone cream (KENALOG) 0.1 %; Apply 1 application topically 2 (two) times daily.  Dispense: 30 g; Refill: 0 - doxycycline (VIBRA-TABS) 100 MG tablet; Take 1 tablet (100 mg total) by mouth 2 (two) times daily.  Dispense: 40 tablet; Refill: 0  10. Vitamin D deficiency - VITAMIN D 25 Hydroxy (Vit-D Deficiency, Fractures)  Patient was given the opportunity to ask questions.  Patient verbalized  understanding of the plan and was able to repeat key elements of the plan.   No orders of the defined types were placed in this encounter.    Requested Prescriptions    No prescriptions requested or ordered in this encounter    No Follow-up on file.  Karle Plumber, MD, FACP

## 2017-11-04 NOTE — Patient Instructions (Addendum)
Stop Ditropan.  Start Doxycycline antibiotics to cover for Lyme disease. Use Triamcinolone cream to help decrease itching over lower abdomen.   Fall Prevention in the Home Falls can cause injuries. They can happen to people of all ages. There are many things you can do to make your home safe and to help prevent falls. What can I do on the outside of my home?  Regularly fix the edges of walkways and driveways and fix any cracks.  Remove anything that might make you trip as you walk through a door, such as a raised step or threshold.  Trim any bushes or trees on the path to your home.  Use bright outdoor lighting.  Clear any walking paths of anything that might make someone trip, such as rocks or tools.  Regularly check to see if handrails are loose or broken. Make sure that both sides of any steps have handrails.  Any raised decks and porches should have guardrails on the edges.  Have any leaves, snow, or ice cleared regularly.  Use sand or salt on walking paths during winter.  Clean up any spills in your garage right away. This includes oil or grease spills. What can I do in the bathroom?  Use night lights.  Install grab bars by the toilet and in the tub and shower. Do not use towel bars as grab bars.  Use non-skid mats or decals in the tub or shower.  If you need to sit down in the shower, use a plastic, non-slip stool.  Keep the floor dry. Clean up any water that spills on the floor as soon as it happens.  Remove soap buildup in the tub or shower regularly.  Attach bath mats securely with double-sided non-slip rug tape.  Do not have throw rugs and other things on the floor that can make you trip. What can I do in the bedroom?  Use night lights.  Make sure that you have a light by your bed that is easy to reach.  Do not use any sheets or blankets that are too big for your bed. They should not hang down onto the floor.  Have a firm chair that has side arms. You can  use this for support while you get dressed.  Do not have throw rugs and other things on the floor that can make you trip. What can I do in the kitchen?  Clean up any spills right away.  Avoid walking on wet floors.  Keep items that you use a lot in easy-to-reach places.  If you need to reach something above you, use a strong step stool that has a grab bar.  Keep electrical cords out of the way.  Do not use floor polish or wax that makes floors slippery. If you must use wax, use non-skid floor wax.  Do not have throw rugs and other things on the floor that can make you trip. What can I do with my stairs?  Do not leave any items on the stairs.  Make sure that there are handrails on both sides of the stairs and use them. Fix handrails that are broken or loose. Make sure that handrails are as long as the stairways.  Check any carpeting to make sure that it is firmly attached to the stairs. Fix any carpet that is loose or worn.  Avoid having throw rugs at the top or bottom of the stairs. If you do have throw rugs, attach them to the floor with carpet tape.  Make sure that you have a light switch at the top of the stairs and the bottom of the stairs. If you do not have them, ask someone to add them for you. What else can I do to help prevent falls?  Wear shoes that: ? Do not have high heels. ? Have rubber bottoms. ? Are comfortable and fit you well. ? Are closed at the toe. Do not wear sandals.  If you use a stepladder: ? Make sure that it is fully opened. Do not climb a closed stepladder. ? Make sure that both sides of the stepladder are locked into place. ? Ask someone to hold it for you, if possible.  Clearly mark and make sure that you can see: ? Any grab bars or handrails. ? First and last steps. ? Where the edge of each step is.  Use tools that help you move around (mobility aids) if they are needed. These include: ? Canes. ? Walkers. ? Scooters. ? Crutches.  Turn  on the lights when you go into a dark area. Replace any light bulbs as soon as they burn out.  Set up your furniture so you have a clear path. Avoid moving your furniture around.  If any of your floors are uneven, fix them.  If there are any pets around you, be aware of where they are.  Review your medicines with your doctor. Some medicines can make you feel dizzy. This can increase your chance of falling. Ask your doctor what other things that you can do to help prevent falls. This information is not intended to replace advice given to you by your health care provider. Make sure you discuss any questions you have with your health care provider. Document Released: 06/02/2009 Document Revised: 01/12/2016 Document Reviewed: 09/10/2014 Elsevier Interactive Patient Education  [employer].
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
## 2017-11-05 DIAGNOSIS — R269 Unspecified abnormalities of gait and mobility: Secondary | ICD-10-CM | POA: Insufficient documentation

## 2017-11-05 DIAGNOSIS — A6920 Lyme disease, unspecified: Secondary | ICD-10-CM

## 2017-11-05 DIAGNOSIS — E538 Deficiency of other specified B group vitamins: Secondary | ICD-10-CM | POA: Insufficient documentation

## 2017-11-05 DIAGNOSIS — M2550 Pain in unspecified joint: Secondary | ICD-10-CM | POA: Insufficient documentation

## 2017-11-05 HISTORY — DX: Lyme disease, unspecified: A69.20

## 2017-11-05 LAB — BASIC METABOLIC PANEL
BUN/Creatinine Ratio: 11 — ABNORMAL LOW (ref 12–28)
BUN: 14 mg/dL (ref 8–27)
CALCIUM: 9.5 mg/dL (ref 8.7–10.3)
CHLORIDE: 101 mmol/L (ref 96–106)
CO2: 23 mmol/L (ref 20–29)
Creatinine, Ser: 1.32 mg/dL — ABNORMAL HIGH (ref 0.57–1.00)
GFR calc Af Amer: 46 mL/min/{1.73_m2} — ABNORMAL LOW (ref >59)
GFR calc non Af Amer: 39 mL/min/{1.73_m2} — ABNORMAL LOW (ref >59)
Glucose: 103 mg/dL — ABNORMAL HIGH (ref 65–99)
POTASSIUM: 4.1 mmol/L (ref 3.5–5.2)
Sodium: 139 mmol/L (ref 134–144)

## 2017-11-05 LAB — VITAMIN D 25 HYDROXY (VIT D DEFICIENCY, FRACTURES): Vit D, 25-Hydroxy: 21.2 ng/mL — ABNORMAL LOW (ref 30.0–100.0)

## 2017-11-05 LAB — HEMOGLOBIN A1C
Est. average glucose Bld gHb Est-mCnc: 143 mg/dL
HEMOGLOBIN A1C: 6.6 % — AB (ref 4.8–5.6)

## 2017-11-05 LAB — VITAMIN B12: Vitamin B-12: 513 pg/mL (ref 232–1245)

## 2017-11-06 ENCOUNTER — Other Ambulatory Visit: Payer: Self-pay | Admitting: Internal Medicine

## 2017-11-06 MED ORDER — VITAMIN D 1000 UNITS PO TABS: 1000.0000 [IU] | ORAL_TABLET | Freq: Every day | ORAL | 2 refills | Status: DC

## 2017-11-07 ENCOUNTER — Telehealth: Payer: Self-pay

## 2017-11-07 NOTE — Telephone Encounter (Signed)
Contacted pt to go over lab results pt didn't answer lvm asking pt to give me a call at her earliest convenience  

## 2017-12-04 ENCOUNTER — Other Ambulatory Visit: Payer: Self-pay | Admitting: Internal Medicine

## 2017-12-04 DIAGNOSIS — E1149 Type 2 diabetes mellitus with other diabetic neurological complication: Secondary | ICD-10-CM

## 2017-12-04 DIAGNOSIS — M2550 Pain in unspecified joint: Secondary | ICD-10-CM

## 2017-12-04 MED FILL — GABAPENTIN 300 MG CAPSULE: 300 | 30 days supply | Qty: 90 | Fill #1

## 2017-12-04 MED FILL — PRAVASTATIN SODIUM 20 MG TA: 20 | 30 days supply | Qty: 30 | Fill #0

## 2017-12-04 MED FILL — OXYBUTYNIN CL ER 5 MG TAB: 5 | 30 days supply | Qty: 30 | Fill #1

## 2017-12-05 NOTE — Telephone Encounter (Signed)
Patient requested refill on tramadol.  [place] controlled substance reporting system reviewed and is appropriate.  1 refill given on tramadol 50 mg 1 tablet 3 times a day as needed #90 with no refills.

## 2017-12-17 ENCOUNTER — Ambulatory Visit: Payer: Self-pay | Admitting: Internal Medicine

## 2017-12-17 ENCOUNTER — Ambulatory Visit: Payer: Medicare HMO | Attending: Internal Medicine | Admitting: Internal Medicine

## 2017-12-17 ENCOUNTER — Other Ambulatory Visit: Payer: Self-pay

## 2017-12-17 ENCOUNTER — Encounter: Payer: Self-pay | Admitting: Internal Medicine

## 2017-12-17 VITALS — BP 122/77 | HR 101 | Temp 98.2°F | Resp 16 | Wt 194.8 lb

## 2017-12-17 DIAGNOSIS — Z881 Allergy status to other antibiotic agents status: Secondary | ICD-10-CM | POA: Insufficient documentation

## 2017-12-17 DIAGNOSIS — E1122 Type 2 diabetes mellitus with diabetic chronic kidney disease: Secondary | ICD-10-CM | POA: Diagnosis not present

## 2017-12-17 DIAGNOSIS — Z79899 Other long term (current) drug therapy: Secondary | ICD-10-CM | POA: Diagnosis not present

## 2017-12-17 DIAGNOSIS — E1149 Type 2 diabetes mellitus with other diabetic neurological complication: Secondary | ICD-10-CM | POA: Diagnosis not present

## 2017-12-17 DIAGNOSIS — G8929 Other chronic pain: Secondary | ICD-10-CM | POA: Diagnosis not present

## 2017-12-17 DIAGNOSIS — M545 Low back pain: Secondary | ICD-10-CM | POA: Diagnosis not present

## 2017-12-17 DIAGNOSIS — E538 Deficiency of other specified B group vitamins: Secondary | ICD-10-CM | POA: Insufficient documentation

## 2017-12-17 DIAGNOSIS — M19041 Primary osteoarthritis, right hand: Secondary | ICD-10-CM | POA: Insufficient documentation

## 2017-12-17 DIAGNOSIS — Z7984 Long term (current) use of oral hypoglycemic drugs: Secondary | ICD-10-CM | POA: Insufficient documentation

## 2017-12-17 DIAGNOSIS — H538 Other visual disturbances: Secondary | ICD-10-CM | POA: Diagnosis not present

## 2017-12-17 DIAGNOSIS — E1140 Type 2 diabetes mellitus with diabetic neuropathy, unspecified: Secondary | ICD-10-CM | POA: Diagnosis not present

## 2017-12-17 DIAGNOSIS — M797 Fibromyalgia: Secondary | ICD-10-CM | POA: Insufficient documentation

## 2017-12-17 DIAGNOSIS — I129 Hypertensive chronic kidney disease with stage 1 through stage 4 chronic kidney disease, or unspecified chronic kidney disease: Secondary | ICD-10-CM | POA: Insufficient documentation

## 2017-12-17 DIAGNOSIS — R32 Unspecified urinary incontinence: Secondary | ICD-10-CM | POA: Insufficient documentation

## 2017-12-17 DIAGNOSIS — Z888 Allergy status to other drugs, medicaments and biological substances status: Secondary | ICD-10-CM | POA: Insufficient documentation

## 2017-12-17 DIAGNOSIS — Z880 Allergy status to penicillin: Secondary | ICD-10-CM | POA: Diagnosis not present

## 2017-12-17 DIAGNOSIS — M19042 Primary osteoarthritis, left hand: Secondary | ICD-10-CM | POA: Diagnosis not present

## 2017-12-17 DIAGNOSIS — N183 Chronic kidney disease, stage 3 unspecified: Secondary | ICD-10-CM

## 2017-12-17 DIAGNOSIS — M170 Bilateral primary osteoarthritis of knee: Secondary | ICD-10-CM | POA: Insufficient documentation

## 2017-12-17 DIAGNOSIS — I499 Cardiac arrhythmia, unspecified: Secondary | ICD-10-CM | POA: Diagnosis not present

## 2017-12-17 DIAGNOSIS — W57XXXD Bitten or stung by nonvenomous insect and other nonvenomous arthropods, subsequent encounter: Secondary | ICD-10-CM | POA: Diagnosis not present

## 2017-12-17 LAB — GLUCOSE, POCT (MANUAL RESULT ENTRY): POC GLUCOSE: 151 mg/dL — AB (ref 70–99)
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
## 2017-12-18 LAB — MICROALBUMIN / CREATININE URINE RATIO
CREATININE, UR: 186 mg/dL
MICROALB/CREAT RATIO: 2.3 mg/g{creat} (ref 0.0–30.0)
Microalbumin, Urine: 4.3 ug/mL

## 2017-12-20 ENCOUNTER — Other Ambulatory Visit: Payer: Self-pay | Admitting: Internal Medicine

## 2017-12-20 DIAGNOSIS — E1149 Type 2 diabetes mellitus with other diabetic neurological complication: Secondary | ICD-10-CM

## 2017-12-20 DIAGNOSIS — M2550 Pain in unspecified joint: Secondary | ICD-10-CM

## 2017-12-31 ENCOUNTER — Other Ambulatory Visit: Payer: Self-pay | Admitting: Internal Medicine

## 2017-12-31 DIAGNOSIS — E1149 Type 2 diabetes mellitus with other diabetic neurological complication: Secondary | ICD-10-CM

## 2017-12-31 DIAGNOSIS — M2550 Pain in unspecified joint: Secondary | ICD-10-CM

## 2017-12-31 MED FILL — GABAPENTIN 300 MG CAPSULE: 300 | 30 days supply | Qty: 90 | Fill #2

## 2017-12-31 MED FILL — PRAVASTATIN SODIUM 20 MG TA: 20 | 30 days supply | Qty: 30 | Fill #1

## 2017-12-31 NOTE — Telephone Encounter (Signed)
Patient requested for listed medication to be refilled, traMADol (ULTRAM) 50 MG tablet [[id number]]. Please fu at your earliest convenience.

## 2018-01-08 ENCOUNTER — Telehealth: Payer: Self-pay | Admitting: Internal Medicine

## 2018-01-08 MED FILL — traMADol HCL 50 MG TABS: 50 | 30 days supply | Qty: 90 | Fill #0

## 2018-01-08 NOTE — Telephone Encounter (Signed)
Pt came in to request a medication change on her -gabapentin (NEURONTIN) 300 MG capsule  She wants to know if she needs to take more of this medication or be referred out to a pain clinic to better manage her pain medications. Please follow up

## 2018-01-09 NOTE — Telephone Encounter (Signed)
Good Morning the referral was sent on 11/2017 Dr Elnita Maxwell office will take 4-5 weeks for reviewed the referral   Ulice Dash 'a the phone # [phone]  Thank you

## 2018-01-09 NOTE — Telephone Encounter (Signed)
Patient verified DOB Patient was provided the phone number to Dr. Ella Bodo office [phone]. Patient states the Tramadol was picked up yesterday and she has been using the gabapentin but still experiences a lot of pain.

## 2018-01-09 NOTE — Telephone Encounter (Signed)
Dr. Wynetta Emery you don't have to resubmit referral I will give pt phone number to call and schedule appointment

## 2018-01-09 NOTE — Telephone Encounter (Signed)
Tried contacting pt and pt didn't answer and was unable to lvm

## 2018-01-31 ENCOUNTER — Other Ambulatory Visit: Payer: Self-pay

## 2018-01-31 MED ORDER — PRAVASTATIN SODIUM 20 MG PO TABS: 20.0000 mg | ORAL_TABLET | Freq: Every day | ORAL | 0 refills | Status: DC

## 2018-01-31 MED FILL — PRAVASTATIN SODIUM 20 MG TA: 20 | 90 days supply | Qty: 90 | Fill #0

## 2018-02-14 ENCOUNTER — Other Ambulatory Visit: Payer: Self-pay | Admitting: Internal Medicine

## 2018-02-14 DIAGNOSIS — E1142 Type 2 diabetes mellitus with diabetic polyneuropathy: Secondary | ICD-10-CM

## 2018-02-14 DIAGNOSIS — E1149 Type 2 diabetes mellitus with other diabetic neurological complication: Secondary | ICD-10-CM

## 2018-02-14 DIAGNOSIS — M2550 Pain in unspecified joint: Secondary | ICD-10-CM

## 2018-02-17 MED FILL — traMADol HCL 50 MG TABS: 50 | 30 days supply | Qty: 90 | Fill #0

## 2018-02-19 MED FILL — GABAPENTIN 300 MG CAPSULE: 300 | 30 days supply | Qty: 90 | Fill #0

## 2018-02-19 NOTE — Telephone Encounter (Signed)
Per Lurena Joiner pt picked up rx 02/17/18

## 2018-03-18 ENCOUNTER — Other Ambulatory Visit: Payer: Self-pay | Admitting: Internal Medicine

## 2018-03-18 ENCOUNTER — Encounter: Payer: Self-pay | Admitting: Internal Medicine

## 2018-03-18 ENCOUNTER — Ambulatory Visit: Payer: Medicare HMO | Attending: Internal Medicine | Admitting: Internal Medicine

## 2018-03-18 ENCOUNTER — Telehealth: Payer: Self-pay

## 2018-03-18 VITALS — BP 126/68 | HR 79 | Temp 98.4°F | Resp 16 | Wt 195.0 lb

## 2018-03-18 DIAGNOSIS — Z590 Homelessness: Secondary | ICD-10-CM | POA: Insufficient documentation

## 2018-03-18 DIAGNOSIS — M2550 Pain in unspecified joint: Secondary | ICD-10-CM | POA: Diagnosis not present

## 2018-03-18 DIAGNOSIS — Z880 Allergy status to penicillin: Secondary | ICD-10-CM | POA: Insufficient documentation

## 2018-03-18 DIAGNOSIS — E538 Deficiency of other specified B group vitamins: Secondary | ICD-10-CM | POA: Insufficient documentation

## 2018-03-18 DIAGNOSIS — R413 Other amnesia: Secondary | ICD-10-CM | POA: Diagnosis not present

## 2018-03-18 DIAGNOSIS — H538 Other visual disturbances: Secondary | ICD-10-CM | POA: Diagnosis not present

## 2018-03-18 DIAGNOSIS — W57XXXA Bitten or stung by nonvenomous insect and other nonvenomous arthropods, initial encounter: Secondary | ICD-10-CM | POA: Insufficient documentation

## 2018-03-18 DIAGNOSIS — S20361A Insect bite (nonvenomous) of right front wall of thorax, initial encounter: Secondary | ICD-10-CM | POA: Diagnosis not present

## 2018-03-18 DIAGNOSIS — I129 Hypertensive chronic kidney disease with stage 1 through stage 4 chronic kidney disease, or unspecified chronic kidney disease: Secondary | ICD-10-CM | POA: Insufficient documentation

## 2018-03-18 DIAGNOSIS — E1149 Type 2 diabetes mellitus with other diabetic neurological complication: Secondary | ICD-10-CM | POA: Diagnosis not present

## 2018-03-18 DIAGNOSIS — M545 Low back pain: Secondary | ICD-10-CM | POA: Diagnosis not present

## 2018-03-18 DIAGNOSIS — Z881 Allergy status to other antibiotic agents status: Secondary | ICD-10-CM | POA: Diagnosis not present

## 2018-03-18 DIAGNOSIS — G8929 Other chronic pain: Secondary | ICD-10-CM | POA: Insufficient documentation

## 2018-03-18 DIAGNOSIS — E1140 Type 2 diabetes mellitus with diabetic neuropathy, unspecified: Secondary | ICD-10-CM | POA: Diagnosis not present

## 2018-03-18 DIAGNOSIS — M797 Fibromyalgia: Secondary | ICD-10-CM | POA: Diagnosis not present

## 2018-03-18 DIAGNOSIS — E1122 Type 2 diabetes mellitus with diabetic chronic kidney disease: Secondary | ICD-10-CM | POA: Insufficient documentation

## 2018-03-18 DIAGNOSIS — Z79899 Other long term (current) drug therapy: Secondary | ICD-10-CM | POA: Diagnosis not present

## 2018-03-18 DIAGNOSIS — Z7984 Long term (current) use of oral hypoglycemic drugs: Secondary | ICD-10-CM | POA: Insufficient documentation

## 2018-03-18 DIAGNOSIS — T148XXA Other injury of unspecified body region, initial encounter: Secondary | ICD-10-CM | POA: Insufficient documentation

## 2018-03-18 DIAGNOSIS — Z888 Allergy status to other drugs, medicaments and biological substances status: Secondary | ICD-10-CM | POA: Insufficient documentation

## 2018-03-18 DIAGNOSIS — M1991 Primary osteoarthritis, unspecified site: Secondary | ICD-10-CM | POA: Diagnosis not present

## 2018-03-18 DIAGNOSIS — N183 Chronic kidney disease, stage 3 (moderate): Secondary | ICD-10-CM | POA: Insufficient documentation

## 2018-03-18 LAB — GLUCOSE, POCT (MANUAL RESULT ENTRY): POC Glucose: 136 mg/dl — AB (ref 70–99)

## 2018-03-18 LAB — POCT GLYCOSYLATED HEMOGLOBIN (HGB A1C): HBA1C, POC (CONTROLLED DIABETIC RANGE): 6.9 % (ref 0.0–7.0)

## 2018-03-18 MED ORDER — TRAMADOL HCL 50 MG PO TABS: 50.0000 mg | ORAL_TABLET | Freq: Three times a day (TID) | ORAL | 1 refills | Status: DC | PRN

## 2018-03-18 MED FILL — traMADol HCL 50 MG TABS: 50 | 30 days supply | Qty: 90 | Fill #0

## 2018-03-18 NOTE — Telephone Encounter (Signed)
Pt states her neck has been cracking and popping when she moves her neck. Pt states she was dx with arthritis 5 years ago. Pt states when she gets up she pushes on her arms and she starts to feel a lighting strike feeling going down her b/l arms. Pt is wanting to know the symptoms that she is having are they coming from her neck pain  Pt states she torn her muscle in her right hip 3 years ago. Pt states re injured it that's why she is walking with a walker because she takes the weight off her leg. Pt states she went to the medical sup[facility]ply store to see if they have anything to help support her hip. Pt is wanting to know what do you recommend her doing for her hip pain
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
## 2018-03-18 NOTE — Patient Instructions (Signed)
Try to wear protective clothing when outside.  Please call or follow up if you develop any redness at the sight where tick was removed today.

## 2018-03-19 NOTE — Telephone Encounter (Signed)
Raynelle Fanning would you be able to contact pt to see if she wants to make another appointment with pcp regarding issues

## 2018-03-21 NOTE — Telephone Encounter (Signed)
Called patient no answer. lvm for patient to call back

## 2018-04-04 MED FILL — GABAPENTIN 300 MG CAPSULE: 300 | 30 days supply | Qty: 90 | Fill #1

## 2018-04-16 MED FILL — PRAVASTATIN SODIUM 20 MG TA: 20 | 90 days supply | Qty: 90 | Fill #0

## 2018-05-07 MED FILL — GABAPENTIN 300 MG CAPSULE: 300 | 30 days supply | Qty: 90 | Fill #2

## 2018-05-07 MED FILL — traMADol HCL 50 MG TABS: 50 | 30 days supply | Qty: 90 | Fill #1

## 2018-05-27 ENCOUNTER — Telehealth: Payer: Self-pay | Admitting: Internal Medicine

## 2018-05-27 DIAGNOSIS — M2550 Pain in unspecified joint: Secondary | ICD-10-CM

## 2018-05-27 DIAGNOSIS — E1149 Type 2 diabetes mellitus with other diabetic neurological complication: Secondary | ICD-10-CM

## 2018-05-30 NOTE — Telephone Encounter (Signed)
Contacted pt to make her aware that rx is ready for pickup was unable to lvm

## 2018-05-30 NOTE — Telephone Encounter (Signed)
Pt requesting RF on Tramadol.  1 RF given.  She has appt with me later this mth.

## 2018-06-03 ENCOUNTER — Ambulatory Visit: Payer: Self-pay | Admitting: Critical Care Medicine

## 2018-06-03 MED FILL — traMADol HCL 50 MG TABS: 50 | 30 days supply | Qty: 90 | Fill #0

## 2018-06-03 NOTE — Telephone Encounter (Signed)
Patient wants to know if she can be checked today for a UTI with just a drop off

## 2018-06-23 ENCOUNTER — Ambulatory Visit: Payer: Medicare HMO | Attending: Internal Medicine | Admitting: Internal Medicine

## 2018-06-23 ENCOUNTER — Encounter: Payer: Self-pay | Admitting: Internal Medicine

## 2018-06-23 ENCOUNTER — Ambulatory Visit (HOSPITAL_BASED_OUTPATIENT_CLINIC_OR_DEPARTMENT_OTHER): Payer: Medicare HMO | Admitting: Licensed Clinical Social Worker

## 2018-06-23 ENCOUNTER — Other Ambulatory Visit: Payer: Self-pay | Admitting: Internal Medicine

## 2018-06-23 VITALS — BP 145/77 | HR 86 | Temp 98.5°F | Resp 16 | Wt 209.6 lb

## 2018-06-23 DIAGNOSIS — F331 Major depressive disorder, recurrent, moderate: Secondary | ICD-10-CM

## 2018-06-23 DIAGNOSIS — M2550 Pain in unspecified joint: Secondary | ICD-10-CM

## 2018-06-23 DIAGNOSIS — Z79891 Long term (current) use of opiate analgesic: Secondary | ICD-10-CM | POA: Insufficient documentation

## 2018-06-23 DIAGNOSIS — N3941 Urge incontinence: Secondary | ICD-10-CM | POA: Diagnosis not present

## 2018-06-23 DIAGNOSIS — E6609 Other obesity due to excess calories: Secondary | ICD-10-CM

## 2018-06-23 DIAGNOSIS — E1149 Type 2 diabetes mellitus with other diabetic neurological complication: Secondary | ICD-10-CM

## 2018-06-23 DIAGNOSIS — F419 Anxiety disorder, unspecified: Secondary | ICD-10-CM

## 2018-06-23 DIAGNOSIS — Z590 Homelessness: Secondary | ICD-10-CM | POA: Diagnosis not present

## 2018-06-23 DIAGNOSIS — N182 Chronic kidney disease, stage 2 (mild): Secondary | ICD-10-CM

## 2018-06-23 DIAGNOSIS — Z6833 Body mass index (BMI) 33.0-33.9, adult: Secondary | ICD-10-CM

## 2018-06-23 DIAGNOSIS — E1122 Type 2 diabetes mellitus with diabetic chronic kidney disease: Secondary | ICD-10-CM

## 2018-06-23 DIAGNOSIS — F329 Major depressive disorder, single episode, unspecified: Secondary | ICD-10-CM

## 2018-06-23 DIAGNOSIS — F32A Depression, unspecified: Secondary | ICD-10-CM

## 2018-06-23 DIAGNOSIS — I129 Hypertensive chronic kidney disease with stage 1 through stage 4 chronic kidney disease, or unspecified chronic kidney disease: Secondary | ICD-10-CM | POA: Insufficient documentation

## 2018-06-23 DIAGNOSIS — M797 Fibromyalgia: Secondary | ICD-10-CM | POA: Insufficient documentation

## 2018-06-23 DIAGNOSIS — Z1331 Encounter for screening for depression: Secondary | ICD-10-CM | POA: Insufficient documentation

## 2018-06-23 DIAGNOSIS — E1142 Type 2 diabetes mellitus with diabetic polyneuropathy: Secondary | ICD-10-CM

## 2018-06-23 DIAGNOSIS — N183 Chronic kidney disease, stage 3 (moderate): Secondary | ICD-10-CM | POA: Insufficient documentation

## 2018-06-23 DIAGNOSIS — G8929 Other chronic pain: Secondary | ICD-10-CM

## 2018-06-23 DIAGNOSIS — R8290 Unspecified abnormal findings in urine: Secondary | ICD-10-CM

## 2018-06-23 DIAGNOSIS — M25551 Pain in right hip: Secondary | ICD-10-CM

## 2018-06-23 DIAGNOSIS — Z79899 Other long term (current) drug therapy: Secondary | ICD-10-CM | POA: Insufficient documentation

## 2018-06-23 DIAGNOSIS — R413 Other amnesia: Secondary | ICD-10-CM | POA: Insufficient documentation

## 2018-06-23 DIAGNOSIS — G5603 Carpal tunnel syndrome, bilateral upper limbs: Secondary | ICD-10-CM | POA: Insufficient documentation

## 2018-06-23 LAB — POCT URINALYSIS DIP (CLINITEK)
Bilirubin, UA: NEGATIVE
Blood, UA: NEGATIVE
Glucose, UA: NEGATIVE mg/dL
NITRITE UA: NEGATIVE
PH UA: 5.5 (ref 5.0–8.0)
Spec Grav, UA: 1.025 (ref 1.010–1.025)
Urobilinogen, UA: 0.2 E.U./dL

## 2018-06-23 LAB — GLUCOSE, POCT (MANUAL RESULT ENTRY): POC Glucose: 154 mg/dl — AB (ref 70–99)

## 2018-06-23 LAB — POCT GLYCOSYLATED HEMOGLOBIN (HGB A1C): HBA1C, POC (CONTROLLED DIABETIC RANGE): 7 % (ref 0.0–7.0)

## 2018-06-23 MED ORDER — TRAMADOL HCL 50 MG PO TABS: 50.0000 mg | ORAL_TABLET | Freq: Three times a day (TID) | ORAL | 1 refills | Status: DC | PRN

## 2018-06-23 MED ORDER — TOLTERODINE TARTRATE 1 MG PO TABS: 1.0000 mg | ORAL_TABLET | Freq: Two times a day (BID) | ORAL | 3 refills | Status: DC

## 2018-06-23 MED FILL — TOLTERODINE TARTRATE 1 MG T: 1 | 15 days supply | Qty: 30 | Fill #0

## 2018-06-23 MED FILL — GABAPENTIN 300 MG CAPSULE: 300 | 30 days supply | Qty: 90 | Fill #3

## 2018-06-23 NOTE — Patient Instructions (Addendum)
Start Detrol 1 mg daily to see if it will help with the incontinence.  Your insurance does not pay for Vesicare so we will use the Detrol instead.  Please cut back on eating chocolates.  Try to choose more healthy snacks.
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
## 2018-06-23 NOTE — BH Specialist Note (Signed)
Integrated Behavioral Health Initial Visit  MRN: [id number] Name: Donna Nguyen  Number of [place] Clinician visits:: 1/6 Session Start time: 3:00 PM  Session End time: 3:30 PM Total time: 30 minutes  Type of Service: [place] Interpretor:No. Interpretor Name and Language: N/A   SUBJECTIVE: Donna Nguyen is a 77 y.o. female accompanied by self Patient was referred by Dr. Wynetta Emery for depression and anxiety. Patient reports the following symptoms/concerns: Pt stated that she has been reflecting on the past and has been experiencing increase in symptoms due to current circumstances. She reports loneliness and requests information on support groups Duration of problem: Ongoing; Severity of problem: moderate  OBJECTIVE: Mood: Anxious and Depressed and Affect: Appropriate and Tearful Risk of harm to self or others: No plan to harm self or others  LIFE CONTEXT: Family and Social: Pt resides alone with support animals. She receives limited support from family School/Work: Pt receives social security Self-Care: Pt has been attending support groups weekly through [place]: Pt receives limited support  GOALS ADDRESSED: Patient will: 1. Reduce symptoms of: anxiety and depression 2. Increase knowledge and/or ability of: healthy habits and self-management skills  3. Demonstrate ability to: Increase healthy adjustment to current life circumstances and Increase adequate support systems for patient/family  INTERVENTIONS: Interventions utilized: Solution-Focused Strategies and Supportive Counseling  Standardized Assessments completed: GAD-7 and PHQ 2&9  ASSESSMENT: Patient currently experiencing depression and anxiety triggered by thoughts of the past and current lonliness. Pt has been attending support groups weekly through [employer]. Patient may benefit from medication management and psychotherapy.  She is not interested in medication management at this time.  [place] educated pt on adverse childhood experiences and how they negatively impact mental and physical health as an adult. Pt was appreciative for the information and requested written material to read more about ACES. Additional behavioral health resources were provided.  PLAN: 1. Follow up with behavioral health clinician on : Pt was encouraged to contact LCSWA if symptoms worsen or fail to improve to schedule behavioral appointments at [facility]. 2. Behavioral recommendations: LCSWA recommends that pt apply healthy coping skills discussed and utilize provided resources. Pt is encouraged to schedule follow up appointment with LCSWA 3. Referral(s): [place] (In Clinic) and [place] (LME/Outside Clinic) 4. "From scale of 1-10, how likely are you to follow plan?":   Rebekah [place], LCSW 06/27/18 3:17 PM

## 2018-06-30 MED FILL — traMADol HCL 50 MG TABS: 50 | 30 days supply | Qty: 90 | Fill #0

## 2018-07-07 MED FILL — TOLTERODINE TARTRATE 1 MG T: 1 | 15 days supply | Qty: 30 | Fill #1

## 2018-07-30 ENCOUNTER — Telehealth: Payer: Self-pay | Admitting: Internal Medicine

## 2018-07-30 DIAGNOSIS — E1142 Type 2 diabetes mellitus with diabetic polyneuropathy: Secondary | ICD-10-CM

## 2018-07-30 MED ORDER — GABAPENTIN 300 MG PO CAPS: ORAL_CAPSULE | ORAL | 5 refills | Status: DC

## 2018-07-30 MED FILL — TOLTERODINE TARTRATE 1 MG T: 1 | 15 days supply | Qty: 30 | Fill #2

## 2018-07-30 MED FILL — traMADol HCL 50 MG TABS: 50 | 30 days supply | Qty: 90 | Fill #1

## 2018-07-30 MED FILL — GABAPENTIN 300 MG CAPSULE: 300 | 25 days supply | Qty: 100 | Fill #0

## 2018-07-31 NOTE — Telephone Encounter (Signed)
Contacted pt to go over Dr. Johnson message pt didn't answer and was unable to lvm  

## 2018-08-26 ENCOUNTER — Ambulatory Visit: Payer: Self-pay | Admitting: Internal Medicine

## 2018-09-09 ENCOUNTER — Ambulatory Visit: Payer: Medicare Other | Attending: Internal Medicine | Admitting: Internal Medicine

## 2018-09-09 VITALS — BP 150/55 | HR 88 | Temp 97.9°F | Resp 16 | Ht 64.0 in | Wt 201.6 lb

## 2018-09-09 DIAGNOSIS — N3941 Urge incontinence: Secondary | ICD-10-CM

## 2018-09-09 DIAGNOSIS — M25551 Pain in right hip: Secondary | ICD-10-CM | POA: Diagnosis not present

## 2018-09-09 DIAGNOSIS — E1149 Type 2 diabetes mellitus with other diabetic neurological complication: Secondary | ICD-10-CM

## 2018-09-09 DIAGNOSIS — G8929 Other chronic pain: Secondary | ICD-10-CM

## 2018-09-09 DIAGNOSIS — M159 Polyosteoarthritis, unspecified: Secondary | ICD-10-CM

## 2018-09-09 DIAGNOSIS — R030 Elevated blood-pressure reading, without diagnosis of hypertension: Secondary | ICD-10-CM

## 2018-09-09 DIAGNOSIS — M150 Primary generalized (osteo)arthritis: Secondary | ICD-10-CM

## 2018-09-09 DIAGNOSIS — R1030 Lower abdominal pain, unspecified: Secondary | ICD-10-CM

## 2018-09-09 DIAGNOSIS — L309 Dermatitis, unspecified: Secondary | ICD-10-CM

## 2018-09-09 DIAGNOSIS — F331 Major depressive disorder, recurrent, moderate: Secondary | ICD-10-CM

## 2018-09-09 LAB — GLUCOSE, POCT (MANUAL RESULT ENTRY): POC Glucose: 170 mg/dl — AB (ref 70–99)

## 2018-09-09 MED ORDER — TRAMADOL HCL 50 MG PO TABS: 50.0000 mg | ORAL_TABLET | Freq: Three times a day (TID) | ORAL | 1 refills | Status: DC | PRN

## 2018-09-09 MED ORDER — TRIAMCINOLONE ACETONIDE 0.1 % EX CREA: 1.0000 "application " | TOPICAL_CREAM | Freq: Two times a day (BID) | CUTANEOUS | 0 refills | Status: DC

## 2018-09-09 MED ORDER — TOLTERODINE TARTRATE 1 MG PO TABS: 1.0000 mg | ORAL_TABLET | Freq: Two times a day (BID) | ORAL | 3 refills | Status: DC

## 2018-09-09 MED ORDER — TRAMADOL HCL 50 MG PO TABS: ORAL_TABLET | ORAL | 0 refills | Status: DC

## 2018-09-09 MED ORDER — TRAMADOL HCL 50 MG PO TABS: 50.0000 mg | ORAL_TABLET | Freq: Three times a day (TID) | ORAL | 0 refills | Status: DC | PRN

## 2018-09-09 MED FILL — traMADol HCL 50 MG TABS: 50 | 7 days supply | Qty: 21 | Fill #0

## 2018-09-09 MED FILL — TOLTERODINE TARTRATE 1 MG T: 1 | 15 days supply | Qty: 30 | Fill #3

## 2018-09-09 MED FILL — GABAPENTIN 300 MG CAPSULE: 300 | 25 days supply | Qty: 100 | Fill #1
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
## 2018-09-09 NOTE — Patient Instructions (Signed)
Give patient a 2-week follow-up appointment with [facility] for blood pressure recheck.

## 2018-09-10 DIAGNOSIS — N3941 Urge incontinence: Secondary | ICD-10-CM | POA: Insufficient documentation

## 2018-09-10 DIAGNOSIS — F331 Major depressive disorder, recurrent, moderate: Secondary | ICD-10-CM | POA: Insufficient documentation

## 2018-09-10 DIAGNOSIS — M159 Polyosteoarthritis, unspecified: Secondary | ICD-10-CM | POA: Insufficient documentation

## 2018-09-10 DIAGNOSIS — M150 Primary generalized (osteo)arthritis: Principal | ICD-10-CM

## 2018-09-10 LAB — CBC
Hematocrit: 41 % (ref 34.0–46.6)
Hemoglobin: 13.9 g/dL (ref 11.1–15.9)
MCH: 29.4 pg (ref 26.6–33.0)
MCHC: 33.9 g/dL (ref 31.5–35.7)
MCV: 87 fL (ref 79–97)
Platelets: 389 10*3/uL (ref 150–450)
RBC: 4.73 x10E6/uL (ref 3.77–5.28)
RDW: 13.1 % (ref 11.7–15.4)
WBC: 12.9 10*3/uL — AB (ref 3.4–10.8)

## 2018-09-10 LAB — URINALYSIS
Bilirubin, UA: NEGATIVE
Glucose, UA: NEGATIVE
Nitrite, UA: NEGATIVE
RBC, UA: NEGATIVE
Specific Gravity, UA: 1.027 (ref 1.005–1.030)
Urobilinogen, Ur: 1 mg/dL (ref 0.2–1.0)
pH, UA: 6 (ref 5.0–7.5)

## 2018-09-10 LAB — COMPREHENSIVE METABOLIC PANEL
ALT: 25 IU/L (ref 0–32)
AST: 23 IU/L (ref 0–40)
Albumin/Globulin Ratio: 1.6 (ref 1.2–2.2)
Albumin: 4.4 g/dL (ref 3.7–4.7)
Alkaline Phosphatase: 110 IU/L (ref 39–117)
BUN/Creatinine Ratio: 11 — ABNORMAL LOW (ref 12–28)
BUN: 15 mg/dL (ref 8–27)
Bilirubin Total: 0.2 mg/dL (ref 0.0–1.2)
CALCIUM: 9.7 mg/dL (ref 8.7–10.3)
CO2: 22 mmol/L (ref 20–29)
Chloride: 103 mmol/L (ref 96–106)
Creatinine, Ser: 1.36 mg/dL — ABNORMAL HIGH (ref 0.57–1.00)
GFR calc Af Amer: 44 mL/min/{1.73_m2} — ABNORMAL LOW (ref >59)
GFR calc non Af Amer: 38 mL/min/{1.73_m2} — ABNORMAL LOW (ref >59)
Globulin, Total: 2.7 g/dL (ref 1.5–4.5)
Glucose: 108 mg/dL — ABNORMAL HIGH (ref 65–99)
Potassium: 4.2 mmol/L (ref 3.5–5.2)
Sodium: 142 mmol/L (ref 134–144)
Total Protein: 7.1 g/dL (ref 6.0–8.5)

## 2018-09-10 LAB — LIPID PANEL
CHOL/HDL RATIO: 5.1 ratio — AB (ref 0.0–4.4)
Cholesterol, Total: 268 mg/dL — ABNORMAL HIGH (ref 100–199)
HDL: 53 mg/dL (ref >39)
LDL Calculated: 167 mg/dL — ABNORMAL HIGH (ref 0–99)
Triglycerides: 241 mg/dL — ABNORMAL HIGH (ref 0–149)
VLDL Cholesterol Cal: 48 mg/dL — ABNORMAL HIGH (ref 5–40)

## 2018-09-12 ENCOUNTER — Telehealth: Payer: Self-pay

## 2018-09-12 NOTE — Telephone Encounter (Signed)
Contacted pt to go over lab results pt didn't answer and was unable to lvm will try pt again another time

## 2018-09-23 ENCOUNTER — Ambulatory Visit: Payer: Medicare Other | Attending: Family Medicine | Admitting: Pharmacist

## 2018-09-23 ENCOUNTER — Encounter: Payer: Self-pay | Admitting: Pharmacist

## 2018-09-23 VITALS — BP 146/74 | HR 88

## 2018-09-23 DIAGNOSIS — R030 Elevated blood-pressure reading, without diagnosis of hypertension: Secondary | ICD-10-CM | POA: Insufficient documentation

## 2018-09-23 DIAGNOSIS — E1122 Type 2 diabetes mellitus with diabetic chronic kidney disease: Secondary | ICD-10-CM | POA: Insufficient documentation

## 2018-09-23 DIAGNOSIS — N189 Chronic kidney disease, unspecified: Secondary | ICD-10-CM | POA: Insufficient documentation

## 2018-09-23 MED ORDER — LISINOPRIL 10 MG PO TABS: 10.0000 mg | ORAL_TABLET | Freq: Every day | ORAL | 2 refills | Status: DC

## 2018-09-23 MED FILL — LISINOPRIL 10 MG TABS: 10 | 30 days supply | Qty: 30 | Fill #0

## 2018-09-23 MED FILL — traMADol HCL 50 MG TABS: 50 | 7 days supply | Qty: 21 | Fill #1

## 2018-09-23 MED FILL — TOLTERODINE TARTRATE 1 MG T: 1 | 15 days supply | Qty: 30 | Fill #0

## 2018-09-23 NOTE — Progress Notes (Signed)
   S:    PCP: Dr. Wynetta Emery  Patient arrives in good spirits. Presents to the clinic for hypertension evaluation, counseling, and management. Patient was referred by Dr. Wynetta Emery on 09/09/18. BP 150/55 - no medications were started. Pt opted to follow-up with me to reassess need for medications.   No chest pain, shortness of breath, HA or blurred vision.   Current BP Medications include: none currently   Antihypertensives tried in the past include: - NKDA to antihypertensives - HCTZ 25 mg daily (pt d/c'd after confusing this for pain medicine) - lisinopril (d/c'd by doctor per pt request after BP was controlled without)  Dietary habits include:  - limits salt - drinks 2 diet Cheerwine Exercise habits include: - does not exercise due to pain Family / Social history:  - Tobacco: never smoker - Alcohol: drinks 2-3 drinks/week  Home BP readings: reports SBPs 150s   O:  L arm after 5 minutes rest: 146/74, HR 88 Last 3 Office BP readings: BP Readings from Last 3 Encounters:  09/09/18 (!) 150/55  06/23/18 (!) 145/77  03/18/18 126/68   BMET    Component Value Date/Time   NA 142 09/09/2018 1655   K 4.2 09/09/2018 1655   CL 103 09/09/2018 1655   CO2 22 09/09/2018 1655   GLUCOSE 108 (H) 09/09/2018 1655   GLUCOSE 118 (H) 06/20/2016 1020   BUN 15 09/09/2018 1655   CREATININE 1.36 (H) 09/09/2018 1655   CREATININE 1.40 (H) 06/20/2016 1020   CALCIUM 9.7 09/09/2018 1655   GFRNONAA 38 (L) 09/09/2018 1655   GFRAA 44 (L) 09/09/2018 1655   Renal function: Estimated Creatinine Clearance: 38.6 mL/min (A) (by C-G formula based on SCr of 1.36 mg/dL (H)).  Clinical ASCVD: No  - ASCVD risk score: 38.8%   A/P: Hypertension longstanding currently uncontrolled on current medications. BP Goal <130/80 mmHg. Patient does not currently take medications for BP control. With patient's concurrent CKD and DM, will have her start lisinopril. Also, pt has tried lisinopril in the past and tolerates it  well.  -Started lisinopril 10 mg daily.  -CMP14 +GFR future -Counseled on lifestyle modifications for blood pressure control including reduced dietary sodium, increased exercise, adequate sleep -HM: deferred PNA, tetanus vaccines at previous visit; continues to defer  Results reviewed and written information provided. Total time in face-to-face counseling 30 minutes.   F/U Clinic Visit 11/10/2018.    Patient seen with: Beckey Rutter, PharmD Candidate  [employer]  Class of 2022  Benard Halsted, PharmD, [place] [phone]

## 2018-09-23 NOTE — Patient Instructions (Signed)
Thank you for coming to see [place] today.   Blood pressure today is elevated.  Start taking lisinopril once daily as prescribed.   Limiting salt and caffeine, as well as exercising as able for at least 30 minutes for 5 days out of the week, can also help you lower your blood pressure.  Take your blood pressure at home if you are able. Please write down these numbers and bring them to your visits.  If you have any questions about medications, please call me [phone].  Lurena Joiner

## 2018-10-01 ENCOUNTER — Telehealth: Payer: Self-pay | Admitting: Internal Medicine

## 2018-10-02 ENCOUNTER — Telehealth: Payer: Self-pay | Admitting: Internal Medicine

## 2018-10-02 NOTE — Telephone Encounter (Signed)
Called Pt to reschedule her appointment. She would like her nurse call her back in regards to her -traMADol (ULTRAM) 50 MG tablet  She states she is taking two tablets daily and if afraid she will run out before she can have her refill. Please call her back when possible (Pt has found all her missing medications)

## 2018-10-02 NOTE — Telephone Encounter (Signed)
Margaret could you contact pt  

## 2018-10-03 MED ORDER — SOLIFENACIN SUCCINATE 5 MG PO TABS: 5.0000 mg | ORAL_TABLET | Freq: Every day | ORAL | 2 refills | Status: DC

## 2018-10-03 MED FILL — GABAPENTIN 300 MG CAPSULE: 300 | 25 days supply | Qty: 100 | Fill #2

## 2018-10-03 MED FILL — traMADol HCL 50 MG TABS: 50 | 16 days supply | Qty: 48 | Fill #2

## 2018-10-03 NOTE — Telephone Encounter (Signed)
Donna Nguyen is ok with a change to Vesicare as long as you believe it is in her best interest. She would like it sent to [facility].  She was also told to contact the office if she had any issues with the new medication.

## 2018-10-03 NOTE — Telephone Encounter (Signed)
Detrol changed to [employer].

## 2018-10-06 MED FILL — SOLIFENACIN SUCCINATE 5 MG: 5 | 30 days supply | Qty: 30 | Fill #0

## 2018-10-08 ENCOUNTER — Telehealth: Payer: Self-pay | Admitting: Internal Medicine

## 2018-10-08 DIAGNOSIS — R030 Elevated blood-pressure reading, without diagnosis of hypertension: Secondary | ICD-10-CM

## 2018-10-08 NOTE — Telephone Encounter (Signed)
New Message   pt came in states that she is having some side effects with the blood pressure medication she is taking and that her bp was 117/100. Please f/u

## 2018-10-10 MED ORDER — LISINOPRIL 5 MG PO TABS: 5.0000 mg | ORAL_TABLET | Freq: Every day | ORAL | 2 refills | Status: DC

## 2018-10-10 MED FILL — LISINOPRIL 5 MG TAB: 5 | 30 days supply | Qty: 30 | Fill #0

## 2018-10-10 NOTE — Telephone Encounter (Signed)
Clarified with the patient that her BP was 117/70, not 117/100. She reports feeling dizziness with the 10 mg dose of lisinopril that I initiated at our BP-check appointment (09/23/18). She expresses desire to decrease to 5 mg daily. I will send in an updated rx and inform her PCP of this.   Of note, she reports having to take extra doses of gabapentin for pain control. Will make her PCP aware.

## 2018-11-05 MED FILL — SOLIFENACIN SUCCINATE 5 MG: 5 | 30 days supply | Qty: 30 | Fill #1

## 2018-11-05 MED FILL — LISINOPRIL 5 MG TAB: 5 | 30 days supply | Qty: 30 | Fill #1

## 2018-11-05 MED FILL — GABAPENTIN 300 MG CAPSULE: 300 | 25 days supply | Qty: 100 | Fill #3

## 2018-11-05 MED FILL — traMADol HCL 50 MG TABS: 50 | 30 days supply | Qty: 90 | Fill #0

## 2018-11-10 ENCOUNTER — Ambulatory Visit: Payer: Self-pay | Admitting: Internal Medicine

## 2018-11-17 ENCOUNTER — Other Ambulatory Visit: Payer: Self-pay

## 2018-11-17 ENCOUNTER — Ambulatory Visit: Payer: Medicare Other | Attending: Internal Medicine | Admitting: Internal Medicine

## 2018-11-17 DIAGNOSIS — Z9181 History of falling: Secondary | ICD-10-CM | POA: Diagnosis not present

## 2018-11-17 DIAGNOSIS — M150 Primary generalized (osteo)arthritis: Secondary | ICD-10-CM

## 2018-11-17 DIAGNOSIS — M159 Polyosteoarthritis, unspecified: Secondary | ICD-10-CM

## 2018-11-17 DIAGNOSIS — M797 Fibromyalgia: Secondary | ICD-10-CM

## 2018-11-17 DIAGNOSIS — M25512 Pain in left shoulder: Secondary | ICD-10-CM

## 2018-11-17 DIAGNOSIS — I10 Essential (primary) hypertension: Secondary | ICD-10-CM

## 2018-11-17 NOTE — Progress Notes (Signed)
Virtual Visit via Telephone Note  I connected with Donna Nguyen on 11/17/18 at  2:10 PM EDT by telephone and verified that I am speaking with the correct person using two identifiers.  Pt is at home.  This conversation did not have any other participants other than the patient and myself.   I discussed the limitations, risks, security and privacy concerns of performing an evaluation and management service by telephone and the availability of in person appointments. I also discussed with the patient that there may be a patient responsible charge related to this service. The patient expressed understanding and agreed to proceed.   History of Present Illness: Patient with history of HTN,DMwith neuropathy,CKD 3,HL, homelessness and chronic LBPon tramadol and gabapentin, OA of RT hand and knees, possible fibromyalgia   Patient's main complaint today is generalized body pains in her joints and muscles.  She feels that it has been worse in rainy weather.  She has noted a little swelling in the ankle jts  She has known OA in the right ankle based on imaging done in 2016.  Also complaining of pain in the left shoulder which is also intermittent but has not been as bad over the past week.  She fell 1 week ago while trying to walk in the dark on the porch where she lives.  She slipped on something and landed on the left side on a "sack of stuff."  She does not feel that anything is broken.  She just feels sore.  She does not use her walker at home because she does not have enough room on the porch where she lives.  However she uses it whenever she leaves her dwelling to go get food. -Reports some relief of her pain with tramadol and also with gabapentin.  She takes an extra Gabapentin about 4 times a month.  I have given her an extra 10 to use throughout the month -She is wanting a refill on tramadol and gabapentin.  She gets her medications through our pharmacy.  I have informed her that due to COVID-19  our pharmacy is mailing prescriptions to our patients to a home address.  Pharmacy cannot use PO Box and she currently has a P[address]O Box.  Patient plans to confer with her brother to see if she can use his home address and will call us back to let us know so that we can send her medications there  HTN:  Saw clinical pharmacist since last visit with me for recheck on blood pressure.  Lisinopril 10 mg prescribed but she did not tolerate this dose.  The dose was subsequently decreased to 5 mg which she is tolerating well.   Observations/Objective: No direct observation on physical exam done as this was a telephone visit  Assessment and Plan: 1. Primary osteoarthritis involving multiple joints 2. Fibromyalgia 3. History of fall 4. Acute pain of left shoulder Patient on tramadol and gabapentin which helps.  We discussed the fact that she will never be 100% pain-free but we do want to control it to the extent to allow her to be functional.  Discussed options of referring her for some physical therapy, to orthopedics, pain specialist or a combination of these.  Patient does not want to do physical therapy at this time stating that she has a lot of unpacking of boxes at the place where she is currently staying that keeps her busy during the day.  She unpacks for an hour or so and then has to  rest for about an hour. -She also declines referral to orthopedics or pain specialist stating that she does not want injections or more medications. -I reminded her that I have prescribed 10 additional pills of gabapentin for her to take during the month.  On average she has been taking the extra only about 4 times a month. Encouraged her to use her walker when she is out in public and the cane at home  Home -Encouraged her to make sure her path is well lit and avoid walking in the dark as this too contributes falls -When she calls [place] back with a home address for her brother I can send refills on her medications to be  mailed out to her -Offered patient the option of coming in to be seen in the office but patient declined stating she is afraid to due to the COVID-19 outbreak  5. Essential hypertension Continue Lisinopril 5 mg   Follow Up Instructions: Patient advised to follow-up with me when she can and if symptoms worsen.    I discussed the assessment and treatment plan with the patient. The patient was provided an opportunity to ask questions and all were answered. The patient agreed with the plan and demonstrated an understanding of the instructions.   The patient was advised to call back or seek an in-person evaluation if the symptoms worsen or if the condition fails to improve as anticipated.  I provided 23 minutes of non-face-to-face time during this encounter.   Karle Plumber, MD

## 2018-11-17 NOTE — Progress Notes (Signed)
Pt states since she seen [place] last her pain has increased.  Pt states she is having pain all over her body   Pt states when she leaned over the right side when she put pressure on her right hand she felt a sharpe sensation

## 2018-11-18 ENCOUNTER — Telehealth: Payer: Self-pay | Admitting: Internal Medicine

## 2018-11-18 NOTE — Telephone Encounter (Signed)
Pt called to update address to deliver  [facility] [address] [place] [phone]

## 2018-11-19 NOTE — Telephone Encounter (Signed)
Pharmacy notified.

## 2018-11-24 ENCOUNTER — Other Ambulatory Visit: Payer: Self-pay | Admitting: Internal Medicine

## 2018-11-24 MED FILL — GABAPENTIN 300 MG CAPSULE: 300 | 25 days supply | Qty: 100 | Fill #4

## 2018-11-24 MED FILL — SOLIFENACIN SUCCINATE 5 MG: 5 | 30 days supply | Qty: 30 | Fill #2

## 2018-11-24 MED FILL — LISINOPRIL 5 MG TAB: 5 | 30 days supply | Qty: 30 | Fill #2

## 2018-11-25 NOTE — Telephone Encounter (Signed)
Yes ma'am pt gets tramadol filled at [facility]

## 2018-12-01 ENCOUNTER — Other Ambulatory Visit: Payer: Self-pay | Admitting: Internal Medicine

## 2018-12-03 ENCOUNTER — Other Ambulatory Visit: Payer: Self-pay

## 2018-12-03 MED ORDER — TRAMADOL HCL 50 MG PO TABS: 50.0000 mg | ORAL_TABLET | Freq: Three times a day (TID) | ORAL | 0 refills | Status: DC | PRN

## 2018-12-03 NOTE — Telephone Encounter (Signed)
Check in medication book no rx was written in. I had Luke check RX30 no rx in their system and he look into the controlled database.   Would need another rx.  Will print it off and put in pcp office for signature

## 2018-12-04 MED FILL — traMADol HCL 50 MG TABS: 50 | 30 days supply | Qty: 90 | Fill #0

## 2018-12-04 NOTE — Telephone Encounter (Signed)
RX given to [facility]

## 2018-12-17 ENCOUNTER — Telehealth: Payer: Self-pay | Admitting: Internal Medicine

## 2018-12-18 NOTE — Telephone Encounter (Signed)
Contacted pt to go over Dr. Wynetta Emery message pt doesn't have any questions or concerns

## 2019-01-30 MED FILL — GABAPENTIN 300 MG CAPSULE: 300 | 25 days supply | Qty: 100 | Fill #5

## 2019-02-02 ENCOUNTER — Other Ambulatory Visit: Payer: Self-pay | Admitting: Internal Medicine

## 2019-02-02 DIAGNOSIS — R030 Elevated blood-pressure reading, without diagnosis of hypertension: Secondary | ICD-10-CM

## 2019-02-02 DIAGNOSIS — E1142 Type 2 diabetes mellitus with diabetic polyneuropathy: Secondary | ICD-10-CM

## 2019-02-02 MED ORDER — GABAPENTIN 300 MG PO CAPS: ORAL_CAPSULE | ORAL | 5 refills | Status: DC

## 2019-02-02 MED ORDER — LISINOPRIL 5 MG PO TABS: 5.0000 mg | ORAL_TABLET | Freq: Every day | ORAL | 2 refills | Status: DC

## 2019-02-02 MED ORDER — SOLIFENACIN SUCCINATE 5 MG PO TABS: 5.0000 mg | ORAL_TABLET | Freq: Every day | ORAL | 2 refills | Status: DC

## 2019-02-02 MED ORDER — TRAMADOL HCL 50 MG PO TABS: 50.0000 mg | ORAL_TABLET | Freq: Three times a day (TID) | ORAL | 0 refills | Status: DC | PRN

## 2019-02-02 NOTE — Telephone Encounter (Signed)
1) Medication(s) Requested (by name): Tramadol Gabapentin Lisinopril  Bladder medication (doesn't know the name)   2) Pharmacy of Choice:  [facility]

## 2019-02-03 MED FILL — LISINOPRIL 5 MG TAB: 5 | 30 days supply | Qty: 30 | Fill #0

## 2019-02-03 MED FILL — traMADol HCL 50 MG TABS: 50 | 30 days supply | Qty: 90 | Fill #0

## 2019-02-03 MED FILL — SOLIFENACIN SUCCINATE 5 MG: 5 | 30 days supply | Qty: 30 | Fill #0

## 2019-02-03 NOTE — Telephone Encounter (Signed)
Contacted pt and made aware that rx is ready for pickup at the front desk

## 2019-03-05 MED FILL — traMADol HCL 50 MG TABS: 50 | 30 days supply | Qty: 90 | Fill #0

## 2019-03-10 MED FILL — SOLIFENACIN SUCCINATE 5 MG: 5 | 30 days supply | Qty: 30 | Fill #1

## 2019-03-10 MED FILL — GABAPENTIN 300 MG CAPSULE: 300 | 30 days supply | Qty: 120 | Fill #0

## 2019-03-10 MED FILL — LISINOPRIL 5 MG TAB: 5 | 30 days supply | Qty: 30 | Fill #1

## 2019-04-15 ENCOUNTER — Other Ambulatory Visit: Payer: Self-pay | Admitting: Internal Medicine

## 2019-04-15 MED FILL — GABAPENTIN 300 MG CAPSULE: 300 | 30 days supply | Qty: 120 | Fill #1

## 2019-04-15 MED FILL — LISINOPRIL 5 MG TAB: 5 | 30 days supply | Qty: 30 | Fill #2

## 2019-04-15 MED FILL — SOLIFENACIN SUCCINATE 5 MG: 5 | 30 days supply | Qty: 30 | Fill #2

## 2019-04-16 NOTE — Telephone Encounter (Signed)
Pt last seen in clinic 11/17/18. No follow-ups on file.   PMP reviewed. Pt last had tramadol filled 03/05/19 written for 30-day supply. Will route to PCP for approval.

## 2019-04-17 NOTE — Telephone Encounter (Signed)
Patient came  Requesting Tramadol  . Please, call her when is ready to pick up

## 2019-04-20 MED FILL — traMADol HCL 50 MG TABS: 50 | 30 days supply | Qty: 90 | Fill #0

## 2019-05-19 ENCOUNTER — Other Ambulatory Visit: Payer: Self-pay | Admitting: Internal Medicine

## 2019-05-19 DIAGNOSIS — R030 Elevated blood-pressure reading, without diagnosis of hypertension: Secondary | ICD-10-CM

## 2019-05-19 MED FILL — SOLIFENACIN SUCCINATE 5 MG: 5 | 30 days supply | Qty: 30 | Fill #0

## 2019-05-19 MED FILL — GABAPENTIN 300 MG CAPSULE: 300 | 30 days supply | Qty: 120 | Fill #2

## 2019-05-20 ENCOUNTER — Other Ambulatory Visit: Payer: Self-pay | Admitting: Internal Medicine

## 2019-05-20 DIAGNOSIS — R030 Elevated blood-pressure reading, without diagnosis of hypertension: Secondary | ICD-10-CM

## 2019-06-10 ENCOUNTER — Other Ambulatory Visit: Payer: Self-pay | Admitting: Internal Medicine

## 2019-06-11 MED FILL — SOLIFENACIN SUCCINATE 5 MG: 5 | 30 days supply | Qty: 30 | Fill #1

## 2019-06-15 ENCOUNTER — Telehealth: Payer: Self-pay | Admitting: Internal Medicine

## 2019-06-15 DIAGNOSIS — R030 Elevated blood-pressure reading, without diagnosis of hypertension: Secondary | ICD-10-CM

## 2019-06-15 MED ORDER — TRAMADOL HCL 50 MG PO TABS: 50.0000 mg | ORAL_TABLET | Freq: Three times a day (TID) | ORAL | 0 refills | Status: DC | PRN

## 2019-06-15 MED ORDER — LISINOPRIL 5 MG PO TABS: 5.0000 mg | ORAL_TABLET | Freq: Every day | ORAL | 2 refills | Status: DC

## 2019-06-15 MED FILL — GABAPENTIN 300 MG CAPSULE: 300 | 30 days supply | Qty: 120 | Fill #3

## 2019-06-15 NOTE — Telephone Encounter (Signed)
1) Medication(s) Requested (by name):  Lisinopril  -states he bp reading yesterday was 168/80 with pulse of 80  Tramadol  -states she is out of this medication completely   2) Pharmacy of Choice:  [facility]

## 2019-06-16 MED FILL — traMADol HCL 50 MG TABS: 50 | 30 days supply | Qty: 90 | Fill #0

## 2019-06-16 MED FILL — LISINOPRIL 5 MG TABLET: 5 | 30 days supply | Qty: 30 | Fill #0

## 2019-06-16 NOTE — Telephone Encounter (Signed)
Contacted pt to go over Dr. Wynetta Emery message pt didn't answer and was unable to lvm due to vm not being set up

## 2019-06-17 ENCOUNTER — Other Ambulatory Visit: Payer: Self-pay | Admitting: Pharmacist

## 2019-06-17 DIAGNOSIS — R030 Elevated blood-pressure reading, without diagnosis of hypertension: Secondary | ICD-10-CM

## 2019-06-17 MED ORDER — LISINOPRIL 5 MG PO TABS: 5.0000 mg | ORAL_TABLET | Freq: Every day | ORAL | 0 refills | Status: DC

## 2019-06-18 ENCOUNTER — Ambulatory Visit: Payer: Medicare Other

## 2019-07-02 ENCOUNTER — Ambulatory Visit: Payer: Medicare Other | Admitting: Internal Medicine

## 2019-07-10 ENCOUNTER — Other Ambulatory Visit: Payer: Self-pay | Admitting: Internal Medicine

## 2019-07-10 ENCOUNTER — Telehealth: Payer: Self-pay

## 2019-07-10 MED FILL — GABAPENTIN 300 MG CAPSULE: 300 | 30 days supply | Qty: 120 | Fill #4

## 2019-07-10 MED FILL — SOLIFENACIN SUCCINATE 5 MG: 5 | 30 days supply | Qty: 30 | Fill #2

## 2019-07-10 MED FILL — LISINOPRIL 5 MG TABLET: 5 | 90 days supply | Qty: 90 | Fill #0

## 2019-07-10 NOTE — Telephone Encounter (Signed)
Pt. Called stated she has been urinating a lot at night.  Pt. Stated if she cough, yell at her dog, or sneeze she stated she would urinate on herself with no control.  Pt. Ask PCP if the medication Solifenacin need to be increase.  Pt. Mentioned she also lost her pet dog and do not know if that can also cause it.

## 2019-07-13 NOTE — Telephone Encounter (Signed)
Will forward to pcp

## 2019-07-14 NOTE — Telephone Encounter (Signed)
Contacted pt to go over Dr. Johnson message pt is aware and doesn't have any questions or concerns  

## 2019-07-15 ENCOUNTER — Other Ambulatory Visit: Payer: Self-pay | Admitting: Internal Medicine

## 2019-07-15 MED FILL — traMADol HCL 50 MG TABS: 50 | 30 days supply | Qty: 90 | Fill #0

## 2019-07-23 ENCOUNTER — Ambulatory Visit: Payer: Medicare Other | Attending: Internal Medicine | Admitting: Internal Medicine

## 2019-07-23 ENCOUNTER — Other Ambulatory Visit: Payer: Self-pay

## 2019-07-23 DIAGNOSIS — I10 Essential (primary) hypertension: Secondary | ICD-10-CM

## 2019-07-23 DIAGNOSIS — J988 Other specified respiratory disorders: Secondary | ICD-10-CM | POA: Diagnosis not present

## 2019-07-23 DIAGNOSIS — M8949 Other hypertrophic osteoarthropathy, multiple sites: Secondary | ICD-10-CM | POA: Diagnosis not present

## 2019-07-23 DIAGNOSIS — Z2821 Immunization not carried out because of patient refusal: Secondary | ICD-10-CM

## 2019-07-23 DIAGNOSIS — B9789 Other viral agents as the cause of diseases classified elsewhere: Secondary | ICD-10-CM

## 2019-07-23 DIAGNOSIS — G8929 Other chronic pain: Secondary | ICD-10-CM

## 2019-07-23 DIAGNOSIS — E1149 Type 2 diabetes mellitus with other diabetic neurological complication: Secondary | ICD-10-CM

## 2019-07-23 DIAGNOSIS — M159 Polyosteoarthritis, unspecified: Secondary | ICD-10-CM

## 2019-07-23 DIAGNOSIS — M25551 Pain in right hip: Secondary | ICD-10-CM

## 2019-07-23 DIAGNOSIS — E538 Deficiency of other specified B group vitamins: Secondary | ICD-10-CM

## 2019-07-23 NOTE — Progress Notes (Signed)
Virtual Visit via Telephone Note Due to current restrictions/limitations of in-office visits due to the COVID-19 pandemic, this scheduled clinical appointment was converted to a telehealth visit  I connected with Donna Nguyen on 07/23/19 at 5:10 p.m by telephone and verified that I am speaking with the correct person using two identifiers. I am in my office.  The patient is at home.  Only the patient and myself participated in this encounter.  I discussed the limitations, risks, security and privacy concerns of performing an evaluation and management service by telephone and the availability of in person appointments. I also discussed with the patient that there may be a patient responsible charge related to this service. The patient expressed understanding and agreed to proceed.   History of Present Illness: Patient with history of HTN,DMwith neuropathy,CKD 3,HL, homelessness and chronic LBPon tramadol and gabapentin, OA of RT hand and knees, possible fibromyalgia.  I last did televisit 10/2018  Was without heat for 3 wks until yesterday.  Thinks she had flu that started 3 wks ago. Had body aches, pain, fever, HA, congestion, cough and throat irritation (stinging/burning).  Fever resolve.  Congestion and cough much better but throat irritation remains.  No SOB  RT Leg/hip not getting better.  Started after she fell off ladder in 2016 and pulled muscle around hip.  Seen in ER at the time and had x-ray which showed severe OA hip and moderate OA on LT side Has gone to Intergrative therapy several times in the past and found her services helpful in terms of pain management.  She would like a referral to them.  She continues to take gabapentin and tramadol which she also finds helpful. Walks with 2 cane.  No recent falls  HTN: compliant with Lisinopril.  Limits salt in foods  Out of Vit D and Vit B12.  Plans to pick up at [facility] tomorrow  Diabetes: Diet controlled.  She does not check blood  sugars. Outpatient Encounter Medications as of 07/23/2019  Medication Sig  . cholecalciferol (VITAMIN D) 1000 units tablet Take 1 tablet (1,000 Units total) by mouth daily.  Marland Kitchen gabapentin (NEURONTIN) 300 MG capsule 1 cap PO TID.  May take an extra dose once a day PRN  . lisinopril (ZESTRIL) 5 MG tablet Take 1 tablet (5 mg total) by mouth daily.  . NYSTATIN, TOPICAL, POWD Apply topically.  . pravastatin (PRAVACHOL) 20 MG tablet TAKE 1 TABLET BY MOUTH DAILY.  Marland Kitchen solifenacin (VESICARE) 5 MG tablet Take 1 tablet (5 mg total) by mouth daily.  . traMADol (ULTRAM) 50 MG tablet TAKE 1 TABLET (50 MG TOTAL) BY MOUTH EVERY 8 (EIGHT) HOURS AS NEEDED.  Marland Kitchen triamcinolone cream (KENALOG) 0.1 % Apply 1 application topically 2 (two) times daily.  . vitamin B-12 (CYANOCOBALAMIN) 500 MCG tablet Take 1 tablet (500 mcg total) by mouth 2 (two) times daily. (Patient not taking: Reported on 09/18/2017)  . Vitamin D, Ergocalciferol, (DRISDOL) 50000 units CAPS capsule Take 1 capsule (50,000 Units total) by mouth every 7 (seven) days. (Patient not taking: Reported on 05/31/2017)   No facility-administered encounter medications on file as of 07/23/2019.     Observations/Objective: No direct observation done  Assessment and Plan: 1. Viral respiratory illness Patient sounds as though she is on the tail end of a viral illness that could have been the flu or Covid.  I recommend getting Covid testing done at our [facility].  She states she will consider doing that.  I recommend diabetic toxin  over-the-counter to use as needed for the cough.  If any shortness of breath she should be seen in the emergency room.  2. Primary osteoarthritis involving multiple joints Reports benefit from tramadol.  She denies any drowsiness with the medication.  She will continue using the medicine as needed.  3. Essential hypertension Continue lisinopril and low-salt diet - CBC; Future - Comprehensive metabolic panel; Future  4. Chronic  pain of right hip We will refer to integrative therapies per her request - Ambulatory referral to Pain Clinic  5. Vitamin B12 deficiency Advised to purchase vitamin B 12 supplement over-the-counter and continue taking.  She also should purchase vitamin D 1000 IU and take daily.  6. Type 2 diabetes mellitus with neurological manifestations ([place]) - Lipid panel; Future - Hemoglobin A1c; Future - Microalbumin / creatinine urine ratio; Future  7.  Declined flu shot.  Follow Up Instructions:    I discussed the assessment and treatment plan with the patient. The patient was provided an opportunity to ask questions and all were answered. The patient agreed with the plan and demonstrated an understanding of the instructions.   The patient was advised to call back or seek an in-person evaluation if the symptoms worsen or if the condition fails to improve as anticipated.  I provided 19 minutes of non-face-to-face time during this encounter.   Karle Plumber, MD

## 2019-07-28 ENCOUNTER — Other Ambulatory Visit: Payer: Self-pay

## 2019-07-28 ENCOUNTER — Ambulatory Visit: Payer: Medicare Other | Admitting: Pharmacist

## 2019-07-28 DIAGNOSIS — Z20822 Contact with and (suspected) exposure to covid-19: Secondary | ICD-10-CM

## 2019-07-29 ENCOUNTER — Other Ambulatory Visit: Payer: Medicare Other

## 2019-07-30 LAB — NOVEL CORONAVIRUS, NAA: SARS-CoV-2, NAA: NOT DETECTED

## 2019-08-11 ENCOUNTER — Other Ambulatory Visit: Payer: Self-pay | Admitting: Internal Medicine

## 2019-08-11 ENCOUNTER — Other Ambulatory Visit: Payer: Self-pay | Admitting: Pharmacist

## 2019-08-11 DIAGNOSIS — R030 Elevated blood-pressure reading, without diagnosis of hypertension: Secondary | ICD-10-CM

## 2019-08-11 DIAGNOSIS — E1142 Type 2 diabetes mellitus with diabetic polyneuropathy: Secondary | ICD-10-CM

## 2019-08-11 MED ORDER — LISINOPRIL 5 MG PO TABS: 5.0000 mg | ORAL_TABLET | Freq: Every day | ORAL | 0 refills | Status: DC

## 2019-08-11 MED ORDER — SOLIFENACIN SUCCINATE 5 MG PO TABS: 5.0000 mg | ORAL_TABLET | Freq: Every day | ORAL | 2 refills | Status: DC

## 2019-08-11 MED FILL — GABAPENTIN 300 MG CAPSULE: 300 | 30 days supply | Qty: 120 | Fill #0

## 2019-08-11 MED FILL — SOLIFENACIN SUCCINATE 5 MG: 5 | 30 days supply | Qty: 30 | Fill #0

## 2019-08-11 MED FILL — traMADol HCL 50 MG TABS: 50 | 30 days supply | Qty: 90 | Fill #0

## 2019-10-02 ENCOUNTER — Encounter: Payer: Self-pay | Admitting: *Deleted

## 2019-10-02 ENCOUNTER — Telehealth: Payer: Self-pay | Admitting: Internal Medicine

## 2019-10-02 NOTE — Telephone Encounter (Signed)
Patient called and requested some info from pcp. Patient stated that the solifenacin seems to have effected her vision. She complained of having blurred vision currently. Patient stated that she saw on the TV that there is something new that you can put on your phone and by placing the phone on your wrist, it will check your blood sugar. Patient stated she had the name of the app/device written at home and she would call to inform once she got home. Please fu at your earliest convenience.
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
## 2019-10-06 NOTE — Telephone Encounter (Signed)
Will forward to pcp

## 2019-10-07 NOTE — Telephone Encounter (Signed)
Tried contacting pt to go over Dr. Wynetta Emery message was unable to reach pt or lvm

## 2019-10-22 MED FILL — LISINOPRIL 5 MG TABLET: 5 | 90 days supply | Qty: 90 | Fill #0

## 2019-10-26 ENCOUNTER — Telehealth: Payer: Self-pay | Admitting: Internal Medicine

## 2019-10-26 MED FILL — GABAPENTIN 300 MG CAPSULE: 300 | 30 days supply | Qty: 120 | Fill #1

## 2019-10-28 MED FILL — traMADol HCL 50 MG TABS: 50 | 30 days supply | Qty: 90 | Fill #0

## 2019-10-28 NOTE — Telephone Encounter (Signed)
Patient called and requested for a refill on her tramadol. Patient stated that she does not want this to run out.

## 2019-10-28 NOTE — Telephone Encounter (Signed)
Will forward to pcp

## 2019-10-30 ENCOUNTER — Ambulatory Visit: Payer: Medicare Other | Admitting: Internal Medicine

## 2019-11-26 ENCOUNTER — Telehealth: Payer: Self-pay

## 2019-11-26 DIAGNOSIS — R030 Elevated blood-pressure reading, without diagnosis of hypertension: Secondary | ICD-10-CM

## 2019-11-26 DIAGNOSIS — E1142 Type 2 diabetes mellitus with diabetic polyneuropathy: Secondary | ICD-10-CM

## 2019-11-26 NOTE — Telephone Encounter (Signed)
States that her bag was left open and her Lisinopril fell out and she can not find the medication.   She needs a refill on Lisinopril,Tramadol, and Gabapentin.  The lisinopril is the only medication that was lost everything else needs refills.  Sent to [facility] [place]

## 2019-11-26 NOTE — Telephone Encounter (Signed)
Will forward to pcp

## 2019-11-27 MED ORDER — GABAPENTIN 300 MG PO CAPS: ORAL_CAPSULE | ORAL | 5 refills | Status: DC

## 2019-11-27 MED ORDER — LISINOPRIL 5 MG PO TABS: 5.0000 mg | ORAL_TABLET | Freq: Every day | ORAL | 1 refills | Status: DC

## 2019-11-27 MED ORDER — TRAMADOL HCL 50 MG PO TABS: 50.0000 mg | ORAL_TABLET | Freq: Three times a day (TID) | ORAL | 0 refills | Status: DC | PRN

## 2019-11-27 MED FILL — GABAPENTIN 300 MG CAPSULE: 300 | 30 days supply | Qty: 120 | Fill #0

## 2019-11-27 MED FILL — traMADol HCL 50 MG TABS: 50 | 30 days supply | Qty: 90 | Fill #0

## 2019-11-27 NOTE — Addendum Note (Signed)
Addended by: Karle Plumber B on: 11/27/2019 01:35 PM   Modules accepted: Orders

## 2019-11-30 MED FILL — LISINOPRIL 5 MG TABLET: 5 | 90 days supply | Qty: 90 | Fill #0

## 2019-12-18 ENCOUNTER — Ambulatory Visit: Payer: Medicare Other | Admitting: Internal Medicine

## 2020-01-12 ENCOUNTER — Other Ambulatory Visit: Payer: Self-pay | Admitting: Internal Medicine

## 2020-01-12 NOTE — Telephone Encounter (Signed)
Patient called in and requested for listed medication to be refilled and sent to [facility], [address]  [place], [place] [address]   Patient also requested to ask pcp if it was okay for her to take 2 tramadol's occassionally when the pain doesn't subside. Please follow up at your earliest convenience.

## 2020-01-13 ENCOUNTER — Telehealth: Payer: Self-pay | Admitting: Internal Medicine

## 2020-01-13 NOTE — Telephone Encounter (Signed)
Patient called in and requested to speak with pcp nurse regarding her current situation and her tramadol. Patient stated that she is in the process of moving and needed that medication to get moved by the 15th of June. Patient was informed that she was in the need of an office visit. Please follow up at your earliest convenience.

## 2020-01-15 ENCOUNTER — Telehealth: Payer: Self-pay | Admitting: Internal Medicine

## 2020-01-15 MED ORDER — TRAMADOL HCL 50 MG PO TABS: 50.0000 mg | ORAL_TABLET | Freq: Three times a day (TID) | ORAL | 0 refills | Status: DC | PRN

## 2020-01-15 MED FILL — traMADol HCL 50 MG TABS: 50 | 14 days supply | Qty: 42 | Fill #0

## 2020-01-15 NOTE — Telephone Encounter (Signed)
Patient called wanted to talk to Dr Wynetta Emery nurse  Regarding  Her Tramadol  . Please, call her  Thank you

## 2020-01-15 NOTE — Telephone Encounter (Signed)
Contacted pt and went over Dr. Wynetta Emery message pt is schedule with walkin provider 6/10

## 2020-01-15 NOTE — Telephone Encounter (Signed)
Returned pt call pt states she is being evicted and what's not out(from the inside) by June 15th will be thorwn in the trash. Pt states she has till July 4th to get the ouside done. Pt is requesting her Tramadol to be filled and last till July 4th. Pt would like rx sent to [facility]

## 2020-01-28 ENCOUNTER — Ambulatory Visit: Payer: Medicare Other

## 2020-02-24 ENCOUNTER — Other Ambulatory Visit: Payer: Self-pay

## 2020-02-24 ENCOUNTER — Ambulatory Visit (HOSPITAL_COMMUNITY)
Admission: EM | Admit: 2020-02-24 | Discharge: 2020-02-24 | Disposition: A | Discharge status: Home / Self Care | Payer: Medicare Other | Attending: Urgent Care | Admitting: Urgent Care

## 2020-02-24 DIAGNOSIS — E119 Type 2 diabetes mellitus without complications: Secondary | ICD-10-CM | POA: Diagnosis not present

## 2020-02-24 DIAGNOSIS — G894 Chronic pain syndrome: Secondary | ICD-10-CM

## 2020-02-24 DIAGNOSIS — I10 Essential (primary) hypertension: Secondary | ICD-10-CM

## 2020-02-24 DIAGNOSIS — Z760 Encounter for issue of repeat prescription: Secondary | ICD-10-CM

## 2020-02-24 DIAGNOSIS — E1142 Type 2 diabetes mellitus with diabetic polyneuropathy: Secondary | ICD-10-CM

## 2020-02-24 DIAGNOSIS — M1611 Unilateral primary osteoarthritis, right hip: Secondary | ICD-10-CM

## 2020-02-24 DIAGNOSIS — R030 Elevated blood-pressure reading, without diagnosis of hypertension: Secondary | ICD-10-CM

## 2020-02-24 LAB — CBG MONITORING, ED: Glucose-Capillary: 139 mg/dL — ABNORMAL HIGH (ref 70–99)

## 2020-02-24 MED ORDER — PREDNISONE 20 MG PO TABS
ORAL_TABLET | ORAL | 0 refills | Status: DC
Start: 2020-02-24 — End: 2020-03-30

## 2020-02-24 MED ORDER — GABAPENTIN 300 MG PO CAPS: ORAL_CAPSULE | ORAL | 3 refills | Status: DC

## 2020-02-24 MED ORDER — LISINOPRIL 5 MG PO TABS: 5.0000 mg | ORAL_TABLET | Freq: Every day | ORAL | 0 refills | Status: DC

## 2020-02-24 MED ORDER — TRAMADOL HCL 50 MG PO TABS: 50.0000 mg | ORAL_TABLET | Freq: Three times a day (TID) | ORAL | 0 refills | Status: DC | PRN

## 2020-02-24 NOTE — ED Triage Notes (Signed)
Patient recently evicted.  Patient is requesting refill of tramadol and lisinopril and gabapentin.   Patient has been homeless, tells story of recently evicted.

## 2020-02-24 NOTE — Discharge Instructions (Addendum)
[facility] in [place], [place] Address: [address], [place], [place] [address] Health & safety: Mask required  Temperature check required  Staff wear masks  More details Phone: [phone]

## 2020-02-24 NOTE — ED Notes (Signed)
CBG 139 mg/dL reported to [employer] and Jaynee Eagles PA

## 2020-02-24 NOTE — ED Provider Notes (Signed)
[facility]   MRN: [id number] DOB: [birth year]  Subjective:   Donna Nguyen is a 79 y.o. female presenting for medication refill of her tramadol, lisinopril and gabapentin.  Patient has chronic pain of the right hip.  She states that she believes it was a torn muscle that she suffered 4 to 5 years ago.  She had a PCP, Dr. Wynetta Emery but was noncompliant with her office visits.  She was consistently getting tramadol for chronic pain.  However has not shown up to her last office visits.  She states that Dr. Wynetta Emery will no longer see her.  Patient is currently in the process of being evicted, is having to move and reports sinus congestion from all the dust that she cleaning off her things for the move.  Denies chest pain, heart racing, palpitations, headache, confusion.  No recent falls or trauma.  Has been very active trying to move as she is currently being evicted.  No current facility-administered medications for this encounter.  Current Outpatient Medications:    gabapentin (NEURONTIN) 300 MG capsule, TAKE 1 CAPSULE BY MOUTH 3 TIMES DAILY. MAY TAKE AN EXTRA DOSE ONCE A DAY AS NEEDED., Disp: 120 capsule, Rfl: 5   lisinopril (ZESTRIL) 5 MG tablet, Take 1 tablet (5 mg total) by mouth daily., Disp: 90 tablet, Rfl: 1   traMADol (ULTRAM) 50 MG tablet, Take 1 tablet (50 mg total) by mouth every 8 (eight) hours as needed., Disp: 42 tablet, Rfl: 0   cholecalciferol (VITAMIN D) 1000 units tablet, Take 1 tablet (1,000 Units total) by mouth daily., Disp: 100 tablet, Rfl: 2   NYSTATIN, TOPICAL, POWD, Apply topically., Disp: , Rfl:    pravastatin (PRAVACHOL) 20 MG tablet, TAKE 1 TABLET BY MOUTH DAILY., Disp: 90 tablet, Rfl: 0   solifenacin (VESICARE) 5 MG tablet, Take 1 tablet (5 mg total) by mouth daily., Disp: 30 tablet, Rfl: 2   triamcinolone cream (KENALOG) 0.1 %, Apply 1 application topically 2 (two) times daily., Disp: 30 g, Rfl: 0   vitamin B-12 (CYANOCOBALAMIN) 500 MCG tablet, Take  1 tablet (500 mcg total) by mouth 2 (two) times daily. (Patient not taking: Reported on 09/18/2017), Disp: 120 tablet, Rfl: 2   Allergies  Allergen Reactions   Acetaminophen Shortness Of Breath   Amitriptyline    Ciprofloxacin    Epinephrine    Penicillins    Metformin And Related Palpitations    Past Medical History:  Diagnosis Date   Diabetes mellitus without complication (HCC)    Hypertension    MI (mitral incompetence)    Stroke [facility])      Past Surgical History:  Procedure Laterality Date   bladdee     BLADDER SURGERY     MOLE REMOVAL      No family history on file.  Social History   Tobacco Use   Smoking status: Never Smoker   Smokeless tobacco: Never Used  Substance Use Topics   Alcohol use: Yes    Alcohol/week: 2.0 - 3.0 standard drinks    Types: 1 - 2 Glasses of wine, 1 Shots of liquor per week    Comment: socially   Drug use: No    ROS   Objective:   Vitals: BP (!) 153/70 (BP Location: Left Arm)    Pulse 84    Temp 97.9 F (36.6 C) (Oral)    Resp 18    SpO2 95%   Physical Exam Constitutional:      General: She is not in acute  distress.    Appearance: Normal appearance. She is well-developed. She is not ill-appearing, toxic-appearing or diaphoretic.  HENT:     Head: Normocephalic and atraumatic.     Nose: Nose normal.     Mouth/Throat:     Mouth: Mucous membranes are moist.  Eyes:     Extraocular Movements: Extraocular movements intact.     Pupils: Pupils are equal, round, and reactive to light.  Cardiovascular:     Rate and Rhythm: Normal rate and regular rhythm.     Pulses: Normal pulses.     Heart sounds: Normal heart sounds. No murmur heard.  No friction rub. No gallop.   Pulmonary:     Effort: Pulmonary effort is normal. No respiratory distress.     Breath sounds: Normal breath sounds. No stridor. No wheezing, rhonchi or rales.  Skin:    General: Skin is warm and dry.     Findings: No rash.  Neurological:      Mental Status: She is alert and oriented to person, place, and time.  Psychiatric:        Mood and Affect: Mood normal.        Behavior: Behavior normal.        Thought Content: Thought content normal.    Results for orders placed or performed during the hospital encounter of 02/24/20 (from the past 24 hour(s))  POC CBG monitoring     Status: Abnormal   Collection Time: 02/24/20 10:06 AM  Result Value Ref Range   Glucose-Capillary 139 (H) 70 - 99 mg/dL   Comment 1 Notify RN    Comment 2 Document in Chart     Assessment and Plan :   PDMP not reviewed this encounter.  1. Medication refill   2. Elevated blood pressure reading   3. Diabetic peripheral neuropathy ([place])   4. Essential hypertension   5. Chronic pain syndrome   6. Type 2 diabetes mellitus without complication, without long-term current use of insulin (HCC)   7. Osteoarthritis of right hip, unspecified osteoarthritis type     Counseled patient on need for follow-up and establishing care with an orthopedist given history of arthritis of multiple joints including severe arthritis of her right hip which is causing her chronic pain.  Patient was agreeable to using a prednisone course to help her with current arthritis flare.  Counseled against chronic opioid use.  I refilled her tramadol for 5 days.  Refilled her lisinopril and gabapentin as well.   Counseled patient on potential for adverse effects with medications prescribed/recommended today, ER and return-to-clinic precautions discussed, patient verbalized understanding.    Jaynee Eagles, [place] 02/24/20 1924

## 2020-03-30 ENCOUNTER — Encounter: Payer: Self-pay | Admitting: Critical Care Medicine

## 2020-03-30 ENCOUNTER — Other Ambulatory Visit: Payer: Self-pay | Admitting: Critical Care Medicine

## 2020-03-30 DIAGNOSIS — E1142 Type 2 diabetes mellitus with diabetic polyneuropathy: Secondary | ICD-10-CM

## 2020-03-30 MED ORDER — VITAMIN B-12 500 MCG PO TABS: 500.0000 ug | ORAL_TABLET | Freq: Two times a day (BID) | ORAL | 2 refills | Status: DC

## 2020-03-30 MED ORDER — GABAPENTIN 300 MG PO CAPS: ORAL_CAPSULE | ORAL | 3 refills | Status: DC

## 2020-03-30 MED ORDER — VITAMIN D 125 MCG (5000 UT) PO CAPS: 1.0000 | ORAL_CAPSULE | Freq: Every day | ORAL | 1 refills | Status: DC
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
## 2020-04-04 ENCOUNTER — Other Ambulatory Visit: Payer: Self-pay | Admitting: Critical Care Medicine

## 2020-04-04 DIAGNOSIS — M2550 Pain in unspecified joint: Secondary | ICD-10-CM

## 2020-04-04 DIAGNOSIS — R269 Unspecified abnormalities of gait and mobility: Secondary | ICD-10-CM

## 2020-04-04 NOTE — Progress Notes (Signed)
Needs rollator

## 2020-04-05 ENCOUNTER — Telehealth: Payer: Self-pay | Admitting: Critical Care Medicine

## 2020-04-05 NOTE — Telephone Encounter (Signed)
Called patient to schedule an appt with Dr. Joya Gaskins within the next 3 weeks and patient stated she would call back to schedule the appt.

## 2020-04-06 ENCOUNTER — Telehealth: Payer: Self-pay

## 2020-04-06 NOTE — Telephone Encounter (Signed)
Order for rollator faxed to [facility]

## 2020-04-07 ENCOUNTER — Encounter: Payer: Self-pay | Admitting: Critical Care Medicine

## 2020-04-07 ENCOUNTER — Other Ambulatory Visit: Payer: Self-pay | Admitting: Critical Care Medicine

## 2020-04-07 MED ORDER — TRAMADOL HCL 50 MG PO TABS: 50.0000 mg | ORAL_TABLET | Freq: Three times a day (TID) | ORAL | 0 refills | Status: DC | PRN

## 2020-04-07 MED FILL — GABAPENTIN 300 MG CAPSULE: 300 | 30 days supply | Qty: 90 | Fill #0

## 2020-04-07 MED FILL — traMADol HCL 50 MG TABS: 50 | 13 days supply | Qty: 40 | Fill #0

## 2020-04-08 NOTE — Progress Notes (Signed)
Patient ID: Donna Nguyen, female   DOB: 12/24/1940, 79 y.o.   MRN: [id number] This patient is seen today in the [facility] clinic 78 year old female recent admission to the shelter.  She is in need of her tramadol at this time to be refilled.  She does have chronic pain that she has bone-on-bone in the right hip with severe osteoarthritis needing hip replacement.  She now has a walker and appears to be more steady on her feet.  Plan is to refill the tramadol she will maintain her other medications and were attempting to get her into the community health and wellness clinic for further evaluation

## 2020-04-13 ENCOUNTER — Encounter: Payer: Self-pay | Admitting: Critical Care Medicine

## 2020-04-14 NOTE — Progress Notes (Signed)
Patient ID: Donna Nguyen, female   DOB: 1941-08-01, 79 y.o.   MRN: [id number] This is a 80 year old female seen in the [facility] clinic she is a recent arrival.  The patient just wanted to talk today and expressed her appreciation for the care she is received and wanted to remind her of her next appointment which is in the clinic.  I reminded her is on August 30 at 9:30 AM.  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  She does have history of hoarder type behavior when she was living in her former housing.  She is still reluctant to consider assisted living at this time.  We agreed that we would see her on August 30 and will do a complete evaluation including labs at that visit
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
## 2020-04-18 ENCOUNTER — Ambulatory Visit: Payer: Medicare Other | Attending: Critical Care Medicine | Admitting: Critical Care Medicine

## 2020-04-18 ENCOUNTER — Other Ambulatory Visit: Payer: Self-pay | Admitting: Critical Care Medicine

## 2020-04-18 ENCOUNTER — Telehealth: Payer: Self-pay | Admitting: *Deleted

## 2020-04-18 ENCOUNTER — Encounter: Payer: Self-pay | Admitting: Critical Care Medicine

## 2020-04-18 ENCOUNTER — Other Ambulatory Visit: Payer: Self-pay

## 2020-04-18 VITALS — BP 134/75 | HR 100 | Temp 97.7°F | Ht 64.0 in | Wt 198.0 lb

## 2020-04-18 DIAGNOSIS — F331 Major depressive disorder, recurrent, moderate: Secondary | ICD-10-CM

## 2020-04-18 DIAGNOSIS — E1122 Type 2 diabetes mellitus with diabetic chronic kidney disease: Secondary | ICD-10-CM | POA: Diagnosis not present

## 2020-04-18 DIAGNOSIS — R269 Unspecified abnormalities of gait and mobility: Secondary | ICD-10-CM

## 2020-04-18 DIAGNOSIS — E785 Hyperlipidemia, unspecified: Secondary | ICD-10-CM

## 2020-04-18 DIAGNOSIS — G8929 Other chronic pain: Secondary | ICD-10-CM | POA: Insufficient documentation

## 2020-04-18 DIAGNOSIS — M25551 Pain in right hip: Secondary | ICD-10-CM

## 2020-04-18 DIAGNOSIS — E538 Deficiency of other specified B group vitamins: Secondary | ICD-10-CM

## 2020-04-18 DIAGNOSIS — M150 Primary generalized (osteo)arthritis: Secondary | ICD-10-CM

## 2020-04-18 DIAGNOSIS — M8949 Other hypertrophic osteoarthropathy, multiple sites: Secondary | ICD-10-CM

## 2020-04-18 DIAGNOSIS — B351 Tinea unguium: Secondary | ICD-10-CM

## 2020-04-18 DIAGNOSIS — I10 Essential (primary) hypertension: Secondary | ICD-10-CM | POA: Diagnosis not present

## 2020-04-18 DIAGNOSIS — Z1159 Encounter for screening for other viral diseases: Secondary | ICD-10-CM | POA: Diagnosis not present

## 2020-04-18 DIAGNOSIS — E1169 Type 2 diabetes mellitus with other specified complication: Secondary | ICD-10-CM

## 2020-04-18 DIAGNOSIS — Z5900 Homelessness unspecified: Secondary | ICD-10-CM

## 2020-04-18 DIAGNOSIS — Z114 Encounter for screening for human immunodeficiency virus [HIV]: Secondary | ICD-10-CM | POA: Diagnosis not present

## 2020-04-18 DIAGNOSIS — Z9189 Other specified personal risk factors, not elsewhere classified: Secondary | ICD-10-CM

## 2020-04-18 DIAGNOSIS — R413 Other amnesia: Secondary | ICD-10-CM

## 2020-04-18 DIAGNOSIS — F423 Hoarding disorder: Secondary | ICD-10-CM

## 2020-04-18 DIAGNOSIS — N182 Chronic kidney disease, stage 2 (mild): Secondary | ICD-10-CM | POA: Diagnosis not present

## 2020-04-18 DIAGNOSIS — E1149 Type 2 diabetes mellitus with other diabetic neurological complication: Secondary | ICD-10-CM

## 2020-04-18 DIAGNOSIS — M159 Polyosteoarthritis, unspecified: Secondary | ICD-10-CM

## 2020-04-18 DIAGNOSIS — W19XXXD Unspecified fall, subsequent encounter: Secondary | ICD-10-CM

## 2020-04-18 DIAGNOSIS — R937 Abnormal findings on diagnostic imaging of other parts of musculoskeletal system: Secondary | ICD-10-CM

## 2020-04-18 DIAGNOSIS — R030 Elevated blood-pressure reading, without diagnosis of hypertension: Secondary | ICD-10-CM

## 2020-04-18 HISTORY — DX: Homelessness unspecified: Z59.00

## 2020-04-18 MED ORDER — LISINOPRIL 5 MG PO TABS: 5.0000 mg | ORAL_TABLET | Freq: Every day | ORAL | 2 refills | Status: DC

## 2020-04-18 MED ORDER — VITAMIN B-12 500 MCG PO TABS
500.0000 ug | ORAL_TABLET | Freq: Every day | ORAL | 2 refills | Status: DC
Start: 2020-04-18 — End: 2020-06-07

## 2020-04-18 MED ORDER — TRAMADOL HCL 50 MG PO TABS
50.0000 mg | ORAL_TABLET | Freq: Three times a day (TID) | ORAL | 0 refills | Status: DC | PRN
Start: 2020-04-18 — End: 2020-05-04

## 2020-04-18 MED ORDER — VITAMIN D 125 MCG (5000 UT) PO CAPS
1.0000 | ORAL_CAPSULE | Freq: Every day | ORAL | 1 refills | Status: DC
Start: 2020-04-18 — End: 2020-06-07

## 2020-04-18 MED FILL — traMADol HCL 50 MG TABS: 50 | 13 days supply | Qty: 40 | Fill #0

## 2020-04-18 NOTE — Assessment & Plan Note (Signed)
Hypertension under reasonable control at this time will continue lisinopril 5 mg daily

## 2020-04-18 NOTE — Assessment & Plan Note (Signed)
At risk for bone density loss will obtain a bone DEXA scan

## 2020-04-18 NOTE — Telephone Encounter (Signed)
Patient states she forgot to tell you the shoes she needs is a size 7 slide-ins

## 2020-04-18 NOTE — Patient Instructions (Addendum)
Refills on tramadol, vitamin B12, vitamin D, lisinopril sent to our pharmacy  We will be checking complete set of blood on you today to evaluate your liver kidneys blood counts vitamin B12 vitamin D levels lipid panel and thyroid panel  A bone DEXA scan will be scheduled  A referral to neurology will be made to evaluate your memory loss  Referral to podiatry we made to evaluate your toenail fungus and help you with your foot care  We will connect with case management regarding the Rollator for you  You declined a flu vaccine and Covid vaccine due to severe allergy reaction to flu shot in the past  Referral to physical therapy was made and also orthopedic surgery  Return to Dr. Joya Gaskins 1 month

## 2020-04-18 NOTE — Assessment & Plan Note (Signed)
Refilling tramadol and referring to orthopedics

## 2020-04-18 NOTE — Assessment & Plan Note (Signed)
History of vitamin B12 and vitamin D deficiency we will follow-up levels and resume vitamin D and vitamin B12 supplementation

## 2020-04-18 NOTE — Assessment & Plan Note (Signed)
Primary osteoarthritis involving multiple joints with the right hip a major issue with prior x-ray showing bone-on-bone  Tramadol was refilled [place] drug database checked and there are not multiple prescribers Referral to orthopedics was made for second opinion on right hip

## 2020-04-18 NOTE — Assessment & Plan Note (Signed)
Type 2 diabetes with neuropathy will refill gabapentin  Will obtain hemoglobin A1c at this visit and note patient currently not on any glycemic control therapy and not checking her blood sugars on a regular basis  She also has an aversion to fingersticks

## 2020-04-18 NOTE — Assessment & Plan Note (Signed)
Gait disturbance with frequent falls will refer to physical therapy

## 2020-04-18 NOTE — Assessment & Plan Note (Signed)
Prior history of hyperlipidemia not currently on a statin will obtain lipid panel and potentially restart statin therapy

## 2020-04-18 NOTE — Assessment & Plan Note (Signed)
Patient currently at [facility] have encouraged this patient to give consideration to assisted living

## 2020-04-18 NOTE — Assessment & Plan Note (Signed)
Will refer to podiatry regarding toenail fungus

## 2020-04-18 NOTE — Assessment & Plan Note (Signed)
History of recurrent major depression currently in remission we will monitor

## 2020-04-18 NOTE — Assessment & Plan Note (Signed)
History of chronic kidney disease we will follow-up renal function at this visit associated with diabetes

## 2020-04-19 ENCOUNTER — Other Ambulatory Visit: Payer: Self-pay | Admitting: Critical Care Medicine

## 2020-04-19 ENCOUNTER — Telehealth: Payer: Self-pay | Admitting: Critical Care Medicine

## 2020-04-19 LAB — LIPID PANEL
Chol/HDL Ratio: 5.3 ratio — ABNORMAL HIGH (ref 0.0–4.4)
Cholesterol, Total: 265 mg/dL — ABNORMAL HIGH (ref 100–199)
HDL: 50 mg/dL (ref >39)
LDL Chol Calc (NIH): 177 mg/dL — ABNORMAL HIGH (ref 0–99)
Triglycerides: 203 mg/dL — ABNORMAL HIGH (ref 0–149)
VLDL Cholesterol Cal: 38 mg/dL (ref 5–40)

## 2020-04-19 LAB — COMPREHENSIVE METABOLIC PANEL
ALT: 20 IU/L (ref 0–32)
AST: 12 IU/L (ref 0–40)
Albumin/Globulin Ratio: 1.9 (ref 1.2–2.2)
Albumin: 4.3 g/dL (ref 3.7–4.7)
Alkaline Phosphatase: 92 IU/L (ref 48–121)
BUN/Creatinine Ratio: 17 (ref 12–28)
BUN: 28 mg/dL — ABNORMAL HIGH (ref 8–27)
Bilirubin Total: 0.3 mg/dL (ref 0.0–1.2)
CO2: 23 mmol/L (ref 20–29)
Calcium: 9.3 mg/dL (ref 8.7–10.3)
Chloride: 102 mmol/L (ref 96–106)
Creatinine, Ser: 1.68 mg/dL — ABNORMAL HIGH (ref 0.57–1.00)
GFR calc Af Amer: 34 mL/min/{1.73_m2} — ABNORMAL LOW (ref >59)
GFR calc non Af Amer: 29 mL/min/{1.73_m2} — ABNORMAL LOW (ref >59)
Globulin, Total: 2.3 g/dL (ref 1.5–4.5)
Glucose: 116 mg/dL — ABNORMAL HIGH (ref 65–99)
Potassium: 4.5 mmol/L (ref 3.5–5.2)
Sodium: 142 mmol/L (ref 134–144)
Total Protein: 6.6 g/dL (ref 6.0–8.5)

## 2020-04-19 LAB — HCV INTERPRETATION

## 2020-04-19 LAB — HIV ANTIBODY (ROUTINE TESTING W REFLEX): HIV Screen 4th Generation wRfx: NONREACTIVE

## 2020-04-19 LAB — HEMOGLOBIN A1C
Est. average glucose Bld gHb Est-mCnc: 177 mg/dL
Hgb A1c MFr Bld: 7.8 % — ABNORMAL HIGH (ref 4.8–5.6)

## 2020-04-19 LAB — CBC WITH DIFFERENTIAL/PLATELET
Basophils Absolute: 0 10*3/uL (ref 0.0–0.2)
Basos: 0 %
EOS (ABSOLUTE): 0.2 10*3/uL (ref 0.0–0.4)
Eos: 2 %
Hematocrit: 38 % (ref 34.0–46.6)
Hemoglobin: 13.1 g/dL (ref 11.1–15.9)
Immature Grans (Abs): 0 10*3/uL (ref 0.0–0.1)
Immature Granulocytes: 0 %
Lymphocytes Absolute: 5.7 10*3/uL — ABNORMAL HIGH (ref 0.7–3.1)
Lymphs: 42 %
MCH: 30.6 pg (ref 26.6–33.0)
MCHC: 34.5 g/dL (ref 31.5–35.7)
MCV: 89 fL (ref 79–97)
Monocytes Absolute: 0.8 10*3/uL (ref 0.1–0.9)
Monocytes: 6 %
Neutrophils Absolute: 6.9 10*3/uL (ref 1.4–7.0)
Neutrophils: 50 %
Platelets: 265 10*3/uL (ref 150–450)
RBC: 4.28 x10E6/uL (ref 3.77–5.28)
RDW: 13.8 % (ref 11.7–15.4)
WBC: 13.8 10*3/uL — ABNORMAL HIGH (ref 3.4–10.8)

## 2020-04-19 LAB — VITAMIN B12: Vitamin B-12: 377 pg/mL (ref 232–1245)

## 2020-04-19 LAB — THYROID PANEL WITH TSH
Free Thyroxine Index: 2 (ref 1.2–4.9)
T3 Uptake Ratio: 25 % (ref 24–39)
T4, Total: 8 ug/dL (ref 4.5–12.0)
TSH: 3.05 u[IU]/mL (ref 0.450–4.500)

## 2020-04-19 LAB — VITAMIN D 25 HYDROXY (VIT D DEFICIENCY, FRACTURES): Vit D, 25-Hydroxy: 29 ng/mL — ABNORMAL LOW (ref 30.0–100.0)

## 2020-04-19 LAB — HCV AB W/RFLX TO VERIFICATION: HCV Ab: <0.1 s/co ratio (ref 0.0–0.9)

## 2020-04-19 MED ORDER — ATORVASTATIN CALCIUM 20 MG PO TABS: 20.0000 mg | ORAL_TABLET | Freq: Every day | ORAL | 3 refills | Status: DC

## 2020-04-19 MED FILL — ATORVASTATIN CALCIUM 20 MG: 20 | 90 days supply | Qty: 90 | Fill #0

## 2020-04-19 NOTE — Telephone Encounter (Signed)
Pt returned call for labs, result note read to patient; verbalizes understanding.

## 2020-04-20 ENCOUNTER — Telehealth: Payer: Self-pay

## 2020-04-20 ENCOUNTER — Other Ambulatory Visit: Payer: Self-pay | Admitting: Critical Care Medicine

## 2020-04-20 ENCOUNTER — Encounter: Payer: Self-pay | Admitting: Critical Care Medicine

## 2020-04-20 MED ORDER — EMPAGLIFLOZIN 10 MG PO TABS: 10.0000 mg | ORAL_TABLET | Freq: Every day | ORAL | 1 refills | Status: DC

## 2020-04-20 NOTE — Telephone Encounter (Signed)
Call placed to [facility] to check on status if order for rollator. Spoke to [place] who said that they tried to contact the patient 3-4 times but she has not returned the call.  She has a $10.39 copay for the rollator.  Ardelia Mems said that the patient just needs to call their office.   Call placed to patient and informed her of above and provided her with the phone number for [place] # [phone].  She said she would call in the morning.   Instructed her to call this CM if she has any difficulty obtaining the rollator and she said she understood

## 2020-04-20 NOTE — Progress Notes (Signed)
Rx jardiance

## 2020-04-20 NOTE — Telephone Encounter (Signed)
Thank you :)

## 2020-04-20 NOTE — Progress Notes (Signed)
Patient ID: Donna Nguyen, female   DOB: 27-Nov-1940, 79 y.o.   MRN: [id number] This patient is seen in return follow-up at the [facility] clinic I just saw her earlier this week I went over labs with her she is progressed with her chronic kidney disease, she has a hemoglobin A1c of 7.8, white count is elevated with lymphocytosis, she is yet to receive her Rollator, liver function was stable, hepatitis C assay negative as was HIV, he does have increased cholesterol levels, hemoglobin is stable, vitamin D levels are low B12 levels are low normal  Patient states she is having some loose stools  Plan will be for this patient receive atorvastatin 1 daily 20 mg begin Jardiance 10 mg daily for diabetes referral to renal will be made she was given loperamide for diarrhea as needed

## 2020-04-21 ENCOUNTER — Ambulatory Visit: Payer: Self-pay

## 2020-04-21 ENCOUNTER — Encounter: Payer: Self-pay | Admitting: Orthopaedic Surgery

## 2020-04-21 ENCOUNTER — Other Ambulatory Visit: Payer: Self-pay | Admitting: Critical Care Medicine

## 2020-04-21 ENCOUNTER — Ambulatory Visit (INDEPENDENT_AMBULATORY_CARE_PROVIDER_SITE_OTHER): Payer: Medicare Other | Admitting: Orthopaedic Surgery

## 2020-04-21 DIAGNOSIS — M1611 Unilateral primary osteoarthritis, right hip: Secondary | ICD-10-CM

## 2020-04-21 DIAGNOSIS — N1831 Chronic kidney disease, stage 3a: Secondary | ICD-10-CM

## 2020-04-21 MED FILL — JARDIANCE 10 MG TABLET: 10 | 30 days supply | Qty: 30 | Fill #0

## 2020-04-21 NOTE — Progress Notes (Signed)
Office Visit Note   Patient: Donna Nguyen           Date of Birth: 1941-06-16           MRN: [id number] Visit Date: 04/21/2020              Requested by: Elsie Stain, MD [address],  [place] [address] PCP: Elsie Stain, MD   Assessment & Plan: Visit Diagnoses:  1. Primary osteoarthritis of right hip     Plan: Impression is right hip advanced degenerative joint disease.  At this point, we have discussed cortisone injection versus total hip arthroplasty.  Due to her needle phobia she is not interested in cortisone injection.  Due to her living situation and current Covid restrictions, we are unable to proceed with total hip replacement.  Once her living situation improves and Covid restrictions are removed, she will follow up with [place] for discussion of total hip replacement.  Follow-Up Instructions: Return if symptoms worsen or fail to improve.   Orders:  Orders Placed This Encounter  Procedures  . XR HIP UNILAT W OR W/O PELVIS 2-3 VIEWS RIGHT   No orders of the defined types were placed in this encounter.     Procedures: No procedures performed   Clinical Data: No additional findings.   Subjective: Chief Complaint  Patient presents with  . Right Hip - Pain    HPI patient is a very pleasant 79 year old female who comes in today with right hip pain.  This is been ongoing for the past 4 to 5 years.  The pain she has is to the groin.  She describes this as a constant ache worse with ambulation.  She does ambulate with a walker due to this pain.  She has tried prednisone, tramadol and gabapentin in the past without significant relief of symptoms.  No previous history of cortisone injections to the right hip as she has a severe needle phobia.  Of note, she is a diabetic with her last hemoglobin A1c of 7.8.  She is homeless and currently lives in [place].  Review of Systems as detailed in HPI.  All others reviewed and are  negative.   Objective: Vital Signs: There were no vitals taken for this visit.  Physical Exam well-developed well-nourished female no acute distress.  Alert and oriented x3.  Ortho Exam examination of the right hip reveals a markedly positive logroll.  She is neurovascular intact distally.  Specialty Comments:  No specialty comments available.  Imaging: XR HIP UNILAT W OR W/O PELVIS 2-3 VIEWS RIGHT  Result Date: 04/21/2020 X-rays demonstrate bone-on-bone right hip    PMFS History: Patient Active Problem List   Diagnosis Date Noted  . Homelessness 04/18/2020  . Hoarding behavior 04/18/2020  . Hyperlipidemia associated with type 2 diabetes mellitus ([place]) 04/18/2020  . Essential hypertension 04/18/2020  . Toenail fungus 04/18/2020  . Chronic hip pain, right 04/18/2020  . At risk for loss of bone density 04/18/2020  . Primary osteoarthritis involving multiple joints 09/10/2018  . Moderate episode of recurrent major depressive disorder ([place]) 09/10/2018  . Urge incontinence 09/10/2018  . Erythema migrans (Lyme disease) 11/05/2017  . Vitamin B 12 deficiency 11/05/2017  . Gait disturbance 11/05/2017  . Polyarthralgia 11/05/2017  . Fall 09/18/2017  . Fibromyalgia 09/18/2017  . Bilateral carpal tunnel syndrome 08/01/2017  . Type 2 diabetes mellitus with stage 2 chronic kidney disease ([place]) 06/19/2015  . Type 2 diabetes mellitus with neurological manifestations ([place])  03/14/2015  . Memory loss 03/14/2015   Past Medical History:  Diagnosis Date  . Diabetes mellitus without complication ([place])   . Hypertension   . MI (mitral incompetence)   . Stroke [facility])     History reviewed. No pertinent family history.  Past Surgical History:  Procedure Laterality Date  . bladdee    . BLADDER SURGERY    . MOLE REMOVAL     Social History   Occupational History  . Not on file  Tobacco Use  . Smoking status: Never Smoker  . Smokeless tobacco: Never Used  Substance and Sexual Activity   . Alcohol use: Yes    Alcohol/week: 2.0 - 3.0 standard drinks    Types: 1 - 2 Glasses of wine, 1 Shots of liquor per week    Comment: socially  . Drug use: No  . Sexual activity: Yes    Birth control/protection: Post-menopausal

## 2020-04-21 NOTE — Progress Notes (Signed)
Ref to renal

## 2020-04-27 MED FILL — ATORVASTATIN CALCIUM 20 MG: 20 | 90 days supply | Qty: 90 | Fill #0

## 2020-05-02 MED FILL — GABAPENTIN 300 MG CAPSULE: 300 | 30 days supply | Qty: 90 | Fill #1

## 2020-05-04 ENCOUNTER — Encounter: Payer: Self-pay | Admitting: Critical Care Medicine

## 2020-05-04 ENCOUNTER — Other Ambulatory Visit: Payer: Self-pay | Admitting: Critical Care Medicine

## 2020-05-04 DIAGNOSIS — H900 Conductive hearing loss, bilateral: Secondary | ICD-10-CM

## 2020-05-04 DIAGNOSIS — H547 Unspecified visual loss: Secondary | ICD-10-CM

## 2020-05-04 DIAGNOSIS — G5603 Carpal tunnel syndrome, bilateral upper limbs: Secondary | ICD-10-CM

## 2020-05-04 MED ORDER — TRAMADOL HCL 50 MG PO TABS
50.0000 mg | ORAL_TABLET | Freq: Three times a day (TID) | ORAL | 0 refills | Status: DC | PRN
Start: 2020-05-04 — End: 2020-05-18

## 2020-05-04 MED FILL — traMADol HCL 50 MG TABS: 50 | 13 days supply | Qty: 40 | Fill #0
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
## 2020-05-04 NOTE — Progress Notes (Signed)
Referrals

## 2020-05-05 ENCOUNTER — Ambulatory Visit (INDEPENDENT_AMBULATORY_CARE_PROVIDER_SITE_OTHER): Payer: Medicare Other | Admitting: Orthopaedic Surgery

## 2020-05-05 ENCOUNTER — Encounter: Payer: Self-pay | Admitting: Orthopaedic Surgery

## 2020-05-05 VITALS — Ht 65.5 in | Wt 198.0 lb

## 2020-05-05 DIAGNOSIS — G5603 Carpal tunnel syndrome, bilateral upper limbs: Secondary | ICD-10-CM

## 2020-05-05 NOTE — Progress Notes (Signed)
Office Visit Note   Patient: Donna Nguyen           Date of Birth: 08-07-1941           MRN: [id number] Visit Date: 05/05/2020              Requested by: Elsie Stain, MD [address],  [place] [address] PCP: Elsie Stain, MD   Assessment & Plan: Visit Diagnoses:  1. Bilateral carpal tunnel syndrome     Plan: Impression is suspected bilateral carpal tunnel syndrome.  We will fit her with nighttime braces today.  We will also arrange for her to get nerve conduction studies.  Follow-up after the studies.  Follow-Up Instructions: Return if symptoms worsen or fail to improve.   Orders:  Orders Placed This Encounter  Procedures  . Ambulatory referral to Physical Medicine Rehab   No orders of the defined types were placed in this encounter.     Procedures: No procedures performed   Clinical Data: No additional findings.   Subjective: Chief Complaint  Patient presents with  . Right Wrist - Pain  . Left Wrist - Pain    [name] comes in today for evaluation of bilateral hand numbness and tingling and a burning pain.  She has been referred here by her PCP for concern of bilateral carpal tunnel syndrome.  Denies any injuries.   Review of Systems  Constitutional: Negative.   HENT: Negative.   Eyes: Negative.   Respiratory: Negative.   Cardiovascular: Negative.   Endocrine: Negative.   Musculoskeletal: Negative.   Neurological: Negative.   Hematological: Negative.   Psychiatric/Behavioral: Negative.   All other systems reviewed and are negative.    Objective: Vital Signs: Ht 5' 5.5" (1.664 m)   Wt 198 lb (89.8 kg)   BMI 32.45 kg/m   Physical Exam Vitals and nursing note reviewed.  Constitutional:      Appearance: She is well-developed.  Pulmonary:     Effort: Pulmonary effort is normal.  Skin:    General: Skin is warm.     Capillary Refill: Capillary refill takes less than 2 seconds.  Neurological:     Mental Status: She is  alert and oriented to person, place, and time.  Psychiatric:        Behavior: Behavior normal.        Thought Content: Thought content normal.        Judgment: Judgment normal.     Ortho Exam Bilateral hands show mild muscle atrophy of the thenar compartment of the left hand.  Negative carpal tunnel compressive signs. Specialty Comments:  No specialty comments available.  Imaging: No results found.   PMFS History: Patient Active Problem List   Diagnosis Date Noted  . Homelessness 04/18/2020  . Hoarding behavior 04/18/2020  . Hyperlipidemia associated with type 2 diabetes mellitus ([place]) 04/18/2020  . Essential hypertension 04/18/2020  . Toenail fungus 04/18/2020  . Chronic hip pain, right 04/18/2020  . At risk for loss of bone density 04/18/2020  . Primary osteoarthritis involving multiple joints 09/10/2018  . Moderate episode of recurrent major depressive disorder ([place]) 09/10/2018  . Urge incontinence 09/10/2018  . Erythema migrans (Lyme disease) 11/05/2017  . Vitamin B 12 deficiency 11/05/2017  . Gait disturbance 11/05/2017  . Polyarthralgia 11/05/2017  . Fall 09/18/2017  . Fibromyalgia 09/18/2017  . Bilateral carpal tunnel syndrome 08/01/2017  . Type 2 diabetes mellitus with stage 2 chronic kidney disease ([place]) 06/19/2015  . Type 2 diabetes  mellitus with neurological manifestations ([place]) 03/14/2015  . Memory loss 03/14/2015   Past Medical History:  Diagnosis Date  . Diabetes mellitus without complication ([place])   . Hypertension   . MI (mitral incompetence)   . Stroke [facility])     History reviewed. No pertinent family history.  Past Surgical History:  Procedure Laterality Date  . bladdee    . BLADDER SURGERY    . MOLE REMOVAL     Social History   Occupational History  . Not on file  Tobacco Use  . Smoking status: Never Smoker  . Smokeless tobacco: Never Used  Substance and Sexual Activity  . Alcohol use: Yes    Alcohol/week: 2.0 - 3.0 standard drinks     Types: 1 - 2 Glasses of wine, 1 Shots of liquor per week    Comment: socially  . Drug use: No  . Sexual activity: Yes    Birth control/protection: Post-menopausal

## 2020-05-10 ENCOUNTER — Telehealth: Payer: Self-pay

## 2020-05-10 ENCOUNTER — Other Ambulatory Visit: Payer: Self-pay

## 2020-05-10 ENCOUNTER — Ambulatory Visit (INDEPENDENT_AMBULATORY_CARE_PROVIDER_SITE_OTHER): Payer: Medicare Other | Admitting: Podiatry

## 2020-05-10 ENCOUNTER — Encounter: Payer: Self-pay | Admitting: Podiatry

## 2020-05-10 DIAGNOSIS — M79676 Pain in unspecified toe(s): Secondary | ICD-10-CM

## 2020-05-10 DIAGNOSIS — B351 Tinea unguium: Secondary | ICD-10-CM

## 2020-05-10 DIAGNOSIS — E1142 Type 2 diabetes mellitus with diabetic polyneuropathy: Secondary | ICD-10-CM | POA: Diagnosis not present

## 2020-05-10 NOTE — Progress Notes (Signed)
  Subjective:  Patient ID: Donna Nguyen, female    DOB: 08/09/1941,  MRN: [id number] HPI Chief Complaint  Patient presents with  . Debridement    Trim toenails - unable to cut herself due to hip trouble   . Diabetes    Last a1c was 7.8  . New Patient (Initial Visit)    79 y.o. female presents with the above complaint.   ROS: Denies fever chills nausea vomiting muscle aches pains calf pain back pain chest pain shortness of breath.  Past Medical History:  Diagnosis Date  . Diabetes mellitus without complication ([place])   . Hypertension   . MI (mitral incompetence)   . Stroke [facility])    Past Surgical History:  Procedure Laterality Date  . bladdee    . BLADDER SURGERY    . MOLE REMOVAL      Current Outpatient Medications:  .  atorvastatin (LIPITOR) 20 MG tablet, Take 1 tablet (20 mg total) by mouth daily., Disp: 90 tablet, Rfl: 3 .  Cholecalciferol (VITAMIN D) 125 MCG (5000 UT) CAPS, Take 1 capsule by mouth daily., Disp: 60 capsule, Rfl: 1 .  empagliflozin (JARDIANCE) 10 MG TABS tablet, Take 1 tablet (10 mg total) by mouth daily., Disp: 30 tablet, Rfl: 1 .  gabapentin (NEURONTIN) 300 MG capsule, TAKE 1 CAPSULE BY MOUTH 3 TIMES DAILY. MAY TAKE AN EXTRA DOSE ONCE A DAY AS NEEDED., Disp: 90 capsule, Rfl: 3 .  lisinopril (ZESTRIL) 5 MG tablet, Take 1 tablet (5 mg total) by mouth daily., Disp: 90 tablet, Rfl: 2 .  traMADol (ULTRAM) 50 MG tablet, Take 1 tablet (50 mg total) by mouth every 8 (eight) hours as needed for severe pain., Disp: 40 tablet, Rfl: 0 .  vitamin B-12 (CYANOCOBALAMIN) 500 MCG tablet, Take 1 tablet (500 mcg total) by mouth daily., Disp: 90 tablet, Rfl: 2  Allergies  Allergen Reactions  . Acetaminophen Shortness Of Breath  . Influenza Virus Vaccine Split [Influenza Vac Typ A&B Surf Ant] Swelling  . Amitriptyline   . Ciprofloxacin   . Epinephrine   . Penicillins   . Metformin And Related Palpitations   Review of Systems Objective:  There were no vitals filed  for this visit.  General: Well developed, nourished, in no acute distress, alert and oriented x3   Dermatological: Skin is warm, dry and supple bilateral. Nails x 10 are neglected they are long thick yellow curved and ingrown.  Remaining integument appears unremarkable at this time. There are no open sores, no preulcerative lesions, no rash or signs of infection present.  Vascular: Dorsalis Pedis artery and Posterior Tibial artery pedal pulses are 2/4 bilateral with immedate capillary fill time. Pedal hair growth present. No varicosities and no lower extremity edema present bilateral.   Neruologic: Grossly intact via light touch bilateral. Vibratory intact via tuning fork bilateral. Protective threshold with Semmes Wienstein monofilament intact to all pedal sites bilateral. Patellar and Achilles deep tendon reflexes 2+ bilateral. No Babinski or clonus noted bilateral.   Musculoskeletal: No gross boney pedal deformities bilateral. No pain, crepitus, or limitation noted with foot and ankle range of motion bilateral. Muscular strength 5/5 in all groups tested bilateral.  Gait: Unassisted, Nonantalgic.    Radiographs:  None taken  Assessment & Plan:   Assessment: Pain in limb secondary to onychomycosis.  Plan: Debrided toenails 1 through 5 bilateral follow-up with Dr. Adah Perl.     Donna Nguyen T. [place], [place]

## 2020-05-10 NOTE — Telephone Encounter (Signed)
Call placed to [employer], spoke to Webb who said that the rollator was delivered to the patient 05/04/2020

## 2020-05-17 ENCOUNTER — Other Ambulatory Visit: Payer: Self-pay

## 2020-05-17 ENCOUNTER — Ambulatory Visit: Payer: Medicare Other | Attending: Audiologist | Admitting: Audiologist

## 2020-05-17 DIAGNOSIS — H903 Sensorineural hearing loss, bilateral: Secondary | ICD-10-CM
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
## 2020-05-18 ENCOUNTER — Encounter: Payer: Self-pay | Admitting: Critical Care Medicine

## 2020-05-18 ENCOUNTER — Other Ambulatory Visit: Payer: Self-pay | Admitting: Critical Care Medicine

## 2020-05-18 MED ORDER — TRAMADOL HCL 50 MG PO TABS
50.0000 mg | ORAL_TABLET | Freq: Three times a day (TID) | ORAL | 0 refills | Status: DC | PRN
Start: 2020-05-18 — End: 2020-05-18

## 2020-05-18 MED ORDER — METOPROLOL SUCCINATE ER 25 MG PO TB24: 25.0000 mg | ORAL_TABLET | Freq: Every day | ORAL | 1 refills | Status: DC

## 2020-05-18 MED ORDER — TRAMADOL HCL 50 MG PO TABS
50.0000 mg | ORAL_TABLET | Freq: Three times a day (TID) | ORAL | 0 refills | Status: DC | PRN
Start: 2020-05-18 — End: 2020-05-31

## 2020-05-18 MED FILL — METOPROLOL SUCCINATE ER 25: 25 | 30 days supply | Qty: 30 | Fill #0

## 2020-05-18 MED FILL — traMADol HCL 50 MG TABS: 50 | 13 days supply | Qty: 40 | Fill #0
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
## 2020-05-23 ENCOUNTER — Other Ambulatory Visit: Payer: Self-pay | Admitting: Critical Care Medicine

## 2020-05-23 MED FILL — LISINOPRIL 5 MG TABLET: 5 | 90 days supply | Qty: 90 | Fill #0

## 2020-05-23 MED FILL — JARDIANCE 10 MG TABLET: 10 | 30 days supply | Qty: 30 | Fill #1

## 2020-05-23 NOTE — Telephone Encounter (Signed)
Requested medication (s) are due for refill today: yes   Requested medication (s) are on the active medication list: yes   Last refill:  05/18/20 #40 0 refills   Future visit scheduled: yes in 2 weeks   Notes to clinic:  not delegated per protocol     Requested Prescriptions  Pending Prescriptions Disp Refills   traMADol (ULTRAM) 50 MG tablet [Pharmacy Med Name: traMADol HCL 50 MG TABS 50 Tablet] 40 tablet 0    Sig: Take 1 tablet (50 mg total) by mouth every 8 (eight) hours as needed for severe pain.      Not Delegated - Analgesics:  Opioid Agonists Failed - 05/23/2020  9:39 AM      Failed - This refill cannot be delegated      Failed - Urine Drug Screen completed in last 360 days.      Passed - Valid encounter within last 6 months    Recent Outpatient Visits           1 month ago Type 2 diabetes mellitus with stage 2 chronic kidney disease, without long-term current use of insulin ([place])   [place] Elsie Stain, MD   10 months ago Viral respiratory illness   [place], Deborah B, MD   1 year ago Primary osteoarthritis involving multiple joints   [place], Deborah B, MD   1 year ago Primary osteoarthritis involving multiple joints   [place], Deborah B, MD   1 year ago Type 2 diabetes, controlled, with peripheral neuropathy [facility])   [place], MD       Future Appointments             In 2 weeks Elsie Stain, MD [place]

## 2020-05-23 NOTE — Telephone Encounter (Signed)
Will forward to pcp

## 2020-05-30 ENCOUNTER — Other Ambulatory Visit: Payer: Self-pay | Admitting: Critical Care Medicine

## 2020-05-30 MED FILL — GABAPENTIN 300 MG CAPSULE: 300 | 22 days supply | Qty: 90 | Fill #2

## 2020-05-30 NOTE — Telephone Encounter (Signed)
1) Medication(s) Requested (by name): Tramadol  2) Pharmacy of Choice: [facility]   3) Special Requests:   Approved medications will be sent to the pharmacy, we will reach out if there is an issue.  Requests made after 3pm may not be addressed until the following business day!  If a patient is unsure of the name of the medication(s) please note and ask patient to call back when they are able to provide all info, do not send to responsible party until all information is available!

## 2020-05-30 NOTE — Telephone Encounter (Signed)
Requested medication (s) are due for refill today: yes  Requested medication (s) are on the active medication list: yes  Last refill:  05/18/20   Future visit scheduled: yes  Notes to clinic:  med not delegated to NT to RF   Requested Prescriptions  Pending Prescriptions Disp Refills   traMADol (ULTRAM) 50 MG tablet [Pharmacy Med Name: traMADol HCL 50 MG TABS 50 Tablet] 40 tablet 0    Sig: Take 1 tablet (50 mg total) by mouth every 8 (eight) hours as needed for severe pain.      Not Delegated - Analgesics:  Opioid Agonists Failed - 05/30/2020  3:25 PM      Failed - This refill cannot be delegated      Failed - Urine Drug Screen completed in last 360 days.      Passed - Valid encounter within last 6 months    Recent Outpatient Visits           1 month ago Type 2 diabetes mellitus with stage 2 chronic kidney disease, without long-term current use of insulin ([place])   [place] Elsie Stain, MD   10 months ago Viral respiratory illness   [place], Deborah B, MD   1 year ago Primary osteoarthritis involving multiple joints   [place], Deborah B, MD   1 year ago Primary osteoarthritis involving multiple joints   [place], Deborah B, MD   1 year ago Type 2 diabetes, controlled, with peripheral neuropathy [facility])   [place], MD       Future Appointments             In 1 week Joya Gaskins Burnett Harry, MD [place]

## 2020-05-31 ENCOUNTER — Other Ambulatory Visit: Payer: Self-pay | Admitting: Critical Care Medicine

## 2020-05-31 MED FILL — traMADol HCL 50 MG TABS: 50 | 13 days supply | Qty: 40 | Fill #0
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
## 2020-06-07 ENCOUNTER — Ambulatory Visit: Payer: Medicare Other | Attending: Critical Care Medicine | Admitting: Critical Care Medicine

## 2020-06-07 ENCOUNTER — Other Ambulatory Visit: Payer: Self-pay | Admitting: Critical Care Medicine

## 2020-06-07 ENCOUNTER — Encounter: Payer: Self-pay | Admitting: Critical Care Medicine

## 2020-06-07 ENCOUNTER — Other Ambulatory Visit: Payer: Self-pay

## 2020-06-07 VITALS — BP 127/74 | Temp 97.2°F | Wt 201.0 lb

## 2020-06-07 DIAGNOSIS — M25551 Pain in right hip: Secondary | ICD-10-CM

## 2020-06-07 DIAGNOSIS — E1122 Type 2 diabetes mellitus with diabetic chronic kidney disease: Secondary | ICD-10-CM | POA: Diagnosis not present

## 2020-06-07 DIAGNOSIS — R269 Unspecified abnormalities of gait and mobility: Secondary | ICD-10-CM

## 2020-06-07 DIAGNOSIS — N182 Chronic kidney disease, stage 2 (mild): Secondary | ICD-10-CM

## 2020-06-07 DIAGNOSIS — N3941 Urge incontinence: Secondary | ICD-10-CM

## 2020-06-07 DIAGNOSIS — E1142 Type 2 diabetes mellitus with diabetic polyneuropathy: Secondary | ICD-10-CM

## 2020-06-07 DIAGNOSIS — G5603 Carpal tunnel syndrome, bilateral upper limbs: Secondary | ICD-10-CM

## 2020-06-07 DIAGNOSIS — E1169 Type 2 diabetes mellitus with other specified complication: Secondary | ICD-10-CM

## 2020-06-07 DIAGNOSIS — E785 Hyperlipidemia, unspecified: Secondary | ICD-10-CM

## 2020-06-07 DIAGNOSIS — G8929 Other chronic pain: Secondary | ICD-10-CM

## 2020-06-07 DIAGNOSIS — R030 Elevated blood-pressure reading, without diagnosis of hypertension: Secondary | ICD-10-CM

## 2020-06-07 DIAGNOSIS — I10 Essential (primary) hypertension: Secondary | ICD-10-CM

## 2020-06-07 DIAGNOSIS — M8949 Other hypertrophic osteoarthropathy, multiple sites: Secondary | ICD-10-CM

## 2020-06-07 DIAGNOSIS — M159 Polyosteoarthritis, unspecified: Secondary | ICD-10-CM

## 2020-06-07 DIAGNOSIS — M25559 Pain in unspecified hip: Secondary | ICD-10-CM

## 2020-06-07 LAB — POCT URINALYSIS DIP (CLINITEK)
Bilirubin, UA: NEGATIVE
Blood, UA: NEGATIVE
Glucose, UA: 500 mg/dL — AB
Ketones, POC UA: NEGATIVE mg/dL
Leukocytes, UA: NEGATIVE
Nitrite, UA: NEGATIVE
POC PROTEIN,UA: NEGATIVE
Spec Grav, UA: 1.01 (ref 1.010–1.025)
Urobilinogen, UA: 0.2 E.U./dL
pH, UA: 6.5 (ref 5.0–8.0)

## 2020-06-07 MED ORDER — TRAMADOL HCL 50 MG PO TABS: 50.0000 mg | ORAL_TABLET | Freq: Three times a day (TID) | ORAL | 0 refills | Status: DC | PRN

## 2020-06-07 MED ORDER — ATORVASTATIN CALCIUM 20 MG PO TABS
20.0000 mg | ORAL_TABLET | Freq: Every day | ORAL | 3 refills | Status: DC
Start: 2020-06-07 — End: 2020-09-28

## 2020-06-07 MED ORDER — VITAMIN B-12 500 MCG PO TABS: 500.0000 ug | ORAL_TABLET | Freq: Every day | ORAL | 2 refills | Status: DC

## 2020-06-07 MED ORDER — METOPROLOL SUCCINATE ER 25 MG PO TB24
25.0000 mg | ORAL_TABLET | Freq: Every day | ORAL | 1 refills | Status: DC
Start: 2020-06-07 — End: 2020-08-03

## 2020-06-07 MED ORDER — VITAMIN D 125 MCG (5000 UT) PO CAPS
1.0000 | ORAL_CAPSULE | Freq: Every day | ORAL | 1 refills | Status: DC
Start: 2020-06-07 — End: 2021-05-02

## 2020-06-07 MED ORDER — EMPAGLIFLOZIN 25 MG PO TABS
25.0000 mg | ORAL_TABLET | Freq: Every day | ORAL | 3 refills | Status: DC
Start: 2020-06-07 — End: 2020-08-03

## 2020-06-07 MED ORDER — LISINOPRIL 5 MG PO TABS: 5.0000 mg | ORAL_TABLET | Freq: Every day | ORAL | 2 refills | Status: DC

## 2020-06-07 MED ORDER — GABAPENTIN 300 MG PO CAPS: ORAL_CAPSULE | ORAL | 3 refills | Status: DC

## 2020-06-07 MED FILL — GABAPENTIN 300 MG CAPSULE: 300 | 23 days supply | Qty: 90 | Fill #0

## 2020-06-07 MED FILL — ATORVASTATIN CALCIUM 20 MG: 20 | 90 days supply | Qty: 90 | Fill #0

## 2020-06-07 MED FILL — METOPROLOL SUCCINATE ER 25: 25 | 30 days supply | Qty: 30 | Fill #0

## 2020-06-07 MED FILL — JARDIANCE 25 MG TABLET: 25 | 30 days supply | Qty: 30 | Fill #0

## 2020-06-07 MED FILL — LISINOPRIL 5 MG TABLET: 5 | 90 days supply | Qty: 90 | Fill #0

## 2020-06-07 MED FILL — traMADol HCL 50 MG TABS: 50 | 13 days supply | Qty: 40 | Fill #0

## 2020-06-07 NOTE — Assessment & Plan Note (Signed)
Refill Lipitor and obtain liver function panel

## 2020-06-07 NOTE — Assessment & Plan Note (Signed)
Type 2 diabetes with chronic kidney disease follow-up renal panel and refill Jardiance but increase dose to 25 mg daily  Note UA shows glycosuria

## 2020-06-07 NOTE — Assessment & Plan Note (Signed)
Patient was asked to use her Rollator on a more regular basis

## 2020-06-07 NOTE — Progress Notes (Signed)
Experiencing urinary frequency

## 2020-06-07 NOTE — Assessment & Plan Note (Signed)
High risk for falls with multiple arthritic changes will use Rollator more often and refer to physical therapy

## 2020-06-07 NOTE — Patient Instructions (Signed)
All medications refilled.  Increase Jardiance 25mg  daily  No other changes   A physical therapy referral was made  Return Dr Joya Gaskins 2 months

## 2020-06-07 NOTE — Assessment & Plan Note (Signed)
Hypertension well controlled at this time will refill all blood pressure medications including metoprolol 25 mg daily and lisinopril 5 mg daily and obtain a complete metabolic panel

## 2020-06-08 LAB — COMPREHENSIVE METABOLIC PANEL
ALT: 18 IU/L (ref 0–32)
AST: 12 IU/L (ref 0–40)
Albumin/Globulin Ratio: 1.7 (ref 1.2–2.2)
Albumin: 4.3 g/dL (ref 3.7–4.7)
Alkaline Phosphatase: 93 IU/L (ref 44–121)
BUN/Creatinine Ratio: 13 (ref 12–28)
BUN: 24 mg/dL (ref 8–27)
Bilirubin Total: 0.3 mg/dL (ref 0.0–1.2)
CO2: 23 mmol/L (ref 20–29)
Calcium: 9.3 mg/dL (ref 8.7–10.3)
Chloride: 100 mmol/L (ref 96–106)
Creatinine, Ser: 1.92 mg/dL — ABNORMAL HIGH (ref 0.57–1.00)
GFR calc Af Amer: 29 mL/min/{1.73_m2} — ABNORMAL LOW (ref >59)
GFR calc non Af Amer: 25 mL/min/{1.73_m2} — ABNORMAL LOW (ref >59)
Globulin, Total: 2.5 g/dL (ref 1.5–4.5)
Glucose: 127 mg/dL — ABNORMAL HIGH (ref 65–99)
Potassium: 4.6 mmol/L (ref 3.5–5.2)
Sodium: 138 mmol/L (ref 134–144)
Total Protein: 6.8 g/dL (ref 6.0–8.5)

## 2020-06-15 ENCOUNTER — Encounter: Payer: Self-pay | Admitting: Critical Care Medicine

## 2020-06-16 NOTE — Progress Notes (Signed)
Patient ID: Donna Nguyen, female   DOB: 13-May-1941, 79 y.o.   MRN: [id number] This is a 79 year old female seen in the [facility] clinic today her main concern is that she had a somewhat acute onset of generalized pain a week ago but appears to have resolved unclear as to the etiology she denies any fever. She does have frequent urination but this is a chronic condition. Today we primarily discussed social issues and I discussed the ongoing importance for her to get housing and be out of the shelter. She has a plan to get a camper and live in a trailer type environment we will see if this plan will work no medication changes were made at this visit

## 2020-06-22 ENCOUNTER — Other Ambulatory Visit: Payer: Self-pay | Admitting: Critical Care Medicine

## 2020-06-22 ENCOUNTER — Encounter: Payer: Self-pay | Admitting: Critical Care Medicine

## 2020-06-22 MED ORDER — TRAMADOL HCL 50 MG PO TABS: 50.0000 mg | ORAL_TABLET | Freq: Three times a day (TID) | ORAL | 0 refills | Status: DC | PRN

## 2020-06-22 MED FILL — traMADol HCL 50 MG TABS: 50 | 13 days supply | Qty: 40 | Fill #0

## 2020-06-22 NOTE — Progress Notes (Signed)
Refills

## 2020-06-23 NOTE — Progress Notes (Signed)
Patient ID: Donna Nguyen, female   DOB: Jun 19, 1941, 79 y.o.   MRN: [id number] This patient was seen in return follow-up with a [facility] clinic she is here for medication reconciliation and pill organizer to be set up.  She states she is looking into housing in the [place] [employer] [place].  She needs refills today on her tramadol.  I did look at the [facility] drug database and reviewed this.  It does not appear she is overusing the medication.  Her pill organizer was completely arranged for her no other refills are indicated at this time she will pick up her tramadol

## 2020-06-28 MED FILL — GABAPENTIN 300 MG CAPSULE: 300 | 23 days supply | Qty: 90 | Fill #1

## 2020-06-29 ENCOUNTER — Encounter: Payer: Self-pay | Admitting: Critical Care Medicine
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
## 2020-06-30 ENCOUNTER — Encounter: Payer: Self-pay | Admitting: Critical Care Medicine

## 2020-06-30 LAB — HM DIABETES EYE EXAM

## 2020-07-04 ENCOUNTER — Encounter: Payer: Self-pay | Admitting: Critical Care Medicine

## 2020-07-04 DIAGNOSIS — E113299 Type 2 diabetes mellitus with mild nonproliferative diabetic retinopathy without macular edema, unspecified eye: Secondary | ICD-10-CM | POA: Insufficient documentation

## 2020-07-06 ENCOUNTER — Encounter: Payer: Self-pay | Admitting: Critical Care Medicine

## 2020-07-06 ENCOUNTER — Other Ambulatory Visit: Payer: Self-pay

## 2020-07-06 ENCOUNTER — Other Ambulatory Visit: Payer: Self-pay | Admitting: Critical Care Medicine

## 2020-07-06 MED ORDER — TRAMADOL HCL 50 MG PO TABS: 50.0000 mg | ORAL_TABLET | Freq: Three times a day (TID) | ORAL | 0 refills | Status: DC | PRN

## 2020-07-06 MED FILL — JARDIANCE 25 MG TABLET: 25 | 30 days supply | Qty: 30 | Fill #1

## 2020-07-06 MED FILL — METOPROLOL SUCCINATE ER 25: 25 | 30 days supply | Qty: 30 | Fill #1

## 2020-07-06 MED FILL — traMADol HCL 50 MG TABS: 50 | 13 days supply | Qty: 40 | Fill #0

## 2020-07-06 NOTE — Telephone Encounter (Signed)
Requested medication (s) are due for refill today-yes  Requested medication (s) are on the active medication list -yes  Future visit scheduled -no  Last refill: 06/22/20  Notes to clinic: Request RF non delegated Rx  Requested Prescriptions  Pending Prescriptions Disp Refills   traMADol (ULTRAM) 50 MG tablet [Pharmacy Med Name: traMADol HCL 50 MG TABS 50 Tablet] 40 tablet 0    Sig: Take 1 tablet (50 mg total) by mouth every 8 (eight) hours as needed for severe pain.      Not Delegated - Analgesics:  Opioid Agonists Failed - 07/06/2020  9:00 AM      Failed - This refill cannot be delegated      Failed - Urine Drug Screen completed in last 360 days      Passed - Valid encounter within last 6 months    Recent Outpatient Visits           4 weeks ago Type 2 diabetes mellitus with stage 2 chronic kidney disease, without long-term current use of insulin ([place])   [place] Elsie Stain, MD   2 months ago Type 2 diabetes mellitus with stage 2 chronic kidney disease, without long-term current use of insulin ([place])   [place], Patrick E, MD   11 months ago Viral respiratory illness   [place] Ladell Pier, MD   1 year ago Primary osteoarthritis involving multiple joints   [place] Ladell Pier, MD   1 year ago Primary osteoarthritis involving multiple joints   [place], MD                  Requested Prescriptions  Pending Prescriptions Disp Refills   traMADol (ULTRAM) 50 MG tablet [Pharmacy Med Name: traMADol HCL 50 MG TABS 50 Tablet] 40 tablet 0    Sig: Take 1 tablet (50 mg total) by mouth every 8 (eight) hours as needed for severe pain.      Not Delegated - Analgesics:  Opioid Agonists Failed - 07/06/2020  9:00 AM      Failed - This refill cannot be delegated      Failed  - Urine Drug Screen completed in last 360 days      Passed - Valid encounter within last 6 months    Recent Outpatient Visits           4 weeks ago Type 2 diabetes mellitus with stage 2 chronic kidney disease, without long-term current use of insulin ([place])   [place] Elsie Stain, MD   2 months ago Type 2 diabetes mellitus with stage 2 chronic kidney disease, without long-term current use of insulin ([place])   [place] Elsie Stain, MD   11 months ago Viral respiratory illness   [place] Ladell Pier, MD   1 year ago Primary osteoarthritis involving multiple joints   [place] Ladell Pier, MD   1 year ago Primary osteoarthritis involving multiple joints   [facility] Ladell Pier, MD

## 2020-07-07 NOTE — Progress Notes (Signed)
This patient was seen briefly at the [facility] clinic she needed refills on her tramadol which are processed she is currently dealing with decision making she needs to make regarding giving up some of her possessions so she can move into an assisted living or a smaller apartment.  I spent some time talking to her about this and helping her to process the need that she will have to give up some of her possessions in order to downsize and be able to move out of the shelter.  She we also discussed the fact that her right hip needs to be replaced and she does have existing appointments with orthopedics and she is encouraged to follow-up on this.

## 2020-07-13 ENCOUNTER — Other Ambulatory Visit: Payer: Self-pay | Admitting: Critical Care Medicine

## 2020-07-20 ENCOUNTER — Other Ambulatory Visit: Payer: Self-pay | Admitting: Critical Care Medicine

## 2020-07-20 ENCOUNTER — Encounter: Payer: Self-pay | Admitting: Critical Care Medicine

## 2020-07-20 ENCOUNTER — Telehealth: Payer: Self-pay | Admitting: Critical Care Medicine

## 2020-07-20 DIAGNOSIS — C4491 Basal cell carcinoma of skin, unspecified: Secondary | ICD-10-CM

## 2020-07-20 MED ORDER — CITALOPRAM HYDROBROMIDE 20 MG PO TABS: 20.0000 mg | ORAL_TABLET | Freq: Every day | ORAL | 3 refills | Status: DC

## 2020-07-20 MED ORDER — TRAMADOL HCL 50 MG PO TABS: 50.0000 mg | ORAL_TABLET | Freq: Three times a day (TID) | ORAL | 0 refills | Status: DC | PRN

## 2020-07-20 MED FILL — CITALOPRAM HBR 20 MG TABLET: 20 | 30 days supply | Qty: 30 | Fill #0

## 2020-07-20 MED FILL — traMADol HCL 50 MG TABS: 50 | 13 days supply | Qty: 40 | Fill #0

## 2020-07-20 NOTE — Telephone Encounter (Signed)
1) Medication(s) Requested (by name): traMADol (ULTRAM) 50 MG tablet  2) Pharmacy of Choice: [facility]   3) Special Requests: URGENT

## 2020-07-20 NOTE — Progress Notes (Signed)
Patient ID: Donna Nguyen, female   DOB: 1940/11/20, 79 y.o.   MRN: [id number] This patient is a 79 year old female seen in the [facility] [facility] clinic with a history of hypertension, diabetes, hyperlipidemia, carpal tunnel syndrome, homelessness, memory loss, hoarding behavior, and now recurrent symptoms of major depressive disorder.  Patient's had increased depression and sadness while being in the shelter during the holiday.  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

## 2020-07-20 NOTE — Telephone Encounter (Signed)
done

## 2020-07-20 NOTE — Progress Notes (Signed)
Med refills

## 2020-07-21 ENCOUNTER — Telehealth: Payer: Self-pay | Admitting: Licensed Clinical Social Worker

## 2020-07-21 NOTE — Telephone Encounter (Signed)
Call placed to patient regarding [facility] referral. LCSW was unable to leave a return message due to voicemail not being set up at this time.

## 2020-07-27 ENCOUNTER — Encounter: Payer: Self-pay | Admitting: Critical Care Medicine

## 2020-07-27 NOTE — Progress Notes (Signed)
Patient ID: Donna Nguyen, female   DOB: [birth year], 79 y.o.   MRN: [id number] This a 79 year old female seen in return at the [facility] clinic only to catch up on several items.  She want to tell me that the assistant that counseled her last week was immensely helpful and the citalopram also is helped her nerves she is wanting to have her blood pressure rechecked it is 126/58 we discussed other social issues and I told her she should stay on her tramadol and other medications without change at this time

## 2020-08-03 ENCOUNTER — Other Ambulatory Visit: Payer: Self-pay | Admitting: Critical Care Medicine

## 2020-08-03 ENCOUNTER — Encounter: Payer: Self-pay | Admitting: Critical Care Medicine

## 2020-08-03 MED ORDER — EMPAGLIFLOZIN 25 MG PO TABS
25.0000 mg | ORAL_TABLET | Freq: Every day | ORAL | 3 refills | Status: DC
Start: 2020-08-03 — End: 2020-09-28

## 2020-08-03 MED ORDER — METOPROLOL SUCCINATE ER 25 MG PO TB24
25.0000 mg | ORAL_TABLET | Freq: Every day | ORAL | 1 refills | Status: DC
Start: 2020-08-03 — End: 2020-11-21

## 2020-08-03 MED ORDER — TRAMADOL HCL 50 MG PO TABS: 50.0000 mg | ORAL_TABLET | Freq: Three times a day (TID) | ORAL | 0 refills | Status: DC | PRN

## 2020-08-03 MED FILL — traMADol HCL 50 MG TABS: 50 | 30 days supply | Qty: 90 | Fill #0

## 2020-08-03 MED FILL — JARDIANCE 25 MG TABLET: 25 | 30 days supply | Qty: 30 | Fill #0

## 2020-08-03 MED FILL — METOPROLOL SUCCINATE ER 25: 25 | 30 days supply | Qty: 30 | Fill #0

## 2020-08-03 NOTE — Progress Notes (Signed)
Patient ID: Donna Nguyen, female   DOB: 03-Jul-1941, 79 y.o.   MRN: [id number] This is a 79 year old female seen in the [facility] clinic today she needs refills on metoprolol Jardiance and tramadol  On exam blood pressure is 138/60 pulse 61 saturation 98% room air blood glucose is 156 patient is examined and shows no new findings  Pression is diabetes and hypertension chronic pain  Refills were sent to her local pharmacy she will pick up her medications

## 2020-08-05 ENCOUNTER — Telehealth: Payer: Self-pay | Admitting: Licensed Clinical Social Worker

## 2020-08-05 NOTE — Telephone Encounter (Signed)
Call placed to patient regarding [facility] referral. LCSW was unable to leave a return message due to voicemail not being set up at this time.

## 2020-08-10 ENCOUNTER — Other Ambulatory Visit: Payer: Self-pay | Admitting: Critical Care Medicine

## 2020-08-10 ENCOUNTER — Encounter: Payer: Self-pay | Admitting: Critical Care Medicine

## 2020-08-10 DIAGNOSIS — E1142 Type 2 diabetes mellitus with diabetic polyneuropathy: Secondary | ICD-10-CM

## 2020-08-10 DIAGNOSIS — S0990XD Unspecified injury of head, subsequent encounter: Secondary | ICD-10-CM

## 2020-08-10 DIAGNOSIS — R413 Other amnesia: Secondary | ICD-10-CM

## 2020-08-10 MED ORDER — GABAPENTIN 300 MG PO CAPS: ORAL_CAPSULE | ORAL | 3 refills | Status: DC

## 2020-08-10 MED ORDER — SEMAGLUTIDE 3 MG PO TABS: ORAL_TABLET | ORAL | 0 refills | Status: DC

## 2020-08-10 MED ORDER — CITALOPRAM HYDROBROMIDE 20 MG PO TABS
20.0000 mg | ORAL_TABLET | Freq: Every day | ORAL | 3 refills | Status: DC
Start: 2020-08-10 — End: 2020-09-28

## 2020-08-10 MED FILL — GABAPENTIN 300 MG CAPSULE: 300 | 22 days supply | Qty: 90 | Fill #0

## 2020-08-10 MED FILL — RYBELSUS 3 MG TABS: 3 | 30 days supply | Qty: 30 | Fill #0
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
## 2020-08-15 ENCOUNTER — Ambulatory Visit: Payer: Medicare Other | Admitting: Podiatry

## 2020-08-18 MED FILL — GABAPENTIN 300 MG CAPSULE: 300 | 22 days supply | Qty: 90 | Fill #0

## 2020-08-18 MED FILL — RYBELSUS 3 MG TABS: 3 | 30 days supply | Qty: 30 | Fill #0

## 2020-08-19 MED FILL — CITALOPRAM HBR 20 MG TABLET: 20 | 30 days supply | Qty: 30 | Fill #0
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
## 2020-08-23 ENCOUNTER — Ambulatory Visit: Payer: Medicare Other | Attending: Critical Care Medicine | Admitting: Critical Care Medicine

## 2020-08-23 ENCOUNTER — Encounter: Payer: Self-pay | Admitting: Critical Care Medicine

## 2020-08-23 ENCOUNTER — Other Ambulatory Visit: Payer: Self-pay

## 2020-08-23 VITALS — BP 132/76 | HR 66 | Temp 98.0°F | Resp 24 | Wt 199.0 lb

## 2020-08-23 DIAGNOSIS — E1122 Type 2 diabetes mellitus with diabetic chronic kidney disease: Secondary | ICD-10-CM

## 2020-08-23 DIAGNOSIS — R3589 Other polyuria: Secondary | ICD-10-CM | POA: Insufficient documentation

## 2020-08-23 DIAGNOSIS — F331 Major depressive disorder, recurrent, moderate: Secondary | ICD-10-CM | POA: Diagnosis not present

## 2020-08-23 DIAGNOSIS — N182 Chronic kidney disease, stage 2 (mild): Secondary | ICD-10-CM

## 2020-08-23 NOTE — Assessment & Plan Note (Signed)
Likely on the basis of urge incontinence and hyperglycemia  Check urinalysis

## 2020-08-23 NOTE — Patient Instructions (Signed)
Begin the semaglutide daily Stay on Jardiance daily  No change in other medications  Labs today to check your urine,  metabolic profile, and blood counts  Return to Dr. Delford Field 3 months here at the clinic and Dr. Delford Field will follow you also weekly at the shelter

## 2020-08-23 NOTE — Assessment & Plan Note (Signed)
Type 2 diabetes with chronic kidney disease neurologic manifestations with neuropathy and hyperlipidemia  Not well controlled at this time with hemoglobin A1c of 7.8  Begin oral semaglutide 3 mg daily and continue Jardiance at 25 mg daily  The patient wishes to avoid insulin as long as possible and also wish to avoid needles as long as

## 2020-08-23 NOTE — Assessment & Plan Note (Signed)
Continue Celexa

## 2020-08-23 NOTE — Progress Notes (Signed)
Feels equilibrium is getting worse. Frequent urination at night  Increased number of BM, at least 3-4 daily

## 2020-08-24 ENCOUNTER — Other Ambulatory Visit: Payer: Self-pay | Admitting: Critical Care Medicine

## 2020-08-24 ENCOUNTER — Encounter: Payer: Self-pay | Admitting: Critical Care Medicine

## 2020-08-24 DIAGNOSIS — C44319 Basal cell carcinoma of skin of other parts of face: Secondary | ICD-10-CM

## 2020-08-24 LAB — URINALYSIS
Bilirubin, UA: NEGATIVE
Ketones, UA: NEGATIVE
Leukocytes,UA: NEGATIVE
Nitrite, UA: NEGATIVE
RBC, UA: NEGATIVE
Specific Gravity, UA: >=1.03 — AB (ref 1.005–1.030)
Urobilinogen, Ur: 0.2 mg/dL (ref 0.2–1.0)
pH, UA: 6 (ref 5.0–7.5)

## 2020-08-24 LAB — CBC WITH DIFFERENTIAL/PLATELET
Basophils Absolute: 0 10*3/uL (ref 0.0–0.2)
Basos: 0 %
EOS (ABSOLUTE): 0.2 10*3/uL (ref 0.0–0.4)
Eos: 2 %
Hematocrit: 42.9 % (ref 34.0–46.6)
Hemoglobin: 14 g/dL (ref 11.1–15.9)
Immature Grans (Abs): 0 10*3/uL (ref 0.0–0.1)
Immature Granulocytes: 0 %
Lymphocytes Absolute: 6.3 10*3/uL — ABNORMAL HIGH (ref 0.7–3.1)
Lymphs: 66 %
MCH: 28.3 pg (ref 26.6–33.0)
MCHC: 32.6 g/dL (ref 31.5–35.7)
MCV: 87 fL (ref 79–97)
Monocytes Absolute: 0.6 10*3/uL (ref 0.1–0.9)
Monocytes: 7 %
Neutrophils Absolute: 2.4 10*3/uL (ref 1.4–7.0)
Neutrophils: 25 %
Platelets: 208 10*3/uL (ref 150–450)
RBC: 4.94 x10E6/uL (ref 3.77–5.28)
RDW: 13.3 % (ref 11.7–15.4)
WBC: 9.5 10*3/uL (ref 3.4–10.8)

## 2020-08-24 LAB — COMPREHENSIVE METABOLIC PANEL WITH GFR
ALT: 17 [IU]/L (ref 0–32)
AST: 19 [IU]/L (ref 0–40)
Albumin/Globulin Ratio: 2 (ref 1.2–2.2)
Albumin: 4.1 g/dL (ref 3.7–4.7)
Alkaline Phosphatase: 81 [IU]/L (ref 44–121)
BUN/Creatinine Ratio: 16 (ref 12–28)
BUN: 26 mg/dL (ref 8–27)
Bilirubin Total: 0.2 mg/dL (ref 0.0–1.2)
CO2: 23 mmol/L (ref 20–29)
Calcium: 8.9 mg/dL (ref 8.7–10.3)
Chloride: 103 mmol/L (ref 96–106)
Creatinine, Ser: 1.66 mg/dL — ABNORMAL HIGH (ref 0.57–1.00)
GFR calc Af Amer: 34 mL/min/{1.73_m2} — ABNORMAL LOW
GFR calc non Af Amer: 29 mL/min/{1.73_m2} — ABNORMAL LOW
Globulin, Total: 2.1 g/dL (ref 1.5–4.5)
Glucose: 98 mg/dL (ref 65–99)
Potassium: 4.7 mmol/L (ref 3.5–5.2)
Sodium: 140 mmol/L (ref 134–144)
Total Protein: 6.2 g/dL (ref 6.0–8.5)

## 2020-08-24 LAB — POCT GLYCOSYLATED HEMOGLOBIN (HGB A1C): HbA1c, POC (controlled diabetic range): 7.6 % — AB (ref 0.0–7.0)

## 2020-08-24 LAB — GLUCOSE, POCT (MANUAL RESULT ENTRY): POC Glucose: 109 mg/dl — AB (ref 70–99)

## 2020-08-24 NOTE — Progress Notes (Signed)
Patient ID: Donna Nguyen, female   DOB: 12/08/1940, 80 y.o.   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

## 2020-08-24 NOTE — Progress Notes (Signed)
Referral to dermatology.  

## 2020-08-24 NOTE — Progress Notes (Signed)
Patient has a voicemail that has not been setup yet.

## 2020-08-29 ENCOUNTER — Other Ambulatory Visit: Payer: Self-pay | Admitting: Critical Care Medicine

## 2020-08-29 NOTE — Telephone Encounter (Signed)
Pt called asking for an early refill on the tramadol.  She said she is having bad pain in her hands and is completely out.

## 2020-08-29 NOTE — Telephone Encounter (Signed)
Requested medication (s) are due for refill today:   Provider to determine  Requested medication (s) are on the active medication list:   Yes  Future visit scheduled:   Yes   Last ordered: 08/03/2020 #90, 0 refills  Non delegated refill   Requested Prescriptions  Pending Prescriptions Disp Refills   traMADol (ULTRAM) 50 MG tablet [Pharmacy Med Name: traMADol HCL 50 MG TABS 50 Tablet] 90 tablet 0    Sig: Take 1 tablet (50 mg total) by mouth every 8 (eight) hours as needed for severe pain.      Not Delegated - Analgesics:  Opioid Agonists Failed - 08/29/2020  9:59 AM      Failed - This refill cannot be delegated      Failed - Urine Drug Screen completed in last 360 days      Passed - Valid encounter within last 6 months    Recent Outpatient Visits           6 days ago Type 2 diabetes mellitus with stage 2 chronic kidney disease, without long-term current use of insulin ([place])   [place] Elsie Stain, MD   2 months ago Type 2 diabetes mellitus with stage 2 chronic kidney disease, without long-term current use of insulin ([place])   [place] Elsie Stain, MD   4 months ago Type 2 diabetes mellitus with stage 2 chronic kidney disease, without long-term current use of insulin ([place])   [place] Elsie Stain, MD   1 year ago Viral respiratory illness   [place], MD   1 year ago Primary osteoarthritis involving multiple joints   [place], MD       Future Appointments             In 2 months Joya Gaskins [place] Harry, MD [place]

## 2020-08-31 ENCOUNTER — Encounter: Payer: Self-pay | Admitting: Critical Care Medicine

## 2020-08-31 NOTE — Progress Notes (Signed)
Patient ID: Donna Nguyen, female   DOB: 10-Dec-1940, 80 y.o.   MRN: [id number] This patient was seen today in the [place] clinic she wanted to be confirmed that her medications were [facility] indicated her they were are there and available including the tramadol and gabapentin  She had missed her dermatology appointment because her phone is broken she cannot get a phone call from them  I called the dermatologist office and obtain an appointment for her on January 21 and she will arrive for that appointment as she likely has a basal cell carcinoma left face no other concerns were voiced

## 2020-09-01 MED FILL — traMADol HCL 50 MG TABS: 50 | 30 days supply | Qty: 90 | Fill #0

## 2020-09-05 ENCOUNTER — Encounter: Payer: Self-pay | Admitting: *Deleted

## 2020-09-07 ENCOUNTER — Encounter: Payer: Self-pay | Admitting: Critical Care Medicine

## 2020-09-07 ENCOUNTER — Other Ambulatory Visit: Payer: Self-pay | Admitting: Critical Care Medicine

## 2020-09-07 NOTE — Progress Notes (Signed)
80 year old female seen in return visit at the [facility] clinic this patient has been seen weekly she is today checking to see if she needs refills reviewed all her medications indicated which meds she is to call in for refills  Note blood sugar was 153 blood pressure 170/80 saturation 97% pulse 77  Exam is unchanged  Patient has been notified as to which medications to pick up and she is yet to start the semaglutide she is instructed to begin this

## 2020-09-09 ENCOUNTER — Other Ambulatory Visit: Payer: Self-pay | Admitting: Dermatology

## 2020-09-09 DIAGNOSIS — L570 Actinic keratosis: Secondary | ICD-10-CM | POA: Diagnosis not present

## 2020-09-09 MED FILL — FLUOROURACIL 5 % CREA: 5 | 30 days supply | Qty: 40 | Fill #0

## 2020-09-14 ENCOUNTER — Encounter: Payer: Self-pay | Admitting: Critical Care Medicine

## 2020-09-14 MED FILL — GABAPENTIN 300 MG CAPSULE: 300 | 22 days supply | Qty: 90 | Fill #1

## 2020-09-14 MED FILL — LISINOPRIL 5 MG TABLET: 5 | 30 days supply | Qty: 30 | Fill #1

## 2020-09-14 MED FILL — CITALOPRAM HBR 20 MG TABLET: 20 | 30 days supply | Qty: 30 | Fill #1

## 2020-09-14 MED FILL — ATORVASTATIN CALCIUM 20 MG: 20 | 30 days supply | Qty: 30 | Fill #1

## 2020-09-14 NOTE — Progress Notes (Signed)
This is a 80 year old female seen today in the [facility] clinic the patient was needing refills on her medications we brought the medicines to her on exam temp is 95 saturation 98% room air pulse 105 blood sugar was 154  The staff at the shelter notes this patient is exhibiting increasingly severe hoarding behavior.  She has been found in the kitchen area trying to hoard items from the kitchen.  She is not obeying some of the rules such as leaving the lobby for 2 hours to allow cleaning to occur in the lobby for Covid protocols.  Apparently her vehicle has been filled with items with her hoarding behavior and the staff states there are live mice in the car itself and I have found myself up in the dormitory area near her bed  Patient had about a weeks worth of upper respiratory illnesses which is now resolved.  The patient did not seek any Covid testing.  Exam is unremarkable at this time  Impression is that of progressive hoarding behavior severe anxiety disorder and now disruptive behavior in the shelter  Diabetes type 2 and hypertension  Plan for this patient is to continue her current medication program including Celexa for her anxiety Zestril for blood pressure she will continue the Jardiance and begin the semaglutide refills were given  I plan to partner with our new nurse to see about getting this patient care with mental health

## 2020-09-15 ENCOUNTER — Other Ambulatory Visit: Payer: Medicare Other

## 2020-09-21 ENCOUNTER — Encounter: Payer: Self-pay | Admitting: Critical Care Medicine

## 2020-09-22 ENCOUNTER — Telehealth: Payer: Self-pay | Admitting: *Deleted

## 2020-09-22 NOTE — Telephone Encounter (Signed)
Ok , she is high risk.  She may end up in hospital ED this weekend  Will see

## 2020-09-22 NOTE — Telephone Encounter (Signed)
Spoke w/ msw students to assist. Pt stated she does not feel well this day, left her medicine in her car and is suffering with pain. Informed her we will touch base Monday. Also spoke w/ msw students.

## 2020-09-22 NOTE — Progress Notes (Signed)
Patient ID: Donna Nguyen, female   DOB: 07-25-1941, 80 y.o.   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  Patient also has multiple orthopedic issues.  Patient recently fell out of her bed and on history obtained she apparently took extra doses of her tramadol before going to bed and forgot that she took doses earlier in the evening.  On exam blood sugar is 160 exam otherwise is unremarkable  Impression here would be of overuse of tramadol patient was counseled in this regard.  I am planning to partner with my new nurse here at the clinic to see if we can get her at associated with mental health services

## 2020-09-28 ENCOUNTER — Encounter: Payer: Self-pay | Admitting: Critical Care Medicine

## 2020-09-28 ENCOUNTER — Other Ambulatory Visit: Payer: Self-pay | Admitting: Critical Care Medicine

## 2020-09-28 ENCOUNTER — Telehealth: Payer: Self-pay | Admitting: *Deleted

## 2020-09-28 DIAGNOSIS — R030 Elevated blood-pressure reading, without diagnosis of hypertension: Secondary | ICD-10-CM

## 2020-09-28 DIAGNOSIS — E1142 Type 2 diabetes mellitus with diabetic polyneuropathy: Secondary | ICD-10-CM

## 2020-09-28 MED ORDER — EMPAGLIFLOZIN 25 MG PO TABS: 25.0000 mg | ORAL_TABLET | Freq: Every day | ORAL | 3 refills | Status: DC

## 2020-09-28 MED ORDER — CITALOPRAM HYDROBROMIDE 20 MG PO TABS
20.0000 mg | ORAL_TABLET | Freq: Every day | ORAL | 3 refills | Status: DC
Start: 2020-09-28 — End: 2021-03-27

## 2020-09-28 MED ORDER — LISINOPRIL 5 MG PO TABS: 5.0000 mg | ORAL_TABLET | Freq: Every day | ORAL | 2 refills | Status: DC

## 2020-09-28 MED ORDER — GABAPENTIN 300 MG PO CAPS: ORAL_CAPSULE | ORAL | 3 refills | Status: DC

## 2020-09-28 MED ORDER — ATORVASTATIN CALCIUM 20 MG PO TABS: 20.0000 mg | ORAL_TABLET | Freq: Every day | ORAL | 3 refills | Status: DC

## 2020-09-28 MED ORDER — TRAMADOL HCL 50 MG PO TABS: 50.0000 mg | ORAL_TABLET | Freq: Three times a day (TID) | ORAL | 0 refills | Status: DC | PRN

## 2020-09-28 MED FILL — traMADol HCL 50 MG TABS: 50 | 30 days supply | Qty: 90 | Fill #0

## 2020-09-29 MED FILL — JARDIANCE 25 MG TABLET: 25 | 60 days supply | Qty: 60 | Fill #0

## 2020-09-29 NOTE — Progress Notes (Signed)
Patient ID: Donna Nguyen, female   DOB: 06-11-1941, 80 y.o.   MRN: [id number] This is a 80 year old female seen in the [facility] clinic she has run out of her tramadol needing refills.  Complains of multiple areas of achiness.  She is yet to start the semaglutide because concerned about side effects she is taking her Jardiance.  She is in need of refills on multiple medications at this time.  Plan is to refill all of her medications currently needed along with the tramadol  Drug database was checked for [facility] and no other prescriptions for opiates seen

## 2020-09-30 ENCOUNTER — Telehealth: Payer: Self-pay | Admitting: Critical Care Medicine

## 2020-09-30 NOTE — Telephone Encounter (Signed)
PT called back asking when her medication will be dropped off at [facility]. Please advise.
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
## 2020-10-03 ENCOUNTER — Other Ambulatory Visit: Payer: Self-pay | Admitting: *Deleted

## 2020-10-03 NOTE — Telephone Encounter (Signed)
Patient states that Bonnita Nasuti dropped medication off for her.  Was going to inform patient that we would not be able to accommodate this need for her.

## 2020-10-03 NOTE — Progress Notes (Signed)
Pt stated her car is not in working order, could RN pick up meds from [place], picked up tramadol and jardiance. She states she is not in desire of discussing meds, she states she knows she needs to take as directed. Will pick up gabapentin Monday at [facility]

## 2020-10-05 ENCOUNTER — Encounter: Payer: Self-pay | Admitting: Physician Assistant

## 2020-10-06 NOTE — Progress Notes (Signed)
Ms Cansler initially signed up to come into the clinic today.  She states she is in terrible pain.  She is under a great deal of stress today because of issues relating to having to move her car.  She will wait to see Dr Joya Gaskins next week. She was waiting for someone and did not have time to come in today.   Rosaria Ferries, PA-C 10/06/2020 12:42 PM

## 2020-10-12 ENCOUNTER — Encounter: Payer: Self-pay | Admitting: Critical Care Medicine

## 2020-10-12 ENCOUNTER — Encounter: Payer: Self-pay | Admitting: *Deleted

## 2020-10-12 ENCOUNTER — Other Ambulatory Visit: Payer: Self-pay | Admitting: Critical Care Medicine

## 2020-10-12 DIAGNOSIS — M8949 Other hypertrophic osteoarthropathy, multiple sites: Secondary | ICD-10-CM

## 2020-10-12 DIAGNOSIS — M159 Polyosteoarthritis, unspecified: Secondary | ICD-10-CM

## 2020-10-12 DIAGNOSIS — L244 Irritant contact dermatitis due to drugs in contact with skin: Secondary | ICD-10-CM | POA: Diagnosis not present

## 2020-10-12 MED FILL — ATORVASTATIN CALCIUM 20 MG: 20 | 90 days supply | Qty: 90 | Fill #0

## 2020-10-12 MED FILL — LISINOPRIL 5 MG TABLET: 5 | 90 days supply | Qty: 90 | Fill #0

## 2020-10-12 MED FILL — GABAPENTIN 300 MG CAPSULE: 300 | 22 days supply | Qty: 90 | Fill #0

## 2020-10-12 MED FILL — CITALOPRAM HBR 20 MG TABLET: 20 | 90 days supply | Qty: 90 | Fill #0

## 2020-10-12 NOTE — Telephone Encounter (Signed)
Requested medication (s) are due for refill today: yes  Requested medication (s) are on the active medication list: yes  Last refill:  09/28/2020  Future visit scheduled:yes  Notes to clinic:  this refill cannot be delegated   Requested Prescriptions  Pending Prescriptions Disp Refills   traMADol (ULTRAM) 50 MG tablet [Pharmacy Med Name: traMADol HCL 50 MG TABS 50 Tablet] 90 tablet 0    Sig: Take 1 tablet (50 mg total) by mouth every 8 (eight) hours as needed for severe pain.      Not Delegated - Analgesics:  Opioid Agonists Failed - 10/12/2020  7:58 AM      Failed - This refill cannot be delegated      Failed - Urine Drug Screen completed in last 360 days      Passed - Valid encounter within last 6 months    Recent Outpatient Visits           1 month ago Type 2 diabetes mellitus with stage 2 chronic kidney disease, without long-term current use of insulin ([place])   [place] Elsie Stain, MD   4 months ago Type 2 diabetes mellitus with stage 2 chronic kidney disease, without long-term current use of insulin ([place])   [place] Elsie Stain, MD   5 months ago Type 2 diabetes mellitus with stage 2 chronic kidney disease, without long-term current use of insulin ([place])   [place] Elsie Stain, MD   1 year ago Viral respiratory illness   [place], Deborah B, MD   1 year ago Primary osteoarthritis involving multiple joints   [place], MD       Future Appointments             In 1 month Joya Gaskins Burnett Harry, MD [place]

## 2020-10-13 NOTE — Progress Notes (Signed)
Patient ID: Donna Nguyen, female   DOB: 05/14/1941, 80 y.o.   MRN: [id number] This a 80 year old female seen in return follow-up at the [facility]  Today she is complaining of severe rash on the face after being treated for skin cancer.  It appears she has been over applying the 5 fluorouracil cream to the face and she has a severe rash which is malar in distribution  Patient also needs a statement given to the shelter that she is diabetic and needs nutritional supplies held behind the desk during the day.  She is currently not started the semaglutide  Below is a photograph from the rash  Plan is to get this patient back into physical therapy and also referral back to dermatology she has been told to hold the 5-fluorouracil only gave her an eczema cream to apply on the rash   Media Information         Document Information  Photos  Face reaction to 5 FU  10/12/2020 15:26  Attached To:  Elliot Dally Stash   Source Information  Elsie Stain, MD  [facility]

## 2020-10-26 ENCOUNTER — Encounter: Payer: Self-pay | Admitting: Critical Care Medicine

## 2020-10-26 NOTE — Progress Notes (Signed)
Patient ID: Donna Nguyen, female   DOB: 11/15/1940, 80 y.o.   MRN: [id number] This 80 year old female was seen again today at the [facility].  She is about to move out to a motel room tomorrow for housing.  Overall her face is better from the reaction to the 5-fluorouracil.  She states her pain overall is better as well.  On arrival blood pressure 152/80 pulse 65 saturation was 98% glucose was 103 she has been compliant with her Jardiance  Impression is that diabetes is improved overall pain management improved and reaction to 5-fluorouracil improved  Plan is to follow this patient up at the motel and at the clinic health and wellness

## 2020-11-03 ENCOUNTER — Other Ambulatory Visit: Payer: Self-pay | Admitting: Critical Care Medicine

## 2020-11-03 NOTE — Telephone Encounter (Signed)
Requested medication (s) are due for refill today: expired medication  Requested medication (s) are on the active medication list: no  Last refill:  09/29/19- 10/28/20 #90 0 refills  Future visit scheduled: yes in 2 weeks  Notes to clinic:  expired medication , not delegated per protocol     Requested Prescriptions  Pending Prescriptions Disp Refills   traMADol (ULTRAM) 50 MG tablet [Pharmacy Med Name: traMADol HCL 50 MG TABS 50 Tablet] 90 tablet 0    Sig: Take 1 tablet (50 mg total) by mouth every 8 (eight) hours as needed for severe pain.      Not Delegated - Analgesics:  Opioid Agonists Failed - 11/03/2020  3:35 PM      Failed - This refill cannot be delegated      Failed - Urine Drug Screen completed in last 360 days      Passed - Valid encounter within last 6 months    Recent Outpatient Visits           2 months ago Type 2 diabetes mellitus with stage 2 chronic kidney disease, without long-term current use of insulin ([place])   [place] Elsie Stain, MD   4 months ago Type 2 diabetes mellitus with stage 2 chronic kidney disease, without long-term current use of insulin ([place])   [place] Elsie Stain, MD   6 months ago Type 2 diabetes mellitus with stage 2 chronic kidney disease, without long-term current use of insulin ([place])   [place] Elsie Stain, MD   1 year ago Viral respiratory illness   Martinez, Deborah B, MD   1 year ago Primary osteoarthritis involving multiple joints   [place], Deborah B, MD       Future Appointments             In 2 weeks Joya Gaskins Burnett Harry, MD [place]

## 2020-11-04 ENCOUNTER — Other Ambulatory Visit: Payer: Self-pay | Admitting: Critical Care Medicine

## 2020-11-05 ENCOUNTER — Other Ambulatory Visit: Payer: Self-pay | Admitting: Critical Care Medicine
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
## 2020-11-21 ENCOUNTER — Encounter: Payer: Self-pay | Admitting: Critical Care Medicine

## 2020-11-21 ENCOUNTER — Ambulatory Visit: Payer: Medicare Other | Attending: Critical Care Medicine | Admitting: Critical Care Medicine

## 2020-11-21 ENCOUNTER — Other Ambulatory Visit: Payer: Self-pay

## 2020-11-21 VITALS — BP 145/74 | HR 89 | Ht 65.0 in | Wt 192.6 lb

## 2020-11-21 DIAGNOSIS — R39198 Other difficulties with micturition: Secondary | ICD-10-CM | POA: Diagnosis not present

## 2020-11-21 DIAGNOSIS — N182 Chronic kidney disease, stage 2 (mild): Secondary | ICD-10-CM

## 2020-11-21 DIAGNOSIS — I10 Essential (primary) hypertension: Secondary | ICD-10-CM

## 2020-11-21 DIAGNOSIS — F331 Major depressive disorder, recurrent, moderate: Secondary | ICD-10-CM

## 2020-11-21 DIAGNOSIS — R350 Frequency of micturition: Secondary | ICD-10-CM | POA: Diagnosis not present

## 2020-11-21 DIAGNOSIS — E1169 Type 2 diabetes mellitus with other specified complication: Secondary | ICD-10-CM | POA: Diagnosis not present

## 2020-11-21 DIAGNOSIS — E785 Hyperlipidemia, unspecified: Secondary | ICD-10-CM

## 2020-11-21 DIAGNOSIS — E1122 Type 2 diabetes mellitus with diabetic chronic kidney disease: Secondary | ICD-10-CM

## 2020-11-21 DIAGNOSIS — N3941 Urge incontinence: Secondary | ICD-10-CM

## 2020-11-21 LAB — POCT GLYCOSYLATED HEMOGLOBIN (HGB A1C): HbA1c, POC (controlled diabetic range): 7.4 % — AB (ref 0.0–7.0)

## 2020-11-21 LAB — GLUCOSE, POCT (MANUAL RESULT ENTRY): POC Glucose: 152 mg/dl — AB (ref 70–99)

## 2020-11-21 NOTE — Assessment & Plan Note (Signed)
Continue Celexa

## 2020-11-21 NOTE — Assessment & Plan Note (Signed)
Begin semaglutide and continue current dose of Jardiance  Check metabolic panel

## 2020-11-21 NOTE — Patient Instructions (Signed)
Begin semaglutide 1 daily with food Stay on Jardiance daily  No change in blood pressure medicine  Urine culture urinalysis obtained also will send to the lab for metabolic panel blood counts lipid panel  Your A1c is down to 7.4 I think we can get this lower with the semaglutide being started  Refills on all your medications are available we will refill the tramadol when it is due in 2 weeks  Referral to urology was made  I would hold off following up with dermatology for now  Try to follow a healthier diet see if you get access to foods higher in fiber as noted below  Return to see Dr. Joya Gaskins face-to-face 4 months   High-Fiber Eating Plan Fiber, also called dietary fiber, is a type of carbohydrate. It is found foods such as fruits, vegetables, whole grains, and beans. A high-fiber diet can have many health benefits. Your health care provider may recommend a high-fiber diet to help:  Prevent constipation. Fiber can make your bowel movements more regular.  Lower your cholesterol.  Relieve the following conditions: ? Inflammation of veins in the anus (hemorrhoids). ? Inflammation of specific areas of the digestive tract (uncomplicated diverticulosis). ? A problem of the large intestine, also called the colon, that sometimes causes pain and diarrhea (irritable bowel syndrome, or IBS).  Prevent overeating as part of a weight-loss plan.  Prevent heart disease, type 2 diabetes, and certain cancers. What are tips for following this plan? Reading food labels  Check the nutrition facts label on food products for the amount of dietary fiber. Choose foods that have 5 grams of fiber or more per serving.  The goals for recommended daily fiber intake include: ? Men (age 55 or younger): 34-38 g. ? Men (over age 38): 28-34 g. ? Women (age 31 or younger): 25-28 g. ? Women (over age 74): 22-25 g. Your daily fiber goal is _____________ g.   Shopping  Choose whole fruits and vegetables  instead of processed forms, such as apple juice or applesauce.  Choose a wide variety of high-fiber foods such as avocados, lentils, oats, and kidney beans.  Read the nutrition facts label of the foods you choose. Be aware of foods with added fiber. These foods often have high sugar and sodium amounts per serving. Cooking  Use whole-grain flour for baking and cooking.  Cook with brown rice instead of white rice. Meal planning  Start the day with a breakfast that is high in fiber, such as a cereal that contains 5 g of fiber or more per serving.  Eat breads and cereals that are made with whole-grain flour instead of refined flour or white flour.  Eat brown rice, bulgur wheat, or millet instead of white rice.  Use beans in place of meat in soups, salads, and pasta dishes.  Be sure that half of the grains you eat each day are whole grains. General information  You can get the recommended daily intake of dietary fiber by: ? Eating a variety of fruits, vegetables, grains, nuts, and beans. ? Taking a fiber supplement if you are not able to take in enough fiber in your diet. It is better to get fiber through food than from a supplement.  Gradually increase how much fiber you consume. If you increase your intake of dietary fiber too quickly, you may have bloating, cramping, or gas.  Drink plenty of water to help you digest fiber.  Choose high-fiber snacks, such as berries, raw vegetables, nuts, and popcorn.  What foods should I eat? Fruits Berries. Pears. Apples. Oranges. Avocado. Prunes and raisins. Dried figs. Vegetables Sweet potatoes. Spinach. Kale. Artichokes. Cabbage. Broccoli. Cauliflower. Green peas. Carrots. Squash. Grains Whole-grain breads. Multigrain cereal. Oats and oatmeal. Brown rice. Barley. Bulgur wheat. [place]. Quinoa. Bran muffins. Popcorn. Rye wafer crackers. Meats and other proteins Navy beans, kidney beans, and pinto beans. Soybeans. Split peas. Lentils. Nuts and  seeds. Dairy Fiber-fortified yogurt. Beverages Fiber-fortified soy milk. Fiber-fortified orange juice. Other foods Fiber bars. The items listed above may not be a complete list of recommended foods and beverages. Contact a dietitian for more information. What foods should I avoid? Fruits Fruit juice. Cooked, strained fruit. Vegetables Fried potatoes. Canned vegetables. Well-cooked vegetables. Grains White bread. Pasta made with refined flour. White rice. Meats and other proteins Fatty cuts of meat. Fried chicken or fried fish. Dairy Milk. Yogurt. Cream cheese. Sour cream. Fats and oils Butters. Beverages Soft drinks. Other foods Cakes and pastries. The items listed above may not be a complete list of foods and beverages to avoid. Talk with your dietitian about what choices are best for you. Summary  Fiber is a type of carbohydrate. It is found in foods such as fruits, vegetables, whole grains, and beans.  A high-fiber diet has many benefits. It can help to prevent constipation, lower blood cholesterol, aid weight loss, and reduce your risk of heart disease, diabetes, and certain cancers.  Increase your intake of fiber gradually. Increasing fiber too quickly may cause cramping, bloating, and gas. Drink plenty of water while you increase the amount of fiber you consume.  The best sources of fiber include whole fruits and vegetables, whole grains, nuts, seeds, and beans. This information is not intended to replace advice given to you by your health care provider. Make sure you discuss any questions you have with your health care provider. Document Revised: 12/10/2019 Document Reviewed: 12/10/2019 Elsevier Patient Education  2021 [employer].

## 2020-11-21 NOTE — Assessment & Plan Note (Signed)
Check urinalysis check urine culture  Referral to urology

## 2020-11-21 NOTE — Assessment & Plan Note (Signed)
Blood pressure essentially at goal no change in lisinopril dose

## 2020-11-21 NOTE — Assessment & Plan Note (Signed)
Continue current dosing of statin

## 2020-11-22 LAB — CBC WITH DIFFERENTIAL/PLATELET
Basophils Absolute: 0 10*3/uL (ref 0.0–0.2)
Basos: 0 %
EOS (ABSOLUTE): 0.1 10*3/uL (ref 0.0–0.4)
Eos: 1 %
Hematocrit: 46.1 % (ref 34.0–46.6)
Hemoglobin: 15.3 g/dL (ref 11.1–15.9)
Immature Grans (Abs): 0 10*3/uL (ref 0.0–0.1)
Immature Granulocytes: 0 %
Lymphocytes Absolute: 7.1 10*3/uL — ABNORMAL HIGH (ref 0.7–3.1)
Lymphs: 49 %
MCH: 28.8 pg (ref 26.6–33.0)
MCHC: 33.2 g/dL (ref 31.5–35.7)
MCV: 87 fL (ref 79–97)
Monocytes Absolute: 0.7 10*3/uL (ref 0.1–0.9)
Monocytes: 5 %
Neutrophils Absolute: 6.5 10*3/uL (ref 1.4–7.0)
Neutrophils: 45 %
Platelets: 280 10*3/uL (ref 150–450)
RBC: 5.31 x10E6/uL — ABNORMAL HIGH (ref 3.77–5.28)
RDW: 14.4 % (ref 11.7–15.4)
WBC: 14.5 10*3/uL — ABNORMAL HIGH (ref 3.4–10.8)

## 2020-11-22 LAB — COMPREHENSIVE METABOLIC PANEL
ALT: 19 IU/L (ref 0–32)
AST: 16 IU/L (ref 0–40)
Albumin/Globulin Ratio: 2 (ref 1.2–2.2)
Albumin: 4.5 g/dL (ref 3.7–4.7)
Alkaline Phosphatase: 117 IU/L (ref 44–121)
BUN/Creatinine Ratio: 9 — ABNORMAL LOW (ref 12–28)
BUN: 15 mg/dL (ref 8–27)
Bilirubin Total: 0.3 mg/dL (ref 0.0–1.2)
CO2: 20 mmol/L (ref 20–29)
Calcium: 9.5 mg/dL (ref 8.7–10.3)
Chloride: 102 mmol/L (ref 96–106)
Creatinine, Ser: 1.64 mg/dL — ABNORMAL HIGH (ref 0.57–1.00)
Globulin, Total: 2.3 g/dL (ref 1.5–4.5)
Glucose: 138 mg/dL — ABNORMAL HIGH (ref 65–99)
Potassium: 4.6 mmol/L (ref 3.5–5.2)
Sodium: 141 mmol/L (ref 134–144)
Total Protein: 6.8 g/dL (ref 6.0–8.5)
eGFR: 32 mL/min/{1.73_m2} — ABNORMAL LOW (ref >59)

## 2020-11-22 LAB — URINALYSIS
Bilirubin, UA: NEGATIVE
Ketones, UA: NEGATIVE
Leukocytes,UA: NEGATIVE
Nitrite, UA: NEGATIVE
Protein,UA: NEGATIVE
RBC, UA: NEGATIVE
Specific Gravity, UA: >=1.03 — AB (ref 1.005–1.030)
Urobilinogen, Ur: 1 mg/dL (ref 0.2–1.0)
pH, UA: 5.5 (ref 5.0–7.5)

## 2020-11-22 LAB — LIPID PANEL
Chol/HDL Ratio: 3.6 ratio (ref 0.0–4.4)
Cholesterol, Total: 173 mg/dL (ref 100–199)
HDL: 48 mg/dL (ref >39)
LDL Chol Calc (NIH): 94 mg/dL (ref 0–99)
Triglycerides: 180 mg/dL — ABNORMAL HIGH (ref 0–149)
VLDL Cholesterol Cal: 31 mg/dL (ref 5–40)

## 2020-11-25 ENCOUNTER — Telehealth: Payer: Self-pay

## 2020-11-25 LAB — URINE CULTURE

## 2020-11-25 NOTE — Telephone Encounter (Signed)
-----   Message from Elsie Stain, MD sent at 11/22/2020 12:14 PM EDT ----- Let pt know liver kidney normal,  blood counts ok, cholesterol at goal, stay on atorvastatin as is, urine has a lot of sugar in it and is concentrated  no urine infection   she is urinating a lot due to high blood sugar, start the semaglutide and drink plently of water

## 2020-11-25 NOTE — Telephone Encounter (Signed)
Patient was called and a voicemail was left informing patient to return phone call for lab results.  CRM created.

## 2020-11-26 ENCOUNTER — Other Ambulatory Visit: Payer: Self-pay

## 2020-11-26 MED ORDER — SEMAGLUTIDE 3 MG PO TABS
ORAL_TABLET | ORAL | 3 refills | Status: DC
  Filled 2020-11-26: qty 30, 30d supply, fill #0

## 2020-11-26 NOTE — Addendum Note (Signed)
Addended by: Elsie Stain on: 11/26/2020 06:49 AM   Modules accepted: Orders

## 2020-11-28 ENCOUNTER — Other Ambulatory Visit: Payer: Self-pay

## 2020-11-29 ENCOUNTER — Other Ambulatory Visit: Payer: Self-pay

## 2020-11-30 ENCOUNTER — Other Ambulatory Visit: Payer: Self-pay

## 2020-12-07 ENCOUNTER — Other Ambulatory Visit: Payer: Self-pay

## 2020-12-08 ENCOUNTER — Other Ambulatory Visit: Payer: Self-pay | Admitting: Critical Care Medicine

## 2020-12-08 MED ORDER — TRAMADOL HCL 50 MG PO TABS
ORAL_TABLET | ORAL | 0 refills | Status: DC
  Filled 2020-12-08: qty 90, 30d supply, fill #0

## 2020-12-08 NOTE — Telephone Encounter (Signed)
Requested medication (s) are due for refill today: yes  Requested medication (s) are on the active medication list: yes  Last refill:  11/03/20  Future visit scheduled: yes  Notes to clinic: not delegated    Requested Prescriptions  Pending Prescriptions Disp Refills   traMADol (ULTRAM) 50 MG tablet 90 tablet 0    Sig: TAKE 1 TABLET (50 MG TOTAL) BY MOUTH EVERY 8 (EIGHT) HOURS AS NEEDED FOR SEVERE PAIN.      Not Delegated - Analgesics:  Opioid Agonists Failed - 12/08/2020 12:47 PM      Failed - This refill cannot be delegated      Failed - Urine Drug Screen completed in last 360 days      Passed - Valid encounter within last 6 months    Recent Outpatient Visits           2 weeks ago Type 2 diabetes mellitus with stage 2 chronic kidney disease, without long-term current use of insulin ([place])   [place] Elsie Stain, MD   3 months ago Type 2 diabetes mellitus with stage 2 chronic kidney disease, without long-term current use of insulin ([place])   [place] Elsie Stain, MD   6 months ago Type 2 diabetes mellitus with stage 2 chronic kidney disease, without long-term current use of insulin ([place])   [place] Elsie Stain, MD   7 months ago Type 2 diabetes mellitus with stage 2 chronic kidney disease, without long-term current use of insulin ([place])   [place] Elsie Stain, MD   1 year ago Viral respiratory illness   [place], MD       Future Appointments             In 3 months Joya Gaskins Burnett Harry, MD [place]

## 2020-12-08 NOTE — Telephone Encounter (Signed)
Copied from [place] [phone]. Topic: Quick Communication - Rx Refill/Question >> Dec 08, 2020 12:42 PM Tessa Lerner A wrote: Medication: Rx #: [id number]  traMADol (ULTRAM) 50 MG tablet - patient has 1(one) tablet remaining  Has the patient contacted their pharmacy? No. Patient has not contacted their pharmacy due to the classification of the medication  Preferred Pharmacy (with phone number or street name): [facility] [place]  Phone:  [phone] Fax:  [phone]  Agent: Please be advised that RX refills may take up to 3 business days. We ask that you follow-up with your pharmacy.

## 2020-12-09 ENCOUNTER — Telehealth: Payer: Self-pay | Admitting: Critical Care Medicine

## 2020-12-09 ENCOUNTER — Other Ambulatory Visit: Payer: Self-pay

## 2020-12-09 MED FILL — Gabapentin Cap 300 MG: ORAL | 30 days supply | Qty: 90 | Fill #0 | Status: AC

## 2020-12-09 NOTE — Telephone Encounter (Signed)
Copied from [place] [phone]. Topic: General - Other >> Dec 09, 2020 11:11 AM Tessa Lerner A wrote: Reason for CRM: Patient would like to be contacted by a member of pharmacy staff when possible  Patient has questions related to their gabapentin (NEURONTIN) 300 MG capsule prescription and would like to discuss further

## 2020-12-16 ENCOUNTER — Other Ambulatory Visit (HOSPITAL_COMMUNITY): Payer: Self-pay

## 2021-01-13 ENCOUNTER — Other Ambulatory Visit: Payer: Self-pay | Admitting: Critical Care Medicine

## 2021-01-13 ENCOUNTER — Other Ambulatory Visit: Payer: Self-pay

## 2021-01-13 DIAGNOSIS — E1142 Type 2 diabetes mellitus with diabetic polyneuropathy: Secondary | ICD-10-CM

## 2021-01-13 MED ORDER — GABAPENTIN 300 MG PO CAPS
ORAL_CAPSULE | ORAL | 2 refills | Status: DC
  Filled 2021-01-13: qty 90, 22d supply, fill #0
  Filled 2021-01-30 – 2021-02-06 (×2): qty 90, 22d supply, fill #1
  Filled 2021-04-20: qty 90, 22d supply, fill #2

## 2021-01-13 NOTE — Telephone Encounter (Signed)
Requested medication (s) are due for refill today: yes  Requested medication (s) are on the active medication list: yes  Last refill:  12/08/20- 06/08/21 #90 0 refills   Future visit scheduled: yes in 2 months   Notes to clinic:  not delegated per protocol     Requested Prescriptions  Pending Prescriptions Disp Refills   traMADol (ULTRAM) 50 MG tablet 90 tablet 0    Sig: TAKE 1 TABLET (50 MG TOTAL) BY MOUTH EVERY 8 (EIGHT) HOURS AS NEEDED FOR SEVERE PAIN.      Not Delegated - Analgesics:  Opioid Agonists Failed - 01/13/2021 10:36 AM      Failed - This refill cannot be delegated      Failed - Urine Drug Screen completed in last 360 days      Passed - Valid encounter within last 6 months    Recent Outpatient Visits           1 month ago Type 2 diabetes mellitus with stage 2 chronic kidney disease, without long-term current use of insulin ([place])   [place] Elsie Stain, MD   4 months ago Type 2 diabetes mellitus with stage 2 chronic kidney disease, without long-term current use of insulin ([place])   [place] Elsie Stain, MD   7 months ago Type 2 diabetes mellitus with stage 2 chronic kidney disease, without long-term current use of insulin ([place])   [place] Elsie Stain, MD   9 months ago Type 2 diabetes mellitus with stage 2 chronic kidney disease, without long-term current use of insulin ([place])   [place] Elsie Stain, MD   1 year ago Viral respiratory illness   [place] Neighbors, MD       Future Appointments             In 2 months Elsie Stain, MD [place]              Signed Prescriptions Disp Refills   gabapentin (NEURONTIN) 300 MG capsule 90 capsule 2    Sig: TAKE 1 CAPSULE BY MOUTH 3 TIMES DAILY. MAY TAKE AN EXTRA DOSE ONCE A DAY AS  NEEDED.      Neurology: Anticonvulsants - gabapentin Passed - 01/13/2021 10:36 AM      Passed - Valid encounter within last 12 months    Recent Outpatient Visits           1 month ago Type 2 diabetes mellitus with stage 2 chronic kidney disease, without long-term current use of insulin ([place])   [place] Elsie Stain, MD   4 months ago Type 2 diabetes mellitus with stage 2 chronic kidney disease, without long-term current use of insulin ([place])   [place] Elsie Stain, MD   7 months ago Type 2 diabetes mellitus with stage 2 chronic kidney disease, without long-term current use of insulin ([place])   [place] Elsie Stain, MD   9 months ago Type 2 diabetes mellitus with stage 2 chronic kidney disease, without long-term current use of insulin ([place])   [facility] Elsie Stain, MD   1 year ago Viral respiratory illness   [place] Ladell Pier, MD  Future Appointments             In 2 months Joya Gaskins Burnett Harry, MD [place]

## 2021-01-13 NOTE — Telephone Encounter (Signed)
Future visit in 2 months  

## 2021-01-13 NOTE — Telephone Encounter (Signed)
Requested medications are due for refill today yes  Requested medications are on the active medication list yes  Last refill 4/22  Last visit 11/2020  Future visit scheduled 03/2021  Notes to clinic Not Delegated

## 2021-01-13 NOTE — Telephone Encounter (Signed)
Medication: gabapentin (NEURONTIN) 300 MG capsule [[id number]] , Rx #: [id number] traMADol (ULTRAM) 50 MG tablet [[id number]] - Pt reports she only has medication for today  Has the patient contacted their pharmacy? YES (Agent: If no, request that the patient contact the pharmacy for the refill.) (Agent: If yes, when and what did the pharmacy advise?)  Preferred Pharmacy (with phone number or street name): [facility] [place]. [place] [address] Phone: [phone] Fax: [phone] Hours: M-F 8:30a-5:30p    Agent: Please be advised that RX refills may take up to 3 business days. We ask that you follow-up with your pharmacy.

## 2021-01-16 MED ORDER — TRAMADOL HCL 50 MG PO TABS
ORAL_TABLET | ORAL | 0 refills | Status: DC
  Filled 2021-01-16 – 2021-02-06 (×3): qty 90, 30d supply, fill #0

## 2021-01-17 ENCOUNTER — Other Ambulatory Visit: Payer: Self-pay

## 2021-01-23 ENCOUNTER — Other Ambulatory Visit: Payer: Self-pay

## 2021-01-24 ENCOUNTER — Other Ambulatory Visit: Payer: Self-pay

## 2021-01-30 ENCOUNTER — Other Ambulatory Visit: Payer: Self-pay

## 2021-01-30 MED FILL — Citalopram Hydrobromide Tab 20 MG (Base Equiv): ORAL | 30 days supply | Qty: 30 | Fill #0 | Status: CN

## 2021-01-30 MED FILL — Lisinopril Tab 5 MG: ORAL | 90 days supply | Qty: 90 | Fill #0 | Status: CN

## 2021-02-03 ENCOUNTER — Other Ambulatory Visit: Payer: Self-pay

## 2021-02-03 MED FILL — Empagliflozin Tab 25 MG: ORAL | 30 days supply | Qty: 30 | Fill #0 | Status: AC

## 2021-02-06 ENCOUNTER — Other Ambulatory Visit: Payer: Self-pay

## 2021-02-06 MED FILL — Atorvastatin Calcium Tab 20 MG (Base Equivalent): ORAL | 90 days supply | Qty: 90 | Fill #0 | Status: AC

## 2021-02-06 MED FILL — Lisinopril Tab 5 MG: ORAL | 90 days supply | Qty: 90 | Fill #0 | Status: AC

## 2021-02-06 MED FILL — Metoprolol Succinate Tab ER 24HR 25 MG (Tartrate Equiv): ORAL | 90 days supply | Qty: 90 | Fill #0 | Status: AC

## 2021-02-06 MED FILL — Citalopram Hydrobromide Tab 20 MG (Base Equiv): ORAL | 30 days supply | Qty: 30 | Fill #0 | Status: AC

## 2021-03-16 ENCOUNTER — Other Ambulatory Visit: Payer: Self-pay

## 2021-03-16 ENCOUNTER — Other Ambulatory Visit: Payer: Self-pay | Admitting: Critical Care Medicine

## 2021-03-16 MED ORDER — CITALOPRAM HYDROBROMIDE 20 MG PO TABS
ORAL_TABLET | Freq: Every day | ORAL | 3 refills | Status: DC
  Filled 2021-03-16: qty 30, 30d supply, fill #0
  Filled 2021-04-20: qty 30, 30d supply, fill #1

## 2021-03-16 MED FILL — Empagliflozin Tab 25 MG: ORAL | 30 days supply | Qty: 30 | Fill #1 | Status: AC

## 2021-03-16 MED FILL — Gabapentin Cap 300 MG: ORAL | 22 days supply | Qty: 90 | Fill #0 | Status: AC

## 2021-03-16 NOTE — Telephone Encounter (Signed)
Requested medication (s) are due for refill today:  yes   Requested medication (s) are on the active medication list: yes   Last refill:  02/06/2021  Future visit scheduled: yes   Notes to clinic:  this refill cannot be delegated    Requested Prescriptions  Pending Prescriptions Disp Refills   traMADol (ULTRAM) 50 MG tablet 90 tablet 0    Sig: TAKE 1 TABLET (50 MG TOTAL) BY MOUTH EVERY 8 (EIGHT) HOURS AS NEEDED FOR SEVERE PAIN.      Not Delegated - Analgesics:  Opioid Agonists Failed - 03/16/2021  9:34 AM      Failed - This refill cannot be delegated      Failed - Urine Drug Screen completed in last 360 days      Passed - Valid encounter within last 6 months    Recent Outpatient Visits           3 months ago Type 2 diabetes mellitus with stage 2 chronic kidney disease, without long-term current use of insulin ([place])   [place] Elsie Stain, MD   6 months ago Type 2 diabetes mellitus with stage 2 chronic kidney disease, without long-term current use of insulin ([place])   [place] Elsie Stain, MD   9 months ago Type 2 diabetes mellitus with stage 2 chronic kidney disease, without long-term current use of insulin ([place])   [place] Elsie Stain, MD   11 months ago Type 2 diabetes mellitus with stage 2 chronic kidney disease, without long-term current use of insulin ([place])   [place] Elsie Stain, MD   1 year ago Viral respiratory illness   [place] Ladell Pier, MD       Future Appointments             In 1 week Elsie Stain, MD [place]              Signed Prescriptions Disp Refills   citalopram (CELEXA) 20 MG tablet 30 tablet 3    Sig: TAKE 1 TABLET (20 MG TOTAL) BY MOUTH DAILY.      Psychiatry:  Antidepressants - SSRI Passed - 03/16/2021  9:34 AM       Passed - Completed PHQ-2 or PHQ-9 in the last 360 days      Passed - Valid encounter within last 6 months    Recent Outpatient Visits           3 months ago Type 2 diabetes mellitus with stage 2 chronic kidney disease, without long-term current use of insulin ([place])   [place] Elsie Stain, MD   6 months ago Type 2 diabetes mellitus with stage 2 chronic kidney disease, without long-term current use of insulin ([place])   [place] Elsie Stain, MD   9 months ago Type 2 diabetes mellitus with stage 2 chronic kidney disease, without long-term current use of insulin ([place])   [place] Elsie Stain, MD   11 months ago Type 2 diabetes mellitus with stage 2 chronic kidney disease, without long-term current use of insulin ([place])   [place] Elsie Stain, MD   1 year ago Viral respiratory illness   [place] Karle Plumber  B, MD       Future Appointments             In 1 week Elsie Stain, MD [place]

## 2021-03-17 ENCOUNTER — Other Ambulatory Visit: Payer: Self-pay

## 2021-03-17 MED ORDER — TRAMADOL HCL 50 MG PO TABS
ORAL_TABLET | ORAL | 0 refills | Status: DC
  Filled 2021-03-17: qty 90, 30d supply, fill #0

## 2021-03-27 ENCOUNTER — Other Ambulatory Visit: Payer: Self-pay

## 2021-03-27 ENCOUNTER — Ambulatory Visit: Payer: Medicare Other | Attending: Critical Care Medicine | Admitting: Critical Care Medicine

## 2021-03-27 ENCOUNTER — Encounter: Payer: Self-pay | Admitting: Critical Care Medicine

## 2021-03-27 DIAGNOSIS — Z5329 Procedure and treatment not carried out because of patient's decision for other reasons: Secondary | ICD-10-CM

## 2021-03-27 DIAGNOSIS — Z91199 Patient's noncompliance with other medical treatment and regimen due to unspecified reason: Secondary | ICD-10-CM

## 2021-03-27 NOTE — Progress Notes (Signed)
Pt was a no show appt

## 2021-03-31 ENCOUNTER — Telehealth: Payer: Self-pay | Admitting: Critical Care Medicine

## 2021-03-31 NOTE — Telephone Encounter (Signed)
-----   Message from Donna Stain, MD sent at 03/27/2021  9:58 AM EDT ----- This pt was a No Show today for virtual appt  Pls contact her to reschedule face to face with me over next one month

## 2021-03-31 NOTE — Telephone Encounter (Signed)
Called patient and left vm to call [phone] to get scheduled for an appointment with Dr. Joya Gaskins

## 2021-04-20 ENCOUNTER — Other Ambulatory Visit: Payer: Self-pay | Admitting: Critical Care Medicine

## 2021-04-20 ENCOUNTER — Other Ambulatory Visit: Payer: Self-pay

## 2021-04-20 MED ORDER — METOPROLOL SUCCINATE ER 25 MG PO TB24
ORAL_TABLET | Freq: Every day | ORAL | 0 refills | Status: DC
  Filled 2021-04-20: qty 90, 90d supply, fill #0

## 2021-04-20 MED FILL — Empagliflozin Tab 25 MG: ORAL | 30 days supply | Qty: 30 | Fill #2 | Status: AC

## 2021-04-20 NOTE — Telephone Encounter (Signed)
Requested medication (s) are due for refill today: Yes  Requested medication (s) are on the active medication list: Yes  Last refill:  03/17/21  Future visit scheduled: No  Notes to clinic:  See request.    Requested Prescriptions  Pending Prescriptions Disp Refills   traMADol (ULTRAM) 50 MG tablet 90 tablet 0    Sig: TAKE 1 TABLET (50 MG TOTAL) BY MOUTH EVERY 8 (EIGHT) HOURS AS NEEDED FOR SEVERE PAIN.     Not Delegated - Analgesics:  Opioid Agonists Failed - 04/20/2021  2:09 PM      Failed - This refill cannot be delegated      Failed - Urine Drug Screen completed in last 360 days      Passed - Valid encounter within last 6 months    Recent Outpatient Visits           3 weeks ago No-show for appointment   [place] Elsie Stain, MD   5 months ago Type 2 diabetes mellitus with stage 2 chronic kidney disease, without long-term current use of insulin ([place])   [place] Elsie Stain, MD   8 months ago Type 2 diabetes mellitus with stage 2 chronic kidney disease, without long-term current use of insulin ([place])   [place] Elsie Stain, MD   10 months ago Type 2 diabetes mellitus with stage 2 chronic kidney disease, without long-term current use of insulin ([place])   [facility] Elsie Stain, MD   1 year ago Type 2 diabetes mellitus with stage 2 chronic kidney disease, without long-term current use of insulin ([place])   [place] Elsie Stain, MD              Signed Prescriptions Disp Refills   metoprolol succinate (TOPROL-XL) 25 MG 24 hr tablet 90 tablet 0    Sig: TAKE 1 TABLET (25 MG TOTAL) BY MOUTH DAILY.     Cardiovascular:  Beta Blockers Failed - 04/20/2021  2:09 PM      Failed - Last BP in normal range    BP Readings from Last 1 Encounters:  11/21/20 (!) 145/74          Passed - Last Heart  Rate in normal range    Pulse Readings from Last 1 Encounters:  11/21/20 89          Passed - Valid encounter within last 6 months    Recent Outpatient Visits           3 weeks ago No-show for appointment   [place] Elsie Stain, MD   5 months ago Type 2 diabetes mellitus with stage 2 chronic kidney disease, without long-term current use of insulin ([place])   [place] Elsie Stain, MD   8 months ago Type 2 diabetes mellitus with stage 2 chronic kidney disease, without long-term current use of insulin ([place])   [place] Elsie Stain, MD   10 months ago Type 2 diabetes mellitus with stage 2 chronic kidney disease, without long-term current use of insulin ([place])    [facility] Elsie Stain, MD   1 year ago Type 2 diabetes mellitus with stage 2 chronic kidney disease, without long-term current use of insulin ([place])   [place]  Elsie Stain, MD

## 2021-04-21 ENCOUNTER — Other Ambulatory Visit: Payer: Self-pay

## 2021-04-27 ENCOUNTER — Other Ambulatory Visit: Payer: Self-pay

## 2021-04-27 ENCOUNTER — Telehealth: Payer: Self-pay | Admitting: Critical Care Medicine

## 2021-04-27 NOTE — Telephone Encounter (Signed)
Called pt unable to reach pt or leave VM. No option at this time.

## 2021-04-27 NOTE — Telephone Encounter (Signed)
Copied from [place] [phone]. Topic: General - Call Back - No Documentation >> Apr 27, 2021 12:05 PM Erick Blinks wrote: Reason for CRM: Pt called and is requesting to speak to a nurse, has questions regarding medication. traMADol (ULTRAM) 50 MG tablet  Best contact: [phone]  Patient has an appt with Dr. Joya Gaskins 9-13

## 2021-05-02 ENCOUNTER — Encounter: Payer: Self-pay | Admitting: Critical Care Medicine

## 2021-05-02 ENCOUNTER — Ambulatory Visit: Payer: Medicare Other | Attending: Critical Care Medicine | Admitting: Critical Care Medicine

## 2021-05-02 ENCOUNTER — Other Ambulatory Visit: Payer: Self-pay

## 2021-05-02 VITALS — BP 119/74 | HR 83 | Resp 16 | Wt 167.6 lb

## 2021-05-02 DIAGNOSIS — E785 Hyperlipidemia, unspecified: Secondary | ICD-10-CM

## 2021-05-02 DIAGNOSIS — H903 Sensorineural hearing loss, bilateral: Secondary | ICD-10-CM | POA: Diagnosis not present

## 2021-05-02 DIAGNOSIS — E1122 Type 2 diabetes mellitus with diabetic chronic kidney disease: Secondary | ICD-10-CM | POA: Diagnosis not present

## 2021-05-02 DIAGNOSIS — E1169 Type 2 diabetes mellitus with other specified complication: Secondary | ICD-10-CM

## 2021-05-02 DIAGNOSIS — Z5900 Homelessness unspecified: Secondary | ICD-10-CM | POA: Diagnosis not present

## 2021-05-02 DIAGNOSIS — E113293 Type 2 diabetes mellitus with mild nonproliferative diabetic retinopathy without macular edema, bilateral: Secondary | ICD-10-CM

## 2021-05-02 DIAGNOSIS — R300 Dysuria: Secondary | ICD-10-CM

## 2021-05-02 DIAGNOSIS — E1142 Type 2 diabetes mellitus with diabetic polyneuropathy: Secondary | ICD-10-CM

## 2021-05-02 DIAGNOSIS — F331 Major depressive disorder, recurrent, moderate: Secondary | ICD-10-CM

## 2021-05-02 DIAGNOSIS — N182 Chronic kidney disease, stage 2 (mild): Secondary | ICD-10-CM | POA: Diagnosis not present

## 2021-05-02 DIAGNOSIS — E1149 Type 2 diabetes mellitus with other diabetic neurological complication: Secondary | ICD-10-CM | POA: Diagnosis not present

## 2021-05-02 DIAGNOSIS — I10 Essential (primary) hypertension: Secondary | ICD-10-CM | POA: Diagnosis not present

## 2021-05-02 MED ORDER — TRAMADOL HCL 50 MG PO TABS
ORAL_TABLET | ORAL | 0 refills | Status: DC
  Filled 2021-05-02: qty 90, 30d supply, fill #0

## 2021-05-02 MED ORDER — CITALOPRAM HYDROBROMIDE 20 MG PO TABS
ORAL_TABLET | Freq: Every day | ORAL | 3 refills | Status: DC
  Filled 2021-05-02: qty 30, fill #0
  Filled 2021-05-22: qty 30, 30d supply, fill #0
  Filled 2021-06-23 – 2021-07-03 (×2): qty 30, 30d supply, fill #1
  Filled 2021-07-31 – 2021-08-07 (×2): qty 30, 30d supply, fill #2

## 2021-05-02 MED ORDER — EMPAGLIFLOZIN 25 MG PO TABS
ORAL_TABLET | Freq: Every day | ORAL | 3 refills | Status: DC
Start: 2021-05-02 — End: 2021-08-07
  Filled 2021-05-02: qty 60, fill #0
  Filled 2021-05-22: qty 30, 30d supply, fill #0
  Filled 2021-06-23 – 2021-07-03 (×2): qty 30, 30d supply, fill #1
  Filled 2021-07-31 – 2021-08-07 (×2): qty 30, 30d supply, fill #2

## 2021-05-02 MED ORDER — METOPROLOL SUCCINATE ER 25 MG PO TB24
ORAL_TABLET | Freq: Every day | ORAL | 0 refills | Status: DC
  Filled 2021-05-02: qty 90, fill #0
  Filled 2021-07-31: qty 30, 30d supply, fill #0
  Filled 2021-08-07: qty 90, 90d supply, fill #0

## 2021-05-02 MED ORDER — LISINOPRIL 5 MG PO TABS
ORAL_TABLET | Freq: Every day | ORAL | 2 refills | Status: DC
  Filled 2021-05-02: qty 30, 30d supply, fill #0
  Filled 2021-06-23: qty 90, 90d supply, fill #1
  Filled 2021-07-03: qty 30, 30d supply, fill #1
  Filled 2021-07-31 – 2021-08-07 (×2): qty 30, 30d supply, fill #2

## 2021-05-02 MED ORDER — GABAPENTIN 300 MG PO CAPS
ORAL_CAPSULE | ORAL | 2 refills | Status: DC
Start: 2021-05-02 — End: 2021-08-07
  Filled 2021-05-02: qty 90, fill #0
  Filled 2021-05-22: qty 90, 30d supply, fill #0
  Filled 2021-06-23 – 2021-07-03 (×2): qty 90, 30d supply, fill #1
  Filled 2021-08-04: qty 90, 30d supply, fill #2

## 2021-05-02 MED ORDER — VITAMIN D 125 MCG (5000 UT) PO CAPS
1.0000 | ORAL_CAPSULE | Freq: Every day | ORAL | 1 refills | Status: DC
  Filled 2021-05-02: qty 60, fill #0

## 2021-05-02 MED ORDER — ATORVASTATIN CALCIUM 20 MG PO TABS
ORAL_TABLET | Freq: Every day | ORAL | 3 refills | Status: DC
  Filled 2021-05-02: qty 30, 30d supply, fill #0

## 2021-05-02 NOTE — Assessment & Plan Note (Signed)
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

## 2021-05-02 NOTE — Assessment & Plan Note (Signed)
Follow-up metabolic panel 

## 2021-05-02 NOTE — Assessment & Plan Note (Signed)
We will need to repeat A1c patient will come back for labs continue Jardiance for now and hold on semaglutide

## 2021-05-02 NOTE — Assessment & Plan Note (Signed)
Continue statin therapy.

## 2021-05-02 NOTE — Assessment & Plan Note (Addendum)
Currently has apartment housing

## 2021-05-02 NOTE — Patient Instructions (Signed)
A referral to otolaryngology will be made for your hearing  Referral to ophthalmology will be made  Refills on all your medications sent to our pharmacy  Please come back to have your labs drawn tomorrow when you come to get your medications tomorrow  Obtain a urine sample for protein in the urine and a urinalysis you can do that tomorrow when you come back for the labs  I will have the licensed clinical social worker [place] call you about your phobias around your use of the kitchen  Return to see Dr. Joya Gaskins 4 months

## 2021-05-02 NOTE — Assessment & Plan Note (Signed)
Will need repeat eye exam refer back to ophthalmology

## 2021-05-02 NOTE — Assessment & Plan Note (Signed)
Hypertension in good control no change in medications
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
## 2021-05-03 ENCOUNTER — Telehealth: Payer: Self-pay | Admitting: Critical Care Medicine

## 2021-05-03 ENCOUNTER — Other Ambulatory Visit: Payer: Self-pay

## 2021-05-03 DIAGNOSIS — I10 Essential (primary) hypertension: Secondary | ICD-10-CM | POA: Diagnosis not present

## 2021-05-03 DIAGNOSIS — E1169 Type 2 diabetes mellitus with other specified complication: Secondary | ICD-10-CM | POA: Diagnosis not present

## 2021-05-03 DIAGNOSIS — E785 Hyperlipidemia, unspecified: Secondary | ICD-10-CM | POA: Diagnosis not present

## 2021-05-03 DIAGNOSIS — R300 Dysuria: Secondary | ICD-10-CM | POA: Diagnosis not present

## 2021-05-03 DIAGNOSIS — E1149 Type 2 diabetes mellitus with other diabetic neurological complication: Secondary | ICD-10-CM | POA: Diagnosis not present

## 2021-05-03 NOTE — Telephone Encounter (Signed)
I spoke with pt and scheduled appt for 05/29/21. I let pt know that was the soonest I had and I will call her if anything opens up

## 2021-05-03 NOTE — Telephone Encounter (Signed)
Requesting an appt with you soon ,  telephone is better. She has transportation barriers and cannot do video visits  Has severe anxiety and depression, not suicidal.  Has severe hoarding behavior  Just moved into [address] after two years living in her car homeless  Only eating PB and crackers , has diabeters  Weird in that she gets meals delivered to her apt but has a phobia with using microwave to heat foods  Any thoughts pls let me know  Adding Donna Nguyen to this as well, Donna Nguyen knows this pt well

## 2021-05-04 LAB — COMPREHENSIVE METABOLIC PANEL
ALT: 13 IU/L (ref 0–32)
AST: 15 IU/L (ref 0–40)
Albumin/Globulin Ratio: 2 (ref 1.2–2.2)
Albumin: 4.3 g/dL (ref 3.7–4.7)
Alkaline Phosphatase: 128 IU/L — ABNORMAL HIGH (ref 44–121)
BUN/Creatinine Ratio: 15 (ref 12–28)
BUN: 21 mg/dL (ref 8–27)
Bilirubin Total: 0.4 mg/dL (ref 0.0–1.2)
CO2: 22 mmol/L (ref 20–29)
Calcium: 9.3 mg/dL (ref 8.7–10.3)
Chloride: 101 mmol/L (ref 96–106)
Creatinine, Ser: 1.39 mg/dL — ABNORMAL HIGH (ref 0.57–1.00)
Globulin, Total: 2.2 g/dL (ref 1.5–4.5)
Glucose: 140 mg/dL — ABNORMAL HIGH (ref 65–99)
Potassium: 4.7 mmol/L (ref 3.5–5.2)
Sodium: 141 mmol/L (ref 134–144)
Total Protein: 6.5 g/dL (ref 6.0–8.5)
eGFR: 39 mL/min/{1.73_m2} — ABNORMAL LOW (ref >59)

## 2021-05-04 LAB — URINALYSIS
Bilirubin, UA: NEGATIVE
Ketones, UA: NEGATIVE
Nitrite, UA: NEGATIVE
RBC, UA: NEGATIVE
Specific Gravity, UA: 1.024 (ref 1.005–1.030)
Urobilinogen, Ur: 1 mg/dL (ref 0.2–1.0)
pH, UA: 7 (ref 5.0–7.5)

## 2021-05-04 LAB — CBC WITH DIFFERENTIAL/PLATELET
Basophils Absolute: 0.1 10*3/uL (ref 0.0–0.2)
Basos: 0 %
EOS (ABSOLUTE): 0.2 10*3/uL (ref 0.0–0.4)
Eos: 1 %
Hematocrit: 43.7 % (ref 34.0–46.6)
Hemoglobin: 14.9 g/dL (ref 11.1–15.9)
Immature Grans (Abs): 0 10*3/uL (ref 0.0–0.1)
Immature Granulocytes: 0 %
Lymphocytes Absolute: 11.3 10*3/uL — ABNORMAL HIGH (ref 0.7–3.1)
Lymphs: 64 %
MCH: 31 pg (ref 26.6–33.0)
MCHC: 34.1 g/dL (ref 31.5–35.7)
MCV: 91 fL (ref 79–97)
Monocytes Absolute: 0.9 10*3/uL (ref 0.1–0.9)
Monocytes: 5 %
Neutrophils Absolute: 5.4 10*3/uL (ref 1.4–7.0)
Neutrophils: 30 %
Platelets: 269 10*3/uL (ref 150–450)
RBC: 4.8 x10E6/uL (ref 3.77–5.28)
RDW: 12.9 % (ref 11.7–15.4)
WBC: 17.9 10*3/uL — ABNORMAL HIGH (ref 3.4–10.8)

## 2021-05-04 LAB — LIPID PANEL
Chol/HDL Ratio: 4.5 ratio — ABNORMAL HIGH (ref 0.0–4.4)
Cholesterol, Total: 211 mg/dL — ABNORMAL HIGH (ref 100–199)
HDL: 47 mg/dL (ref >39)
LDL Chol Calc (NIH): 114 mg/dL — ABNORMAL HIGH (ref 0–99)
Triglycerides: 286 mg/dL — ABNORMAL HIGH (ref 0–149)
VLDL Cholesterol Cal: 50 mg/dL — ABNORMAL HIGH (ref 5–40)

## 2021-05-04 LAB — MICROALBUMIN / CREATININE URINE RATIO
Creatinine, Urine: 72.4 mg/dL
Microalb/Creat Ratio: 12 mg/g creat (ref 0–29)
Microalbumin, Urine: 8.8 ug/mL

## 2021-05-04 LAB — HEMOGLOBIN A1C
Est. average glucose Bld gHb Est-mCnc: 140 mg/dL
Hgb A1c MFr Bld: 6.5 % — ABNORMAL HIGH (ref 4.8–5.6)

## 2021-05-04 NOTE — Telephone Encounter (Signed)
Noted  

## 2021-05-05 ENCOUNTER — Telehealth: Payer: Self-pay

## 2021-05-05 ENCOUNTER — Other Ambulatory Visit: Payer: Self-pay

## 2021-05-05 ENCOUNTER — Other Ambulatory Visit: Payer: Self-pay | Admitting: Critical Care Medicine

## 2021-05-05 MED ORDER — ATORVASTATIN CALCIUM 40 MG PO TABS
40.0000 mg | ORAL_TABLET | Freq: Every day | ORAL | 3 refills | Status: DC
  Filled 2021-05-05: qty 90, 90d supply, fill #0
  Filled 2021-05-22: qty 30, 30d supply, fill #0
  Filled 2021-06-23: qty 90, 90d supply, fill #1
  Filled 2021-07-31 – 2021-08-07 (×2): qty 30, 30d supply, fill #1

## 2021-05-05 NOTE — Telephone Encounter (Signed)
Contacted pt to go over lab results pt didn't answer Pt voicemail is not set up yet.   Sent a CRM and forward labs to NT to give pt labs when they call back

## 2021-05-22 ENCOUNTER — Other Ambulatory Visit: Payer: Self-pay

## 2021-05-22 ENCOUNTER — Other Ambulatory Visit: Payer: Self-pay | Admitting: Critical Care Medicine

## 2021-05-23 NOTE — Telephone Encounter (Signed)
Requested medication (s) are due for refill today:   Requested medication (s) are on the active medication list: Yes  Last refill:  05/02/21  Future visit scheduled: Yes  Notes to clinic:  See request.    Requested Prescriptions  Pending Prescriptions Disp Refills   traMADol (ULTRAM) 50 MG tablet 90 tablet 0    Sig: TAKE 1 TABLET (50 MG TOTAL) BY MOUTH EVERY 8 (EIGHT) HOURS AS NEEDED FOR SEVERE PAIN.     Not Delegated - Analgesics:  Opioid Agonists Failed - 05/22/2021  3:09 PM      Failed - This refill cannot be delegated      Failed - Urine Drug Screen completed in last 360 days      Passed - Valid encounter within last 6 months    Recent Outpatient Visits           3 weeks ago Type 2 diabetes mellitus with neurological manifestations [facility])   [place] Elsie Stain, MD   1 month ago No-show for appointment   [place] Elsie Stain, MD   6 months ago Type 2 diabetes mellitus with stage 2 chronic kidney disease, without long-term current use of insulin [facility])   [place] Elsie Stain, MD   9 months ago Type 2 diabetes mellitus with stage 2 chronic kidney disease, without long-term current use of insulin ([place])   [place] Elsie Stain, MD   11 months ago Type 2 diabetes mellitus with stage 2 chronic kidney disease, without long-term current use of insulin ([place])   [place] Elsie Stain, MD       Future Appointments             In 6 days McCoy, Alonna Buckler, [place]   In 2 months Joya Gaskins, Burnett Harry, MD [place]

## 2021-05-23 NOTE — Telephone Encounter (Signed)
Requested medications are due for refill today requesting 10 early  Requested medications are on the active medication list yes  Last refill 05/03/21  Last visit 05/02/21  Future visit scheduled 08/07/21  Notes to clinic Not Delegated, please assess.

## 2021-05-24 ENCOUNTER — Other Ambulatory Visit: Payer: Self-pay

## 2021-05-24 MED ORDER — TRAMADOL HCL 50 MG PO TABS
ORAL_TABLET | ORAL | 0 refills | Status: DC
  Filled 2021-05-24: qty 90, fill #0
  Filled 2021-05-30: qty 90, 30d supply, fill #0

## 2021-05-25 ENCOUNTER — Other Ambulatory Visit: Payer: Self-pay

## 2021-05-29 ENCOUNTER — Ambulatory Visit: Payer: Medicare Other | Attending: Critical Care Medicine | Admitting: Clinical

## 2021-05-29 ENCOUNTER — Other Ambulatory Visit: Payer: Self-pay

## 2021-05-29 DIAGNOSIS — F333 Major depressive disorder, recurrent, severe with psychotic symptoms: Secondary | ICD-10-CM | POA: Diagnosis not present

## 2021-05-30 ENCOUNTER — Other Ambulatory Visit: Payer: Self-pay

## 2021-05-31 ENCOUNTER — Other Ambulatory Visit: Payer: Self-pay
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
## 2021-06-19 ENCOUNTER — Telehealth: Payer: Self-pay

## 2021-06-19 ENCOUNTER — Ambulatory Visit (HOSPITAL_BASED_OUTPATIENT_CLINIC_OR_DEPARTMENT_OTHER): Payer: Medicare Other | Admitting: *Deleted

## 2021-06-19 ENCOUNTER — Ambulatory Visit: Payer: Medicare Other | Attending: Family Medicine | Admitting: Clinical

## 2021-06-19 ENCOUNTER — Other Ambulatory Visit: Payer: Self-pay

## 2021-06-19 DIAGNOSIS — R3589 Other polyuria: Secondary | ICD-10-CM | POA: Diagnosis not present

## 2021-06-19 DIAGNOSIS — F331 Major depressive disorder, recurrent, moderate: Secondary | ICD-10-CM

## 2021-06-19 LAB — POCT URINALYSIS DIP (CLINITEK)
Bilirubin, UA: NEGATIVE
Blood, UA: NEGATIVE
Glucose, UA: >=1000 mg/dL — AB
Nitrite, UA: NEGATIVE
POC PROTEIN,UA: NEGATIVE
Spec Grav, UA: 1.02 (ref 1.010–1.025)
Urobilinogen, UA: 0.2 E.U./dL
pH, UA: 6 (ref 5.0–8.0)

## 2021-06-19 NOTE — Telephone Encounter (Signed)
Patient was meeting with Collene Mares, LCSW this afternoon. She reported falling on 06/16/2021 and hitting the side of her head on a doorway.  She also bruised her RUE.  She said the pain from that fall is subsiding but she fell again today and sustained another bruise on her RUE.  She said her body, not her head,  is hurting all over. She refuses to go to ED or Urgent Care. Provider not available at [facility] to see her today.   She also said her urine has been dark brown for a few months.  She said that she had some black stools in the past but that has resolved.    This CM spoke to [facility]. PA who is ordering a urinalysis today and will see the patient tomorrow - 06/20/2021 @ 0830. This information was shared with the patient. She still refuses to go to ED but said she understands she will need to go if her pain gets works, she develops a headache, bleeding, difficulty breathing, chest pain.

## 2021-06-20 ENCOUNTER — Encounter: Payer: Self-pay | Admitting: Physician Assistant

## 2021-06-20 ENCOUNTER — Ambulatory Visit: Payer: Medicare Other | Attending: Physician Assistant | Admitting: Physician Assistant

## 2021-06-20 VITALS — BP 132/71 | HR 84 | Temp 98.9°F | Resp 18 | Ht 65.0 in

## 2021-06-20 DIAGNOSIS — M25512 Pain in left shoulder: Secondary | ICD-10-CM

## 2021-06-20 DIAGNOSIS — R82998 Other abnormal findings in urine: Secondary | ICD-10-CM | POA: Diagnosis not present

## 2021-06-20 DIAGNOSIS — R42 Dizziness and giddiness: Secondary | ICD-10-CM

## 2021-06-20 DIAGNOSIS — Z9989 Dependence on other enabling machines and devices: Secondary | ICD-10-CM | POA: Diagnosis not present

## 2021-06-20 DIAGNOSIS — M79601 Pain in right arm: Secondary | ICD-10-CM

## 2021-06-20 DIAGNOSIS — W19XXXA Unspecified fall, initial encounter: Secondary | ICD-10-CM | POA: Diagnosis not present

## 2021-06-20 NOTE — Progress Notes (Signed)
Established Patient Office Visit  Subjective:  Patient ID: Donna Nguyen, female    DOB: 1940-11-12  Age: 80 y.o. MRN: [id number]  CC:  Chief Complaint  Patient presents with   Fall    HPI Donna Nguyen reports that she has had 2 recent falls, one was last week and then again yesterday.  Does endorse both times the "walker got away from her".  Reports last week she fell against the doorknob on her right upper arm which left a significant bruise.  Reports that yesterday she fell on her left shoulder.  States that she does have pain in her left shoulder, denies radiation, denies numbness or tingling.  Reports no change in her range of motion.  States that she has been taking gabapentin and tramadol with a small amount of relief.  Reports that she has had 2 episodes of feeling lightheaded upon standing up from her bed over the past couple of weeks as well.  Reports when this happens she feels "everything in her body disconnects" and she has to sit down on the ground to recover.  States that she does not check her blood pressure or blood sugar levels during these episodes.  Denies syncope during these episodes.  States that she has had dark urine on and off over the past couple of months.  Describes it as brown.  Reports that she has started increasing her water intake and has noticed an improvement in the color.  Denies any UTI symptoms.      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 No pertinent family history.  Social History   Socioeconomic History   Marital status: Divorced    Spouse name: Not on file   Number of children: Not on file   Years of education: Not on file   Highest education  level: Not on file  Occupational History   Not on file  Tobacco Use   Smoking status: Never   Smokeless tobacco: Never  Substance and Sexual Activity   Alcohol use: Yes    Alcohol/week: 4.0 - 5.0 standard drinks    Types: 1 - 2 Glasses of wine, 2 Cans of beer, 1 Shots of liquor per week    Comment: nightly   Drug use: No   Sexual activity: Yes    Birth control/protection: Post-menopausal  Other Topics Concern   Not on file  Social History Narrative   Not on file   Social Determinants of Health   Financial Resource Strain: Not on file  Food Insecurity: Not on file  Transportation Needs: Not on file  Physical Activity: Not on file  Stress: Not on file  Social Connections: Not on file  Intimate Partner Violence: Not on file    Outpatient Medications Prior to Visit  Medication Sig Dispense Refill   atorvastatin (LIPITOR) 40 MG tablet Take 1 tablet (40 mg total) by mouth daily. 90 tablet 3   Cholecalciferol (VITAMIN D) 125 MCG (5000 UT) CAPS Take 1 capsule by mouth daily. 60 capsule 1   citalopram (CELEXA) 20 MG tablet TAKE 1 TABLET (20 MG TOTAL) BY MOUTH DAILY. 30 tablet 3  empagliflozin (JARDIANCE) 25 MG TABS tablet TAKE 1 TABLET (25 MG TOTAL) BY MOUTH DAILY. 60 tablet 3   gabapentin (NEURONTIN) 300 MG capsule TAKE 1 CAPSULE BY MOUTH 3 TIMES DAILY. MAY TAKE AN EXTRA DOSE ONCE A DAY AS NEEDED. 90 capsule 2   lisinopril (ZESTRIL) 5 MG tablet TAKE 1 TABLET (5 MG TOTAL) BY MOUTH DAILY. 90 tablet 2   metoprolol succinate (TOPROL-XL) 25 MG 24 hr tablet TAKE 1 TABLET (25 MG TOTAL) BY MOUTH DAILY. 90 tablet 0   traMADol (ULTRAM) 50 MG tablet TAKE 1 TABLET (50 MG TOTAL) BY MOUTH EVERY 8 (EIGHT) HOURS AS NEEDED FOR SEVERE PAIN. 90 tablet 0   No facility-administered medications prior to visit.    Allergies  Allergen Reactions   Acetaminophen Shortness Of Breath   Influenza Virus Vaccine Split [Influenza Vac Typ A&B Surf Ant] Swelling   Amitriptyline    Ciprofloxacin     Epinephrine    Penicillins    Metformin And Related Palpitations    ROS Review of Systems  Constitutional: Negative.   HENT: Negative.    Eyes: Negative.   Respiratory:  Negative for shortness of breath.   Cardiovascular:  Negative for chest pain.  Gastrointestinal: Negative.   Endocrine: Negative.   Genitourinary:  Negative for dysuria, frequency, hematuria, urgency and vaginal discharge.  Musculoskeletal:  Positive for arthralgias and myalgias. Negative for joint swelling.  Skin:  Positive for color change.  Allergic/Immunologic: Negative.   Neurological:  Positive for light-headedness. Negative for dizziness, syncope and headaches.  Hematological: Negative.   Psychiatric/Behavioral: Negative.       Objective:    Physical Exam Vitals and nursing note reviewed.  Constitutional:      General: She is not in acute distress.    Appearance: Normal appearance. She is obese. She is not ill-appearing.  HENT:     Head: Normocephalic and atraumatic.     Right Ear: External ear normal.     Left Ear: External ear normal.     Nose: Nose normal.     Mouth/Throat:     Mouth: Mucous membranes are moist.     Pharynx: Oropharynx is clear.  Eyes:     Extraocular Movements: Extraocular movements intact.     Conjunctiva/sclera: Conjunctivae normal.     Pupils: Pupils are equal, round, and reactive to light.  Cardiovascular:     Rate and Rhythm: Normal rate and regular rhythm.     Pulses: Normal pulses.     Heart sounds: Normal heart sounds.  Pulmonary:     Effort: Pulmonary effort is normal.     Breath sounds: Normal breath sounds.  Musculoskeletal:     Right shoulder: Normal.     Left shoulder: Tenderness present. No swelling, bony tenderness or crepitus. Normal range of motion.     Right upper arm: Tenderness present. No swelling.     Left upper arm: No swelling or tenderness.     Cervical back: Normal range of motion and neck supple.  Skin:    Findings: Bruising present.      Comments: Yellow bruise right upper arm  Neurological:     General: No focal deficit present.     Mental Status: She is alert and oriented to person, place, and time.  Psychiatric:        Mood and Affect: Mood normal.        Behavior: Behavior normal.        Thought Content: Thought content normal.  Judgment: Judgment normal.    BP 132/71 (BP Location: Left Arm, Patient Position: Sitting, Cuff Size: Normal)   Pulse 84   Temp 98.9 F (37.2 C) (Oral)   Resp 18   Ht 5' 5"  (1.651 m)   SpO2 98%   BMI 27.89 kg/m  Wt Readings from Last 3 Encounters:  05/02/21 167 lb 9.6 oz (76 kg)  11/21/20 192 lb 9.6 oz (87.4 kg)  08/23/20 199 lb (90.3 kg)     Health Maintenance Due  Topic Date Due   Pneumonia Vaccine 54+ Years old (1 - PCV) Never done   Zoster Vaccines- Shingrix (1 of 2) Never done    There are no preventive care reminders to display for this patient.  Lab Results  Component Value Date   TSH 3.050 04/18/2020   Lab Results  Component Value Date   WBC 17.9 (H) 05/03/2021   HGB 14.9 05/03/2021   HCT 43.7 05/03/2021   MCV 91 05/03/2021   PLT 269 05/03/2021   Lab Results  Component Value Date   NA 141 05/03/2021   K 4.7 05/03/2021   CO2 22 05/03/2021   GLUCOSE 140 (H) 05/03/2021   BUN 21 05/03/2021   CREATININE 1.39 (H) 05/03/2021   BILITOT 0.4 05/03/2021   ALKPHOS 128 (H) 05/03/2021   AST 15 05/03/2021   ALT 13 05/03/2021   PROT 6.5 05/03/2021   ALBUMIN 4.3 05/03/2021   CALCIUM 9.3 05/03/2021   EGFR 39 (L) 05/03/2021   Lab Results  Component Value Date   CHOL 211 (H) 05/03/2021   Lab Results  Component Value Date   HDL 47 05/03/2021   Lab Results  Component Value Date   LDLCALC 114 (H) 05/03/2021   Lab Results  Component Value Date   TRIG 286 (H) 05/03/2021   Lab Results  Component Value Date   CHOLHDL 4.5 (H) 05/03/2021   Lab Results  Component Value Date   HGBA1C 6.5 (H) 05/03/2021      Assessment & Plan:   Problem List Items  Addressed This Visit       Other   Fall - Primary   Right arm pain   Brown-colored urine   Light-headedness   Walker as ambulation aid   Other Visit Diagnoses     Acute pain of left shoulder           No orders of the defined types were placed in this encounter. 1. Fall, initial encounter Upon inspection of walker, changes were made to help with stability and safety.  Patient education given on proper use of walker, patient understands and agrees.  Patient also encouraged to wear lace up supportive shoes.  2. Acute pain of left shoulder Patient education given on supportive care, red flags for prompt reevaluation  3. Right arm pain Patient education given on supportive care, red flags for prompt reevaluation  4. Brown-colored urine UA negative for brown color, urinary tract infection.  Patient encouraged to continue working on increasing hydration  5. Light-headedness Patient encouraged to continue working on increasing hydration, red flags given for prompt reevaluation patient encouraged to monitor blood glucose and blood pressure levels during episodes.  6. Walker as ambulation aid    I have reviewed the patient's medical history (PMH, PSH, Social History, Family History, Medications, and allergies) , and have been updated if relevant. I spent 30 minutes reviewing chart and  face to face time with patient.      Follow-up: Return if symptoms worsen or  fail to improve.    Loraine Grip Mayers, PA-C

## 2021-06-20 NOTE — Progress Notes (Signed)
Patient has eaten and taken medication today. Patient reports generalized pain at an 8 with L shoulder and R leg hurting the most.
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
## 2021-06-20 NOTE — Patient Instructions (Signed)
I encourage you to increase your water intake.  I encourage you to use ice in your right upper arm and left shoulder.  I encourage you to make sure that you are using your walker appropriately and wearing good supportive shoes.  I encourage you to make sure to take time to sit at the side of the bed before getting up to move.  Please let us know if there is anything else we can do for you  Kennieth Rad, PA-C Physician Assistant [facility] [email or web address]   Shoulder Pain Many things can cause shoulder pain, including: An injury to the shoulder. Overuse of the shoulder. Arthritis. The source of the pain can be: Inflammation. An injury to the shoulder joint. An injury to a tendon, ligament, or bone. Follow these instructions at home: Pay attention to changes in your symptoms. Let your health care provider know about them. Follow these instructions to relieve your pain. If you have a sling: Wear the sling as told by your health care provider. Remove it only as told by your health care provider. Loosen the sling if your fingers tingle, become numb, or turn cold and blue. Keep the sling clean. If the sling is not waterproof: Do not let it get wet. Remove it to shower or bathe. Move your arm as little as possible, but keep your hand moving to prevent swelling. Managing pain, stiffness, and swelling  If directed, put ice on the painful area: Put ice in a plastic bag. Place a towel between your skin and the bag. Leave the ice on for 20 minutes, 2-3 times per day. Stop applying ice if it does not help with the pain. Squeeze a soft ball or a foam pad as much as possible. This helps to keep the shoulder from swelling. It also helps to strengthen the arm. General instructions Take over-the-counter and prescription medicines only as told by your health care provider. Keep all follow-up visits as told by your health care  provider. This is important. Contact a health care provider if: Your pain gets worse. Your pain is not relieved with medicines. New pain develops in your arm, hand, or fingers. Get help right away if: Your arm, hand, or fingers: Tingle. Become numb. Become swollen. Become painful. Turn white or blue. Summary Shoulder pain can be caused by an injury, overuse, or arthritis. Pay attention to changes in your symptoms. Let your health care provider know about them. This condition may be treated with a sling, ice, and pain medicines. Contact your health care provider if the pain gets worse or new pain develops. Get help right away if your arm, hand, or fingers tingle or become numb, swollen, or painful. Keep all follow-up visits as told by your health care provider. This is important. This information is not intended to replace advice given to you by your health care provider. Make sure you discuss any questions you have with your health care provider. Document Revised: 02/18/2018 Document Reviewed: 02/18/2018 Elsevier Patient Education  [place].

## 2021-06-23 ENCOUNTER — Other Ambulatory Visit: Payer: Self-pay

## 2021-06-30 ENCOUNTER — Other Ambulatory Visit: Payer: Self-pay

## 2021-07-03 ENCOUNTER — Other Ambulatory Visit: Payer: Self-pay | Admitting: Family Medicine

## 2021-07-03 ENCOUNTER — Other Ambulatory Visit: Payer: Self-pay

## 2021-07-03 NOTE — Telephone Encounter (Signed)
Requested medications are due for refill today yes  Requested medications are on the active medication list yes  Last refill 05/31/21  Last visit 06/20/21  Future visit scheduled 08/07/21  Notes to clinic This medication can not be delegated, please assess.  Requested Prescriptions  Pending Prescriptions Disp Refills   traMADol (ULTRAM) 50 MG tablet 90 tablet 0    Sig: TAKE 1 TABLET (50 MG TOTAL) BY MOUTH EVERY 8 (EIGHT) HOURS AS NEEDED FOR SEVERE PAIN.     Not Delegated - Analgesics:  Opioid Agonists Failed - 07/03/2021  4:23 PM      Failed - This refill cannot be delegated      Failed - Urine Drug Screen completed in last 360 days      Passed - Valid encounter within last 6 months    Recent Outpatient Visits           1 week ago Fall, initial encounter   [place], Cari S, PA-C   2 months ago Type 2 diabetes mellitus with neurological manifestations [facility])   [place] Elsie Stain, MD   3 months ago No-show for appointment   [place] Elsie Stain, MD   7 months ago Type 2 diabetes mellitus with stage 2 chronic kidney disease, without long-term current use of insulin [facility])   [place], Patrick E, MD   10 months ago Type 2 diabetes mellitus with stage 2 chronic kidney disease, without long-term current use of insulin [facility])   [place], MD       Future Appointments             In 1 month Joya Gaskins Burnett Harry, MD [place]

## 2021-07-05 ENCOUNTER — Other Ambulatory Visit: Payer: Self-pay

## 2021-07-06 ENCOUNTER — Other Ambulatory Visit: Payer: Self-pay

## 2021-07-06 MED ORDER — TRAMADOL HCL 50 MG PO TABS
ORAL_TABLET | ORAL | 0 refills | Status: DC
  Filled 2021-07-06: qty 90, 30d supply, fill #0

## 2021-07-07 ENCOUNTER — Other Ambulatory Visit: Payer: Self-pay

## 2021-07-10 ENCOUNTER — Other Ambulatory Visit: Payer: Self-pay

## 2021-07-10 ENCOUNTER — Ambulatory Visit: Payer: Medicare Other | Attending: Critical Care Medicine | Admitting: Clinical

## 2021-07-10 DIAGNOSIS — F333 Major depressive disorder, recurrent, severe with psychotic symptoms: Secondary | ICD-10-CM
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
## 2021-07-31 ENCOUNTER — Other Ambulatory Visit: Payer: Self-pay | Admitting: Critical Care Medicine

## 2021-07-31 ENCOUNTER — Ambulatory Visit: Payer: Medicare Other | Attending: Critical Care Medicine | Admitting: Clinical

## 2021-07-31 ENCOUNTER — Other Ambulatory Visit: Payer: Self-pay

## 2021-07-31 DIAGNOSIS — F333 Major depressive disorder, recurrent, severe with psychotic symptoms: Secondary | ICD-10-CM

## 2021-07-31 NOTE — Telephone Encounter (Signed)
Requested medications are due for refill today.  yes  Requested medications are on the active medications list.  yes  Last refill. 07/06/2021  Future visit scheduled.   yes  Notes to clinic.  Medication not delegated.    Requested Prescriptions  Pending Prescriptions Disp Refills   traMADol (ULTRAM) 50 MG tablet 90 tablet 0    Sig: TAKE 1 TABLET (50 MG TOTAL) BY MOUTH EVERY 8 (EIGHT) HOURS AS NEEDED FOR SEVERE PAIN.     Not Delegated - Analgesics:  Opioid Agonists Failed - 07/31/2021  2:15 PM      Failed - This refill cannot be delegated      Failed - Urine Drug Screen completed in last 360 days      Passed - Valid encounter within last 6 months    Recent Outpatient Visits           1 month ago Fall, initial encounter   [place], Cari S, PA-C   3 months ago Type 2 diabetes mellitus with neurological manifestations [facility])   [place] Elsie Stain, MD   4 months ago No-show for appointment   [place] Elsie Stain, MD   8 months ago Type 2 diabetes mellitus with stage 2 chronic kidney disease, without long-term current use of insulin [facility])   [place] Elsie Stain, MD   11 months ago Type 2 diabetes mellitus with stage 2 chronic kidney disease, without long-term current use of insulin [facility])   [place], MD       Future Appointments             In 1 week Joya Gaskins Burnett Harry, MD [place]

## 2021-08-03 ENCOUNTER — Other Ambulatory Visit: Payer: Self-pay

## 2021-08-03 MED ORDER — TRAMADOL HCL 50 MG PO TABS
ORAL_TABLET | ORAL | 0 refills | Status: DC
  Filled 2021-08-03: qty 90, 30d supply, fill #0

## 2021-08-04 ENCOUNTER — Other Ambulatory Visit: Payer: Self-pay
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
## 2021-08-06 NOTE — Progress Notes (Signed)
Established Patient Office Visit  Subjective:  Patient ID: Donna Nguyen, female    DOB: 1941-06-19  Age: 80 y.o. MRN: [id number]  CC:  Chief Complaint  Patient presents with   Diabetes   Hip Pain    HPI Donna Nguyen presents for primary care follow-up.  Note she now lives in [place] in an apartment.  Unfortunately she had 2 hard falls in the last 3 weeks.  The Rollator that she has does not fit in her car.  She has a folding walker which unfortunately has lost the screws she put some webbing at the bottom of the walker to be able to hold her groceries and the walker came apart.  She fell once in her apartment and twice walking down the hall to get to her front door.  Patient is quite weak and has refused physical therapy.  She does have right-sided hip chronic pain with bone-on-bone.  She does need a hip replacement wishes to hold off.  She is unvaccinated for COVID.  Concerned about coming to the hospital during the [place] pandemic.  Patient maintains her diabetic medications needs complete set of labs and refuses to have fingerstick here in the office for her glucose and A1c  No other complaints at this time. Patient has been seen by clinical social worker and has been very appreciative of this care and has been helpful to her she also maintains Celexa daily    Past Medical History:  Diagnosis Date   Diabetes mellitus without complication ([place])    Erythema migrans (Lyme disease) 11/05/2017   Homelessness 04/18/2020   Hypertension    MI (mitral incompetence)    Stroke [facility])     Past Surgical History:  Procedure Laterality Date   bladdee     BLADDER SURGERY     MOLE REMOVAL      History reviewed. No pertinent family history.  Social History   Socioeconomic History   Marital status: Divorced    Spouse name: Not on file   Number of children: Not on file   Years of education: Not on file   Highest education level: Not on file  Occupational History   Not on file   Tobacco Use   Smoking status: Never   Smokeless tobacco: Never  Substance and Sexual Activity   Alcohol use: Yes    Alcohol/week: 4.0 - 5.0 standard drinks    Types: 1 - 2 Glasses of wine, 2 Cans of beer, 1 Shots of liquor per week    Comment: nightly   Drug use: No   Sexual activity: Yes    Birth control/protection: Post-menopausal  Other Topics Concern   Not on file  Social History Narrative   Not on file   Social Determinants of Health   Financial Resource Strain: Not on file  Food Insecurity: Not on file  Transportation Needs: Not on file  Physical Activity: Not on file  Stress: Not on file  Social Connections: Not on file  Intimate Partner Violence: Not on file    Outpatient Medications Prior to Visit  Medication Sig Dispense Refill   Cholecalciferol (VITAMIN D) 125 MCG (5000 UT) CAPS Take 1 capsule by mouth daily. 60 capsule 1   traMADol (ULTRAM) 50 MG tablet TAKE 1 TABLET (50 MG TOTAL) BY MOUTH EVERY 8 (EIGHT) HOURS AS NEEDED FOR SEVERE PAIN. 90 tablet 0   atorvastatin (LIPITOR) 40 MG tablet Take 1 tablet (40 mg total) by mouth daily. 90 tablet 3  citalopram (CELEXA) 20 MG tablet TAKE 1 TABLET (20 MG TOTAL) BY MOUTH DAILY. 30 tablet 3   empagliflozin (JARDIANCE) 25 MG TABS tablet TAKE 1 TABLET (25 MG TOTAL) BY MOUTH DAILY. 60 tablet 3   gabapentin (NEURONTIN) 300 MG capsule TAKE 1 CAPSULE BY MOUTH 3 TIMES DAILY. MAY TAKE AN EXTRA DOSE ONCE A DAY AS NEEDED. 90 capsule 2   lisinopril (ZESTRIL) 5 MG tablet TAKE 1 TABLET (5 MG TOTAL) BY MOUTH DAILY. 90 tablet 2   metoprolol succinate (TOPROL-XL) 25 MG 24 hr tablet TAKE 1 TABLET (25 MG TOTAL) BY MOUTH DAILY. 90 tablet 0   No facility-administered medications prior to visit.    Allergies  Allergen Reactions   Acetaminophen Shortness Of Breath   Influenza Virus Vaccine Split [Influenza Vac Typ A&B Surf Ant] Swelling   Amitriptyline    Ciprofloxacin    Epinephrine    Penicillins    Metformin And Related Palpitations     ROS Review of Systems  Constitutional:  Negative for chills, diaphoresis and fever.  HENT:  Negative for congestion, hearing loss, nosebleeds, sore throat and tinnitus.   Eyes:  Negative for photophobia and redness.  Respiratory:  Negative for cough, shortness of breath, wheezing and stridor.   Cardiovascular:  Negative for chest pain, palpitations and leg swelling.  Gastrointestinal:  Negative for abdominal pain, blood in stool, constipation, diarrhea, nausea and vomiting.  Endocrine: Negative for polydipsia.  Genitourinary:  Negative for dysuria, flank pain, frequency, hematuria and urgency.  Musculoskeletal:  Positive for gait problem. Negative for back pain, myalgias and neck pain.       Hip pain   Skin:  Negative for rash.  Allergic/Immunologic: Negative for environmental allergies.  Neurological:  Negative for dizziness, tremors, seizures, weakness and headaches.  Hematological:  Does not bruise/bleed easily.  Psychiatric/Behavioral:  Positive for dysphoric mood and sleep disturbance. Negative for self-injury and suicidal ideas. The patient is nervous/anxious.      Objective:    Physical Exam Vitals reviewed.  Constitutional:      Appearance: Normal appearance. She is well-developed. She is obese. She is not diaphoretic.  HENT:     Head: Normocephalic and atraumatic.     Nose: Nose normal. No nasal deformity, septal deviation, mucosal edema or rhinorrhea.     Right Sinus: No maxillary sinus tenderness or frontal sinus tenderness.     Left Sinus: No maxillary sinus tenderness or frontal sinus tenderness.     Mouth/Throat:     Mouth: Mucous membranes are moist.     Pharynx: Oropharynx is clear. No oropharyngeal exudate.  Eyes:     General: No scleral icterus.    Conjunctiva/sclera: Conjunctivae normal.     Pupils: Pupils are equal, round, and reactive to light.  Neck:     Thyroid: No thyromegaly.     Vascular: No carotid bruit or JVD.     Trachea: Trachea normal. No  tracheal tenderness or tracheal deviation.  Cardiovascular:     Rate and Rhythm: Normal rate and regular rhythm.     Chest Wall: PMI is not displaced.     Pulses: Normal pulses. No decreased pulses.     Heart sounds: Normal heart sounds, S1 normal and S2 normal. Heart sounds not distant. No murmur heard. No systolic murmur is present.  No diastolic murmur is present.    No friction rub. No gallop. No S3 or S4 sounds.  Pulmonary:     Effort: Pulmonary effort is normal. No tachypnea, accessory muscle usage  or respiratory distress.     Breath sounds: Normal breath sounds. No stridor. No decreased breath sounds, wheezing, rhonchi or rales.  Chest:     Chest wall: No tenderness.  Abdominal:     General: Bowel sounds are normal. There is no distension.     Palpations: Abdomen is soft. Abdomen is not rigid.     Tenderness: There is no abdominal tenderness. There is no guarding or rebound.  Musculoskeletal:        General: Normal range of motion.     Cervical back: Normal range of motion and neck supple. No edema, erythema or rigidity. No muscular tenderness. Normal range of motion.  Lymphadenopathy:     Head:     Right side of head: No submental or submandibular adenopathy.     Left side of head: No submental or submandibular adenopathy.     Cervical: No cervical adenopathy.  Skin:    General: Skin is warm and dry.     Coloration: Skin is not pale.     Findings: No rash.     Nails: There is no clubbing.  Neurological:     General: No focal deficit present.     Mental Status: She is alert and oriented to person, place, and time. Mental status is at baseline.     Sensory: No sensory deficit.  Psychiatric:        Mood and Affect: Mood normal.        Speech: Speech normal.        Behavior: Behavior normal.        Thought Content: Thought content normal.    BP 111/72    Pulse 82    Resp 16    Wt 197 lb 3.2 oz (89.4 kg)    BMI 32.82 kg/m  Wt Readings from Last 3 Encounters:  08/07/21  197 lb 3.2 oz (89.4 kg)  05/02/21 167 lb 9.6 oz (76 kg)  11/21/20 192 lb 9.6 oz (87.4 kg)     Health Maintenance Due  Topic Date Due   OPHTHALMOLOGY EXAM  06/30/2021    There are no preventive care reminders to display for this patient.  Lab Results  Component Value Date   TSH 3.050 04/18/2020   Lab Results  Component Value Date   WBC 17.9 (H) 05/03/2021   HGB 14.9 05/03/2021   HCT 43.7 05/03/2021   MCV 91 05/03/2021   PLT 269 05/03/2021   Lab Results  Component Value Date   NA 141 05/03/2021   K 4.7 05/03/2021   CO2 22 05/03/2021   GLUCOSE 140 (H) 05/03/2021   BUN 21 05/03/2021   CREATININE 1.39 (H) 05/03/2021   BILITOT 0.4 05/03/2021   ALKPHOS 128 (H) 05/03/2021   AST 15 05/03/2021   ALT 13 05/03/2021   PROT 6.5 05/03/2021   ALBUMIN 4.3 05/03/2021   CALCIUM 9.3 05/03/2021   EGFR 39 (L) 05/03/2021   Lab Results  Component Value Date   CHOL 211 (H) 05/03/2021   Lab Results  Component Value Date   HDL 47 05/03/2021   Lab Results  Component Value Date   LDLCALC 114 (H) 05/03/2021   Lab Results  Component Value Date   TRIG 286 (H) 05/03/2021   Lab Results  Component Value Date   CHOLHDL 4.5 (H) 05/03/2021   Lab Results  Component Value Date   HGBA1C 6.5 (H) 05/03/2021      Assessment & Plan:   Problem List Items Addressed This Visit  Cardiovascular and Mediastinum   Essential hypertension    Hypertension currently stable at this time no change in medications      Relevant Medications   atorvastatin (LIPITOR) 40 MG tablet   lisinopril (ZESTRIL) 5 MG tablet   metoprolol succinate (TOPROL-XL) 25 MG 24 hr tablet   Other Relevant Orders   Comprehensive metabolic panel   CBC with Differential/Platelet     Endocrine   Type 2 diabetes mellitus with neurological manifestations ([place]) - Primary    Type 2 diabetes need to follow-up A1c will obtain blood draw continue Jardiance      Relevant Medications   atorvastatin (LIPITOR) 40 MG  tablet   empagliflozin (JARDIANCE) 25 MG TABS tablet   lisinopril (ZESTRIL) 5 MG tablet   Other Relevant Orders   CBC with Differential/Platelet   Hemoglobin A1c   Lipid panel   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)    Need to check metabolic panel for renal function      Relevant Medications   atorvastatin (LIPITOR) 40 MG tablet   empagliflozin (JARDIANCE) 25 MG TABS tablet   lisinopril (ZESTRIL) 5 MG tablet   Hyperlipidemia associated with type 2 diabetes mellitus (HCC)    Continue statin therapy      Relevant Medications   atorvastatin (LIPITOR) 40 MG tablet   empagliflozin (JARDIANCE) 25 MG TABS tablet   lisinopril (ZESTRIL) 5 MG tablet   metoprolol succinate (TOPROL-XL) 25 MG 24 hr tablet   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)    Patient has regular ophthalmology follow-ups      Relevant Medications   atorvastatin (LIPITOR) 40 MG tablet   empagliflozin (JARDIANCE) 25 MG TABS tablet   lisinopril (ZESTRIL) 5 MG tablet     Musculoskeletal and Integument   Primary osteoarthritis involving multiple joints    With frequent falls  Has severe arthritis of the right hip  We will plan to refer back to orthopedics in the spring        Other   Fall    Continue with frequent falls encouraged use her walker more often      Moderate episode of recurrent major depressive disorder (HCC)    Continue Celexa      Relevant Medications   citalopram (CELEXA) 20 MG tablet   Urge incontinence    Stable at this time      Hoarding behavior    Patient receiving counseling and Celexa although the behavior continues may be not to the same high level      Chronic hip pain, right    Referral to orthopedics is planned in the spring      Relevant Medications   citalopram (CELEXA) 20 MG tablet   gabapentin (NEURONTIN) 300 MG capsule   Other Visit Diagnoses     Diabetic peripheral neuropathy (HCC)        Relevant Medications   atorvastatin (LIPITOR) 40 MG tablet   citalopram (CELEXA) 20 MG tablet   empagliflozin (JARDIANCE) 25 MG TABS tablet   gabapentin (NEURONTIN) 300 MG capsule   lisinopril (ZESTRIL) 5 MG tablet       Meds ordered this encounter  Medications   atorvastatin (LIPITOR) 40 MG tablet    Sig: Take 1 tablet (40 mg total) by mouth daily.    Dispense:  90 tablet    Refill:  3    On next refill   citalopram (CELEXA) 20 MG tablet    Sig:  TAKE 1 TABLET (20 MG TOTAL) BY MOUTH DAILY.    Dispense:  30 tablet    Refill:  3   empagliflozin (JARDIANCE) 25 MG TABS tablet    Sig: TAKE 1 TABLET (25 MG TOTAL) BY MOUTH DAILY.    Dispense:  60 tablet    Refill:  3   gabapentin (NEURONTIN) 300 MG capsule    Sig: TAKE 1 CAPSULE BY MOUTH 3 TIMES DAILY. MAY TAKE AN EXTRA DOSE ONCE A DAY AS NEEDED.    Dispense:  90 capsule    Refill:  2   lisinopril (ZESTRIL) 5 MG tablet    Sig: TAKE 1 TABLET (5 MG TOTAL) BY MOUTH DAILY.    Dispense:  90 tablet    Refill:  2   metoprolol succinate (TOPROL-XL) 25 MG 24 hr tablet    Sig: TAKE 1 TABLET (25 MG TOTAL) BY MOUTH DAILY.    Dispense:  90 tablet    Refill:  2   Accu-Chek Softclix Lancets lancets    Sig: Use as instructed    Dispense:  100 each    Refill:  12   Blood Glucose Monitoring Suppl (ACCU-CHEK GUIDE) w/Device KIT    Sig: Use to check blood sugar    Dispense:  1 kit    Refill:  0   glucose blood (ACCU-CHEK GUIDE) test strip    Sig: Use as instructed    Dispense:  100 each    Refill:  12    Follow-up: Return in about 3 months (around 11/05/2021).    Asencion Noble, MD

## 2021-08-07 ENCOUNTER — Encounter: Payer: Self-pay | Admitting: Critical Care Medicine

## 2021-08-07 ENCOUNTER — Other Ambulatory Visit: Payer: Self-pay

## 2021-08-07 ENCOUNTER — Ambulatory Visit: Payer: Medicare Other | Attending: Critical Care Medicine | Admitting: Critical Care Medicine

## 2021-08-07 VITALS — BP 111/72 | HR 82 | Resp 16 | Wt 197.2 lb

## 2021-08-07 DIAGNOSIS — E785 Hyperlipidemia, unspecified: Secondary | ICD-10-CM

## 2021-08-07 DIAGNOSIS — I10 Essential (primary) hypertension: Secondary | ICD-10-CM | POA: Diagnosis not present

## 2021-08-07 DIAGNOSIS — M159 Polyosteoarthritis, unspecified: Secondary | ICD-10-CM | POA: Diagnosis not present

## 2021-08-07 DIAGNOSIS — G8929 Other chronic pain: Secondary | ICD-10-CM

## 2021-08-07 DIAGNOSIS — E1169 Type 2 diabetes mellitus with other specified complication: Secondary | ICD-10-CM | POA: Diagnosis not present

## 2021-08-07 DIAGNOSIS — E1122 Type 2 diabetes mellitus with diabetic chronic kidney disease: Secondary | ICD-10-CM | POA: Diagnosis not present

## 2021-08-07 DIAGNOSIS — N182 Chronic kidney disease, stage 2 (mild): Secondary | ICD-10-CM

## 2021-08-07 DIAGNOSIS — E113293 Type 2 diabetes mellitus with mild nonproliferative diabetic retinopathy without macular edema, bilateral: Secondary | ICD-10-CM

## 2021-08-07 DIAGNOSIS — N3941 Urge incontinence: Secondary | ICD-10-CM

## 2021-08-07 DIAGNOSIS — F423 Hoarding disorder: Secondary | ICD-10-CM

## 2021-08-07 DIAGNOSIS — F331 Major depressive disorder, recurrent, moderate: Secondary | ICD-10-CM

## 2021-08-07 DIAGNOSIS — M25551 Pain in right hip: Secondary | ICD-10-CM

## 2021-08-07 DIAGNOSIS — E1142 Type 2 diabetes mellitus with diabetic polyneuropathy: Secondary | ICD-10-CM

## 2021-08-07 DIAGNOSIS — E1149 Type 2 diabetes mellitus with other diabetic neurological complication: Secondary | ICD-10-CM

## 2021-08-07 DIAGNOSIS — W19XXXA Unspecified fall, initial encounter: Secondary | ICD-10-CM

## 2021-08-07 MED ORDER — ACCU-CHEK SOFTCLIX LANCETS MISC
12 refills | Status: AC
  Filled 2021-08-07: qty 100, 33d supply, fill #0

## 2021-08-07 MED ORDER — EMPAGLIFLOZIN 25 MG PO TABS
ORAL_TABLET | Freq: Every day | ORAL | 3 refills | Status: DC
  Filled 2021-11-02: qty 60, 60d supply, fill #0

## 2021-08-07 MED ORDER — LISINOPRIL 5 MG PO TABS
ORAL_TABLET | Freq: Every day | ORAL | 2 refills | Status: DC
  Filled 2021-11-02: qty 90, 90d supply, fill #0

## 2021-08-07 MED ORDER — ATORVASTATIN CALCIUM 40 MG PO TABS
40.0000 mg | ORAL_TABLET | Freq: Every day | ORAL | 3 refills | Status: DC
  Filled 2021-11-02: qty 90, 90d supply, fill #0

## 2021-08-07 MED ORDER — METOPROLOL SUCCINATE ER 25 MG PO TB24
ORAL_TABLET | Freq: Every day | ORAL | 2 refills | Status: DC
  Filled 2021-08-07: qty 90, 90d supply, fill #0

## 2021-08-07 MED ORDER — GABAPENTIN 300 MG PO CAPS
ORAL_CAPSULE | ORAL | 2 refills | Status: DC
  Filled 2021-08-07: qty 90, fill #0
  Filled 2021-09-06: qty 90, 30d supply, fill #0
  Filled 2021-09-22: qty 90, 22d supply, fill #1
  Filled 2021-11-02: qty 90, 22d supply, fill #2

## 2021-08-07 MED ORDER — ACCU-CHEK GUIDE W/DEVICE KIT
PACK | 0 refills | Status: AC
  Filled 2021-08-07: qty 1, 1d supply, fill #0

## 2021-08-07 MED ORDER — CITALOPRAM HYDROBROMIDE 20 MG PO TABS
ORAL_TABLET | Freq: Every day | ORAL | 3 refills | Status: DC
  Filled 2021-11-02: qty 30, 30d supply, fill #0

## 2021-08-07 MED ORDER — ACCU-CHEK GUIDE VI STRP
ORAL_STRIP | 12 refills | Status: AC
  Filled 2021-08-07: qty 100, 33d supply, fill #0

## 2021-08-07 NOTE — Assessment & Plan Note (Signed)
Patient has regular ophthalmology follow-ups

## 2021-08-07 NOTE — Assessment & Plan Note (Signed)
Hypertension currently stable at this time no change in medications

## 2021-08-07 NOTE — Assessment & Plan Note (Signed)
Referral to orthopedics is planned in the spring

## 2021-08-07 NOTE — Assessment & Plan Note (Signed)
Type 2 diabetes need to follow-up A1c will obtain blood draw continue Jardiance

## 2021-08-07 NOTE — Assessment & Plan Note (Signed)
With frequent falls  Has severe arthritis of the right hip  We will plan to refer back to orthopedics in the spring

## 2021-08-07 NOTE — Assessment & Plan Note (Signed)
Need to check metabolic panel for renal function

## 2021-08-07 NOTE — Assessment & Plan Note (Signed)
Continue with frequent falls encouraged use her walker more often

## 2021-08-07 NOTE — Assessment & Plan Note (Signed)
Continue statin therapy.

## 2021-08-07 NOTE — Assessment & Plan Note (Signed)
Stable at this time 

## 2021-08-07 NOTE — Assessment & Plan Note (Signed)
Patient receiving counseling and Celexa although the behavior continues may be not to the same high level

## 2021-08-07 NOTE — Assessment & Plan Note (Signed)
Continue Celexa

## 2021-08-07 NOTE — Patient Instructions (Signed)
Refill on all medications sent to her pharmacy  New glucose meter was sent to our pharmacy with testing supplies  Please get your walker replaced the current one is nonfunctional and you will fall again  Complete set of blood draws obtained today  Referral to orthopedics will be made in March timeframe when it warms up after the COVID peak subsides  Return to see Dr. Joya Gaskins 3 months

## 2021-08-08 ENCOUNTER — Other Ambulatory Visit: Payer: Self-pay

## 2021-08-08 ENCOUNTER — Telehealth: Payer: Self-pay

## 2021-08-08 LAB — COMPREHENSIVE METABOLIC PANEL
ALT: 26 IU/L (ref 0–32)
AST: 19 IU/L (ref 0–40)
Albumin/Globulin Ratio: 2.1 (ref 1.2–2.2)
Albumin: 4.5 g/dL (ref 3.7–4.7)
Alkaline Phosphatase: 122 IU/L — ABNORMAL HIGH (ref 44–121)
BUN/Creatinine Ratio: 19 (ref 12–28)
BUN: 26 mg/dL (ref 8–27)
Bilirubin Total: 0.4 mg/dL (ref 0.0–1.2)
CO2: 22 mmol/L (ref 20–29)
Calcium: 9.5 mg/dL (ref 8.7–10.3)
Chloride: 100 mmol/L (ref 96–106)
Creatinine, Ser: 1.4 mg/dL — ABNORMAL HIGH (ref 0.57–1.00)
Globulin, Total: 2.1 g/dL (ref 1.5–4.5)
Glucose: 132 mg/dL — ABNORMAL HIGH (ref 70–99)
Potassium: 4.4 mmol/L (ref 3.5–5.2)
Sodium: 138 mmol/L (ref 134–144)
Total Protein: 6.6 g/dL (ref 6.0–8.5)
eGFR: 39 mL/min/{1.73_m2} — ABNORMAL LOW (ref >59)

## 2021-08-08 LAB — CBC WITH DIFFERENTIAL/PLATELET
Basophils Absolute: 0.1 10*3/uL (ref 0.0–0.2)
Basos: 1 %
EOS (ABSOLUTE): 0.1 10*3/uL (ref 0.0–0.4)
Eos: 1 %
Hematocrit: 43.5 % (ref 34.0–46.6)
Hemoglobin: 14.6 g/dL (ref 11.1–15.9)
Immature Grans (Abs): 0 10*3/uL (ref 0.0–0.1)
Immature Granulocytes: 0 %
Lymphocytes Absolute: 6.8 10*3/uL — ABNORMAL HIGH (ref 0.7–3.1)
Lymphs: 47 %
MCH: 30 pg (ref 26.6–33.0)
MCHC: 33.6 g/dL (ref 31.5–35.7)
MCV: 90 fL (ref 79–97)
Monocytes Absolute: 0.8 10*3/uL (ref 0.1–0.9)
Monocytes: 6 %
Neutrophils Absolute: 6.5 10*3/uL (ref 1.4–7.0)
Neutrophils: 45 %
Platelets: 271 10*3/uL (ref 150–450)
RBC: 4.86 x10E6/uL (ref 3.77–5.28)
RDW: 12.7 % (ref 11.7–15.4)
WBC: 14.4 10*3/uL — ABNORMAL HIGH (ref 3.4–10.8)

## 2021-08-08 LAB — LIPID PANEL
Chol/HDL Ratio: 4.8 ratio — ABNORMAL HIGH (ref 0.0–4.4)
Cholesterol, Total: 197 mg/dL (ref 100–199)
HDL: 41 mg/dL (ref >39)
LDL Chol Calc (NIH): 107 mg/dL — ABNORMAL HIGH (ref 0–99)
Triglycerides: 287 mg/dL — ABNORMAL HIGH (ref 0–149)
VLDL Cholesterol Cal: 49 mg/dL — ABNORMAL HIGH (ref 5–40)

## 2021-08-08 LAB — HEMOGLOBIN A1C
Est. average glucose Bld gHb Est-mCnc: 146 mg/dL
Hgb A1c MFr Bld: 6.7 % — ABNORMAL HIGH (ref 4.8–5.6)

## 2021-08-08 NOTE — Telephone Encounter (Signed)
-----   Message from Elsie Stain, MD sent at 08/08/2021  7:52 AM EST ----- Let pt know labs all normal  A1C is better at 6.7  no med changes

## 2021-08-08 NOTE — Telephone Encounter (Signed)
Called pt and went straight to voicemail, no message left due to mailbox not being set up. Information sent to nurse pool.

## 2021-08-10 ENCOUNTER — Other Ambulatory Visit: Payer: Self-pay

## 2021-08-28 ENCOUNTER — Ambulatory Visit: Payer: Medicare Other | Admitting: Clinical

## 2021-09-04 ENCOUNTER — Other Ambulatory Visit: Payer: Self-pay

## 2021-09-04 ENCOUNTER — Ambulatory Visit: Payer: Commercial Managed Care - HMO | Attending: Critical Care Medicine | Admitting: Clinical

## 2021-09-04 ENCOUNTER — Other Ambulatory Visit: Payer: Self-pay | Admitting: Critical Care Medicine

## 2021-09-04 DIAGNOSIS — E113293 Type 2 diabetes mellitus with mild nonproliferative diabetic retinopathy without macular edema, bilateral: Secondary | ICD-10-CM

## 2021-09-04 DIAGNOSIS — F333 Major depressive disorder, recurrent, severe with psychotic symptoms: Secondary | ICD-10-CM

## 2021-09-06 ENCOUNTER — Other Ambulatory Visit: Payer: Self-pay

## 2021-09-08 NOTE — BH Specialist Note (Signed)
Integrated Behavioral Health Follow Up In-Person Visit  MRN: [id number] Name: Donna Nguyen  Number of [facility] Clinician visits: 5/6 Session Start time: 10:30am  Session End time: 11:20am Total time: 50  minutes  Types of Service: Individual psychotherapy  Interpretor:No. Interpretor Name and Language: N/A  Subjective: Donna Nguyen is a 81 y.o. female accompanied by  self Patient was referred by PCP Asencion Noble, MD for depression. Patient reports the following symptoms/concerns: Reports feeling depressed, anxiousness, and restlessness. Reports that she continues to worry about her health. Reports that she often worries about getting into the shower and not falling. Reports that she had a fall since previous session. Reports worrying that she may need someone to take care of her and her apartment. Duration of problem: 5 years; Severity of problem: severe  Objective: Mood: Anxious and Depressed and Affect: Appropriate Risk of harm to self or others: No plan to harm self or others  Life Context: Family and Social: Pt has limited support system as she does not speak to family. 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 Reports limited coping skills. Life Changes: Pt was kicked out of her home several years ago and experienced homelessness as a result. Pt currently stays in an apartment. Pt is experiencing physical health changes. (No changes to life context)  Patient and/or Family's Strengths/Protective Factors: Health support and resiliency  Goals Addressed: Patient will:  Reduce symptoms of: anxiety and depression   Increase knowledge and/or ability of: coping skills and healthy habits   Demonstrate ability to: Increase healthy adjustment to current life circumstances and Increase adequate support systems for patient/family  Progress towards Goals: Ongoing  Interventions: Interventions utilized:  Mindfulness or  Relaxation Training, CBT Cognitive Behavioral Therapy, and Supportive Counseling Standardized Assessments completed: Not Needed  Patient and/or Family Response: Pt has difficulty being receptive to home health or any assisted living. Pt receptive to identifying the positives and negatives with receiving home health assistance. Pt receptive to cognitive restructuring and assistance with cognitive processing skills. Pt receptive to deep breathing and journaling. Pt will continue processing needing an aide in the home.  Patient Centered Plan: Patient is on the following Treatment Plan(s): Depression and anxiety  Assessment: Denies SI/HI. Patient currently experiencing physical health problems contributing to pt feeling depressed. Pt has difficulty accepting her health changes. Pt also has difficulty with cognitive processing.   Patient may benefit from continuing brief interventions and getting a home health aide. LCSWA assisted pt in identifying the positive and negatives to receiving home health assistance. LCSWA assisted pt with cognitive processing skills and utilized cognitive restructuring. LCSWA encouraged pt to utilize deep breathing, journaling, and continue processing the idea of receiving home health assistance.  Plan: Follow up with behavioral health clinician on : 09/27/21 Behavioral recommendations: Utilize deep breathing and journaling.  Referral(s): [place] (In Clinic) "From scale of 1-10, how likely are you to follow plan?": 10  Tamyah Cutbirth C Salvadore Valvano, LCSW

## 2021-09-21 ENCOUNTER — Other Ambulatory Visit: Payer: Self-pay

## 2021-09-21 ENCOUNTER — Other Ambulatory Visit: Payer: Self-pay | Admitting: Critical Care Medicine

## 2021-09-22 ENCOUNTER — Other Ambulatory Visit: Payer: Self-pay

## 2021-09-25 ENCOUNTER — Other Ambulatory Visit: Payer: Self-pay

## 2021-09-25 MED ORDER — TRAMADOL HCL 50 MG PO TABS
ORAL_TABLET | ORAL | 0 refills | Status: DC
  Filled 2021-09-25: qty 90, 30d supply, fill #0

## 2021-09-27 ENCOUNTER — Ambulatory Visit: Payer: Medicare Other | Attending: Critical Care Medicine | Admitting: Clinical

## 2021-09-27 ENCOUNTER — Other Ambulatory Visit: Payer: Self-pay

## 2021-09-27 ENCOUNTER — Encounter: Payer: Self-pay | Admitting: Physician Assistant

## 2021-09-27 ENCOUNTER — Encounter: Payer: Self-pay | Admitting: Clinical

## 2021-09-27 ENCOUNTER — Ambulatory Visit (HOSPITAL_BASED_OUTPATIENT_CLINIC_OR_DEPARTMENT_OTHER): Payer: Medicare Other | Admitting: Physician Assistant

## 2021-09-27 VITALS — BP 164/76 | HR 70 | Resp 16 | Wt 204.0 lb

## 2021-09-27 DIAGNOSIS — R609 Edema, unspecified: Secondary | ICD-10-CM

## 2021-09-27 DIAGNOSIS — W19XXXD Unspecified fall, subsequent encounter: Secondary | ICD-10-CM

## 2021-09-27 DIAGNOSIS — F333 Major depressive disorder, recurrent, severe with psychotic symptoms: Secondary | ICD-10-CM | POA: Diagnosis not present

## 2021-09-27 DIAGNOSIS — I10 Essential (primary) hypertension: Secondary | ICD-10-CM | POA: Diagnosis not present

## 2021-09-27 DIAGNOSIS — S60211D Contusion of right wrist, subsequent encounter: Secondary | ICD-10-CM

## 2021-09-27 MED ORDER — FUROSEMIDE 20 MG PO TABS
20.0000 mg | ORAL_TABLET | Freq: Every day | ORAL | 0 refills | Status: DC
  Filled 2021-09-27: qty 5, 5d supply, fill #0

## 2021-09-27 NOTE — Progress Notes (Signed)
Donna Nguyen, is a 81 y.o. female  [id number]:[id number]  STM:[id number]  DOB - 02/11/1941  Chief Complaint  Patient presents with   Joint Swelling    B/l ankle    Hip Pain    Right        Subjective:   Donna Nguyen is a 81 y.o. female here today for a visit with [place] when she also mentions she had 4 falls onto R wrist in the past couple of weeks.  She has not taken BP meds in about 1 weeks.  And the lower part of her bed frame came disconnected and she has been sleeping with her legs below her head.  No head injury with falls. R wrist seems to be improving.    No problems updated.  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 Authorizing Provider  furosemide (LASIX) 20 MG tablet Take 1 tablet (20 mg total) by mouth daily. For swelling 09/27/21  Yes Argentina Donovan, PA-C  Accu-Chek Softclix Lancets lancets Use as instructed 08/07/21   Elsie Stain, MD  atorvastatin (LIPITOR) 40 MG tablet Take 1 tablet (40 mg total) by mouth daily. 08/07/21 08/07/22  Elsie Stain, MD  Blood Glucose Monitoring Suppl (ACCU-CHEK GUIDE) w/Device KIT Use to check blood sugar 08/07/21   Elsie Stain, MD  Cholecalciferol (VITAMIN D) 125 MCG (5000 UT) CAPS Take 1 capsule by mouth daily. 05/02/21   Elsie Stain, MD  citalopram (CELEXA) 20 MG tablet TAKE 1 TABLET (20 MG TOTAL) BY MOUTH DAILY. 08/07/21 08/07/22  Elsie Stain,  MD  empagliflozin (JARDIANCE) 25 MG TABS tablet TAKE 1 TABLET (25 MG TOTAL) BY MOUTH DAILY. 08/07/21 08/07/22  Elsie Stain, MD  gabapentin (NEURONTIN) 300 MG capsule TAKE 1 CAPSULE BY MOUTH 3 TIMES DAILY. MAY TAKE AN EXTRA DOSE ONCE A DAY AS NEEDED. 08/07/21   Elsie Stain, MD  glucose blood (ACCU-CHEK GUIDE) test strip Use as instructed 08/07/21   Elsie Stain, MD  lisinopril (ZESTRIL) 5 MG tablet TAKE 1 TABLET (5 MG TOTAL) BY MOUTH DAILY. 08/07/21 08/07/22  Elsie Stain, MD  metoprolol succinate (TOPROL-XL) 25 MG 24 hr tablet TAKE 1 TABLET (25 MG TOTAL) BY MOUTH DAILY. 08/07/21 08/07/22  Elsie Stain, MD  traMADol (ULTRAM) 50 MG tablet TAKE 1 TABLET (50 MG TOTAL) BY MOUTH EVERY 8 (EIGHT) HOURS AS NEEDED FOR SEVERE PAIN. 09/25/21 03/26/22  Elsie Stain, MD    ROS: Neg HEENT Neg resp Neg cardiac Neg GI Neg GU Neg MS Neg psych Neg neuro  Objective:   Vitals:   09/27/21 1453  BP: (!) 164/76  Pulse: 70  Resp: 16  SpO2: 99%  Weight: 204 lb (92.5 kg)   Exam General appearance : Awake, alert, not in any distress. Speech Clear. Not toxic looking HEENT: Atraumatic and Normocephalic Neck: Supple, no JVD. No cervical lymphadenopathy.  Chest: Good air entry bilaterally, CTAB.  No rales/rhonchi/wheezing CVS: S1 S2 regular, no murmurs.  R wrist and snuffbox without point tenderness.  No bruising or swelling.  Normal ROM Extremities: B/L Lower Ext shows 2+ edema, both legs are warm to touch Neurology: Awake alert, and oriented X 3, CN II-XII intact, Non focal Skin: No Rash  Data Review Lab Results  Component Value Date   HGBA1C 6.7 (H) 08/07/2021   HGBA1C 6.5 (H) 05/03/2021   HGBA1C 7.4 (A) 11/21/2020    Assessment & Plan   1. Fall, subsequent encounter - DG Wrist 2 Views Right; Future  2. Contusion of right wrist, subsequent encounter Doubt broken and already improving.  She can go for xray if it doesn't continue to improve - DG Wrist 2 Views  Right; Future  3. Edema, unspecified type - furosemide (LASIX) 20 MG tablet; Take 1 tablet (20 mg total) by mouth daily. For swelling  Dispense: 5 tablet; Refill: 0 Get bedframe repaired and elevate legs as needed  4. Essential hypertension Take meds as prescribed(not taking X 1 week)    Patient have been counseled extensively about nutrition and exercise. Other issues discussed during this visit include: low cholesterol diet, weight control and daily exercise, foot care, annual eye examinations at Ophthalmology, importance of adherence with medications and regular follow-up. We also discussed long term complications of uncontrolled diabetes and hypertension.   Return for keep upcoming appt with dr Joya Gaskins.  The patient was given clear instructions to go to ER or return to medical center if symptoms don't improve, worsen or new problems develop. The patient verbalized understanding. The patient was told to call to get lab results if they haven't heard anything in the next week.      Donna Caldron, PA-C [facility] and [place], [place]   09/27/2021, 3:07 PM Patient ID: LARON ANGELINI, female   DOB: 06-03-1941, 81 y.o.   MRN: [id number]

## 2021-10-04 NOTE — BH Specialist Note (Signed)
Integrated Behavioral Health Follow Up In-Person Visit  MRN: [id number] Name: Donna Nguyen  Number of [place] [facility] Clinician visits: 5-Fifth Visit  Session Start time: 1330   Session End time: 1430  Total time in minutes: 60   Types of Service: Individual psychotherapy  Interpretor:No. Interpretor Name and Language: N/A  Subjective: Donna Nguyen is a 81 y.o. female accompanied by  self Patient was referred by Asencion Noble, MD for depression. Patient reports the following symptoms/concerns: Reports feeling depressed at times, anxious, and restless. Reports continued worries about her physical health. Report that she fell again. Reports that she wants to straighten up her apartment but finds it difficult to stand for extended time.  Duration of problem: 5 years; Severity of problem: severe  Objective: Mood: Anxious and Depressed and Affect: Appropriate Risk of harm to self or others: No plan to harm self or others  Life Context: Family and Social: Pt has limited support system as she does not speak to family. 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 Reports limited coping skills. Life Changes: Pt was kicked out of her home several years ago and experienced homelessness as a result. Pt currently stays in an apartment. Pt is experiencing physical health changes. (No changes to life context)  Patient and/or Family's Strengths/Protective Factors: Health support and resiliency  Goals Addressed: Patient will:  Reduce symptoms of: anxiety and depression   Increase knowledge and/or ability of: coping skills and healthy habits   Demonstrate ability to: Increase healthy adjustment to current life circumstances and Increase adequate support systems for patient/family  Progress towards Goals: Ongoing  Interventions: Interventions utilized:  Motivational Interviewing, Mindfulness or Psychologist, educational, CBT Cognitive Behavioral  Therapy, and Supportive Counseling Standardized Assessments completed: Not Needed  Patient and/or Family Response: Pt has difficulty being receptive to support in the home and outpatient therapy. Pt frequently changed subjective. Pt is receptive to deep breathing and journaling.   Patient Centered Plan: Patient is on the following Treatment Plan(s): Depression and anxiety  Assessment: Denies SI/HI. Patient currently experiencing depression. Pt is also experiencing disorganized thinking and difficulty with cognitive processing. Pt continues to focus on her losing her home several years ago and has difficulty processing the events that led up to her losing it. Pt frequently feels alone however is not motivated to have support in her home.   Patient may benefit from brief interventions due to pt not being receptive outpatient therapy. LCSW utilized motivational interviewing to improve pt's motivation for change. LCSW utilized Adult nurse. LCSW informed RN care manager about pt's falls and pt was seen by a provider. LCSW will fu with pt.  Plan: Follow up with behavioral health clinician on : 11/06/21 Behavioral recommendations: Utilize deep breathing and journaling. Referral(s): [place] (In Clinic) "From scale of 1-10, how likely are you to follow plan?": 10  Masako Overall C Kabao Leite, LCSW

## 2021-11-02 ENCOUNTER — Other Ambulatory Visit: Payer: Self-pay | Admitting: Critical Care Medicine

## 2021-11-02 ENCOUNTER — Other Ambulatory Visit: Payer: Self-pay

## 2021-11-02 MED ORDER — TRAMADOL HCL 50 MG PO TABS
ORAL_TABLET | ORAL | 0 refills | Status: DC
  Filled 2021-11-02: qty 90, 30d supply, fill #0

## 2021-11-05 NOTE — Progress Notes (Signed)
? ? ?Established Patient Office Visit ? ?Subjective:  ?Patient ID: Donna Nguyen, female    DOB: September 11, 1940  Age: 81 y.o. MRN: [id number] ? ?CC:  ?No chief complaint on file. ? ? ?HPI ?08/07/21 ? ?Donna Nguyen presents for primary care follow-up.  Note she now lives in [place] in an apartment.  Unfortunately she had 2 hard falls in the last 3 weeks.  The Rollator that she has does not fit in her car.  She has a folding walker which unfortunately has lost the screws she put some webbing at the bottom of the walker to be able to hold her groceries and the walker came apart.  She fell once in her apartment and twice walking down the hall to get to her front door.  Patient is quite weak and has refused physical therapy.  She does have right-sided hip chronic pain with bone-on-bone.  She does need a hip replacement wishes to hold off.  She is unvaccinated for COVID.  Concerned about coming to the hospital during the [place] pandemic.  Patient maintains her diabetic medications needs complete set of labs and refuses to have fingerstick here in the office for her glucose and A1c ? ?No other complaints at this time. ?Patient has been seen by clinical social worker and has been very appreciative of this care and has been helpful to her she also maintains Celexa daily ? ?11/06/21 ?Patient seen in return follow-up and has had 5 falls since January in her home.  She has had chronic right hip pain that is worsened since the falls also her right wrist has been hurting as well.  The shoes she is wearing are worn out and make it difficult to ambulate.  She is not able to get into a store to purchase more shoes. ? ?The last visit her A1c was 6.7.  She is only eating 1 healthy meal a day.  She has no other real complaints.  She does need an eye exam she states she will receive that in this calendar year.  She agrees to receive the pneumonia vaccine at this visit. ?Patient has significant toenail fungus and would benefit from  podiatry referral she agrees to receive this. ? ? ?Past Medical History:  ?Diagnosis Date  ? Diabetes mellitus without complication ([place])   ? 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 Homelessness 04/18/2020  ? Hypertension   ? MI (mitral incompetence)   ? Stroke [facility])   ? ? ?Past Surgical History:  ?Procedure Laterality Date  ? bladdee    ? BLADDER SURGERY    ? MOLE REMOVAL    ? ? ?No family history on file. ? ?Social History  ? ?Socioeconomic History  ? Marital status: Divorced  ?  Spouse name: Not on file  ? Number of children: Not on file  ? Years of education: Not on file  ? Highest education level: Not on file  ?Occupational History  ? Not on file  ?Tobacco Use  ? Smoking status: Never  ? Smokeless tobacco: Never  ?Substance and Sexual Activity  ? Alcohol use: Yes  ?  Alcohol/week: 4.0 - 5.0 standard drinks  ?  Types: 1 - 2 Glasses of wine, 2 Cans of beer, 1 Shots of liquor per week  ?  Comment: nightly  ? Drug use: No  ? Sexual activity: Yes  ?  Birth control/protection: Post-menopausal  ?Other Topics Concern  ? Not on file  ?Social History Narrative  ? Not on  file  ? ?Social Determinants of Health  ? ?Financial Resource Strain: Not on file  ?Food Insecurity: Not on file  ?Transportation Needs: Not on file  ?Physical Activity: Not on file  ?Stress: Not on file  ?Social Connections: Not on file  ?Intimate Partner Violence: Not on file  ? ? ?Outpatient Medications Prior to Visit  ?Medication Sig Dispense Refill  ? Accu-Chek Softclix Lancets lancets Use as instructed 100 each 12  ? Blood Glucose Monitoring Suppl (ACCU-CHEK GUIDE) w/Device KIT Use to check blood sugar 1 kit 0  ? Cholecalciferol (VITAMIN D) 125 MCG (5000 UT) CAPS Take 1 capsule by mouth daily. 60 capsule 1  ? glucose blood (ACCU-CHEK GUIDE) test strip Use as instructed 100 each 12  ? traMADol (ULTRAM) 50 MG tablet TAKE 1 TABLET (50 MG TOTAL) BY MOUTH EVERY 8 (EIGHT) HOURS AS NEEDED FOR SEVERE PAIN. 90 tablet 0  ? atorvastatin (LIPITOR) 40 MG  tablet Take 1 tablet (40 mg total) by mouth daily. 90 tablet 3  ? citalopram (CELEXA) 20 MG tablet TAKE 1 TABLET (20 MG TOTAL) BY MOUTH DAILY. 30 tablet 3  ? empagliflozin (JARDIANCE) 25 MG TABS tablet TAKE 1 TABLET (25 MG TOTAL) BY MOUTH DAILY. 60 tablet 3  ? gabapentin (NEURONTIN) 300 MG capsule TAKE 1 CAPSULE BY MOUTH 3 TIMES DAILY. MAY TAKE AN EXTRA DOSE ONCE A DAY AS NEEDED. 90 capsule 2  ? lisinopril (ZESTRIL) 5 MG tablet TAKE 1 TABLET (5 MG TOTAL) BY MOUTH DAILY. 90 tablet 2  ? metoprolol succinate (TOPROL-XL) 25 MG 24 hr tablet TAKE 1 TABLET (25 MG TOTAL) BY MOUTH DAILY. 90 tablet 2  ? furosemide (LASIX) 20 MG tablet Take 1 tablet (20 mg total) by mouth daily. For swelling (Patient not taking: Reported on 11/06/2021) 5 tablet 0  ? ?No facility-administered medications prior to visit.  ? ? ?Allergies  ?Allergen Reactions  ? Acetaminophen Shortness Of Breath  ? Influenza Virus Vaccine Split [Influenza Vac Typ A&B Surf Ant] Swelling  ? Amitriptyline   ? Ciprofloxacin   ? Epinephrine   ? Penicillins   ? Metformin And Related Palpitations  ? ? ?ROS ?Review of Systems  ?Constitutional:  Negative for chills, diaphoresis and fever.  ?HENT:  Negative for congestion, hearing loss, nosebleeds, sore throat and tinnitus.   ?Eyes:  Negative for photophobia and redness.  ?Respiratory:  Negative for cough, shortness of breath, wheezing and stridor.   ?Cardiovascular:  Negative for chest pain, palpitations and leg swelling.  ?Gastrointestinal:  Negative for abdominal pain, blood in stool, constipation, diarrhea, nausea and vomiting.  ?Endocrine: Negative for polydipsia.  ?Genitourinary:  Negative for dysuria, flank pain, frequency, hematuria and urgency.  ?Musculoskeletal:  Positive for gait problem. Negative for back pain, myalgias and neck pain.  ?     Hip pain ?Onychomycosis of toenails  ?Skin:  Negative for rash.  ?Allergic/Immunologic: Negative for environmental allergies.  ?Neurological:  Negative for dizziness,  tremors, seizures, weakness and headaches.  ?Hematological:  Does not bruise/bleed easily.  ?Psychiatric/Behavioral:  Negative for dysphoric mood, self-injury, sleep disturbance and suicidal ideas. The patient is not nervous/anxious.   ? ?  ?Objective:  ?  ?Physical Exam ?Vitals reviewed.  ?Constitutional:   ?   Appearance: Normal appearance. She is well-developed. She is obese. She is not diaphoretic.  ?HENT:  ?   Head: Normocephalic and atraumatic.  ?   Nose: Nose normal. No nasal deformity, septal deviation, mucosal edema or rhinorrhea.  ?   Right Sinus: No  maxillary sinus tenderness or frontal sinus tenderness.  ?   Left Sinus: No maxillary sinus tenderness or frontal sinus tenderness.  ?   Mouth/Throat:  ?   Mouth: Mucous membranes are moist.  ?   Pharynx: Oropharynx is clear. No oropharyngeal exudate.  ?Eyes:  ?   General: No scleral icterus. ?   Conjunctiva/sclera: Conjunctivae normal.  ?   Pupils: Pupils are equal, round, and reactive to light.  ?Neck:  ?   Thyroid: No thyromegaly.  ?   Vascular: No carotid bruit or JVD.  ?   Trachea: Trachea normal. No tracheal tenderness or tracheal deviation.  ?Cardiovascular:  ?   Rate and Rhythm: Normal rate and regular rhythm.  ?   Chest Wall: PMI is not displaced.  ?   Pulses: Normal pulses. No decreased pulses.  ?   Heart sounds: Normal heart sounds, S1 normal and S2 normal. Heart sounds not distant. No murmur heard. ?No systolic murmur is present.  ?No diastolic murmur is present.  ?  No friction rub. No gallop. No S3 or S4 sounds.  ?Pulmonary:  ?   Effort: Pulmonary effort is normal. No tachypnea, accessory muscle usage or respiratory distress.  ?   Breath sounds: Normal breath sounds. No stridor. No decreased breath sounds, wheezing, rhonchi or rales.  ?Chest:  ?   Chest wall: No tenderness.  ?Abdominal:  ?   General: Bowel sounds are normal. There is no distension.  ?   Palpations: Abdomen is soft. Abdomen is not rigid.  ?   Tenderness: There is no abdominal  tenderness. There is no guarding or rebound.  ?Musculoskeletal:     ?   General: Deformity present. Normal range of motion.  ?   Cervical back: Normal range of motion and neck supple. No edema, erythema or rigidity.

## 2021-11-06 ENCOUNTER — Ambulatory Visit: Payer: Medicare Other | Attending: Critical Care Medicine | Admitting: Clinical

## 2021-11-06 ENCOUNTER — Other Ambulatory Visit: Payer: Self-pay

## 2021-11-06 ENCOUNTER — Encounter: Payer: Self-pay | Admitting: Critical Care Medicine

## 2021-11-06 ENCOUNTER — Ambulatory Visit: Payer: Medicare Other | Attending: Critical Care Medicine | Admitting: Critical Care Medicine

## 2021-11-06 VITALS — BP 143/90 | HR 70 | Wt 206.2 lb

## 2021-11-06 DIAGNOSIS — E1169 Type 2 diabetes mellitus with other specified complication: Secondary | ICD-10-CM

## 2021-11-06 DIAGNOSIS — M25551 Pain in right hip: Secondary | ICD-10-CM

## 2021-11-06 DIAGNOSIS — E1149 Type 2 diabetes mellitus with other diabetic neurological complication: Secondary | ICD-10-CM

## 2021-11-06 DIAGNOSIS — F331 Major depressive disorder, recurrent, moderate: Secondary | ICD-10-CM

## 2021-11-06 DIAGNOSIS — M25531 Pain in right wrist: Secondary | ICD-10-CM

## 2021-11-06 DIAGNOSIS — N182 Chronic kidney disease, stage 2 (mild): Secondary | ICD-10-CM

## 2021-11-06 DIAGNOSIS — E1142 Type 2 diabetes mellitus with diabetic polyneuropathy: Secondary | ICD-10-CM

## 2021-11-06 DIAGNOSIS — F333 Major depressive disorder, recurrent, severe with psychotic symptoms: Secondary | ICD-10-CM | POA: Diagnosis not present

## 2021-11-06 DIAGNOSIS — E1122 Type 2 diabetes mellitus with diabetic chronic kidney disease: Secondary | ICD-10-CM | POA: Diagnosis not present

## 2021-11-06 DIAGNOSIS — M150 Primary generalized (osteo)arthritis: Secondary | ICD-10-CM

## 2021-11-06 DIAGNOSIS — W19XXXD Unspecified fall, subsequent encounter: Secondary | ICD-10-CM

## 2021-11-06 DIAGNOSIS — R609 Edema, unspecified: Secondary | ICD-10-CM

## 2021-11-06 DIAGNOSIS — B351 Tinea unguium: Secondary | ICD-10-CM

## 2021-11-06 DIAGNOSIS — M159 Polyosteoarthritis, unspecified: Secondary | ICD-10-CM

## 2021-11-06 DIAGNOSIS — E113293 Type 2 diabetes mellitus with mild nonproliferative diabetic retinopathy without macular edema, bilateral: Secondary | ICD-10-CM | POA: Diagnosis not present

## 2021-11-06 DIAGNOSIS — G8929 Other chronic pain: Secondary | ICD-10-CM

## 2021-11-06 DIAGNOSIS — E785 Hyperlipidemia, unspecified: Secondary | ICD-10-CM

## 2021-11-06 MED ORDER — METOPROLOL SUCCINATE ER 25 MG PO TB24
ORAL_TABLET | Freq: Every day | ORAL | 2 refills | Status: DC
Start: 2021-11-06 — End: 2022-01-09
  Filled 2021-11-06 – 2021-11-20 (×2): qty 90, 90d supply, fill #0

## 2021-11-06 MED ORDER — EMPAGLIFLOZIN 25 MG PO TABS
ORAL_TABLET | Freq: Every day | ORAL | 3 refills | Status: DC
  Filled 2021-11-06: qty 60, fill #0

## 2021-11-06 MED ORDER — FUROSEMIDE 20 MG PO TABS
20.0000 mg | ORAL_TABLET | Freq: Every day | ORAL | 0 refills | Status: DC | PRN
  Filled 2021-11-06 – 2021-11-20 (×2): qty 30, 30d supply, fill #0

## 2021-11-06 MED ORDER — LISINOPRIL 5 MG PO TABS
ORAL_TABLET | Freq: Every day | ORAL | 2 refills | Status: DC
  Filled 2021-11-06: qty 90, fill #0

## 2021-11-06 MED ORDER — ATORVASTATIN CALCIUM 40 MG PO TABS
40.0000 mg | ORAL_TABLET | Freq: Every day | ORAL | 3 refills | Status: DC
  Filled 2021-11-06: qty 90, 90d supply, fill #0

## 2021-11-06 MED ORDER — GABAPENTIN 300 MG PO CAPS
ORAL_CAPSULE | ORAL | 2 refills | Status: DC
  Filled 2021-11-06: qty 90, fill #0
  Filled 2021-12-04: qty 90, 30d supply, fill #0
  Filled 2022-01-09: qty 90, 30d supply, fill #1

## 2021-11-06 MED ORDER — CITALOPRAM HYDROBROMIDE 20 MG PO TABS
ORAL_TABLET | Freq: Every day | ORAL | 3 refills | Status: DC
Start: 2021-11-06 — End: 2022-01-09
  Filled 2021-11-06: qty 30, fill #0
  Filled 2022-01-09: qty 30, 30d supply, fill #0

## 2021-11-06 NOTE — Assessment & Plan Note (Signed)
Frequent falls with gait disturbance now using walker ?

## 2021-11-06 NOTE — Assessment & Plan Note (Signed)
A1c 6.7 continue Jardiance creatinine was stable at the last visit ?

## 2021-11-06 NOTE — Patient Instructions (Addendum)
Prevnar vaccine was given ? ?Refills on medications issued ? ?Go to xray on first floor for wrist and right hip xray ? ?Dr Joya Gaskins will bring you new pair of shoes Thursday ? ?A referral to foot doctor was made ? ?Return Dr Joya Gaskins 2 months ? ? ?

## 2021-11-06 NOTE — Assessment & Plan Note (Signed)
Onychomycosis all toenails will refer to podiatry ?

## 2021-11-06 NOTE — Assessment & Plan Note (Signed)
Primarily involving right hip at this time also right wrist we will obtain x-rays of both areas likely refer back to orthopedics ?

## 2021-11-06 NOTE — Assessment & Plan Note (Signed)
Reimage right hip likely referral back to orthopedics ?

## 2021-11-06 NOTE — Assessment & Plan Note (Signed)
Stable on citalopram and counseling ?

## 2021-11-06 NOTE — Assessment & Plan Note (Signed)
Advised patient she needs a repeat eye exam ?

## 2021-11-06 NOTE — Assessment & Plan Note (Signed)
-  Continue statins ?

## 2021-11-07 ENCOUNTER — Other Ambulatory Visit: Payer: Self-pay

## 2021-11-08 NOTE — BH Specialist Note (Signed)
Integrated Behavioral Health Follow Up In-Person Visit ? ?MRN: [id number] ?Name: Donna Nguyen ? ?Number of [place] Clinician visits: Additional Visit ? ?Session Start time: 9485 ?  ?Session End time: 4627 ? ?Total time in minutes: 30 ? ? ?Types of Service: Individual psychotherapy ? ?Interpretor:No. Interpretor Name and Language: N/a ? ?Subjective: ?Donna Nguyen is a 81 y.o. female accompanied by  self ?Patient was referred by Asencion Noble, MD for depression. ?Patient reports the following symptoms/concerns: Reports feeling depressed and anxious. Reports that she is worried about her health. Reports that she experiences excess pain and has difficulty accepting that she can't move around the same anymore. Reports that she is still considering home health and will begin applying for medicaid in case she decides to get it. Reports that she also continues to miss her home that she owned. Reports that she feels like it was stolen from her.  ?Duration of problem: 5 years; Severity of problem: severe ? ?Objective: ?Mood: Anxious and Affect: Appropriate ?Risk of harm to self or others: No plan to harm self or others ? ?Life Context: ?Family and Social: Pt has limited support system as she does not speak to family. 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 ?Self-Care: Denies substance use. Reports limited coping skills. ?Life Changes: Pt was kicked out of her home several years ago and experienced homelessness as a result. Pt currently stays in an apartment. Pt is experiencing physical health changes. ?(No changes to life context) ? ?Patient and/or Family's Strengths/Protective Factors: ?Health support and resillience ? ?Goals Addressed: ?Patient will: ? Reduce symptoms of: anxiety and depression  ? Increase knowledge and/or ability of: coping skills and healthy habits  ? Demonstrate ability to: Increase healthy adjustment to current life circumstances and Increase adequate support systems for  patient/family ? ?Progress towards Goals: ?Ongoing ? ?Interventions: ?Interventions utilized:  CBT Cognitive Behavioral Therapy and Supportive Counseling ?Standardized Assessments completed: Not Needed ? ?Patient and/or Family Response: Pt is beginning to consider home health. Pt receptive to cognitive restructuring.  ? ?Patient Centered Plan: ?Patient is on the following Treatment Plan(s): Depression and anxiety ? ?Assessment: ?Denies S/HI. Patient currently experiencing depression and difficulty accepting her physical health. Pt processing skills are improving about her limited mobility. Pt appears to have an obsession with her previous home that she owned and has not accepted that she no longer has it.  ? ?Patient may benefit from continuing to process her physical health. LCSW utilized cognitive restructuring and assisted with processing her physical health changes.  ? ?Plan: ?Follow up with behavioral health clinician on : 12/06/21 ?Behavioral recommendations: Utilize deep breathing ?Referral(s): [place] (In Clinic) ?"From scale of 1-10, how likely are you to follow plan?": 10 ? ?Tiesha Marich C Naphtali Zywicki, LCSW ? ? ?

## 2021-11-09 ENCOUNTER — Telehealth: Payer: Self-pay | Admitting: Critical Care Medicine

## 2021-11-09 DIAGNOSIS — R413 Other amnesia: Secondary | ICD-10-CM

## 2021-11-09 DIAGNOSIS — R269 Unspecified abnormalities of gait and mobility: Secondary | ICD-10-CM

## 2021-11-09 DIAGNOSIS — Z9989 Dependence on other enabling machines and devices: Secondary | ICD-10-CM

## 2021-11-09 DIAGNOSIS — W19XXXD Unspecified fall, subsequent encounter: Secondary | ICD-10-CM

## 2021-11-09 DIAGNOSIS — M159 Polyosteoarthritis, unspecified: Secondary | ICD-10-CM

## 2021-11-09 DIAGNOSIS — F423 Hoarding disorder: Secondary | ICD-10-CM

## 2021-11-09 DIAGNOSIS — G8929 Other chronic pain: Secondary | ICD-10-CM

## 2021-11-09 DIAGNOSIS — R42 Dizziness and giddiness: Secondary | ICD-10-CM

## 2021-11-09 DIAGNOSIS — N3941 Urge incontinence: Secondary | ICD-10-CM

## 2021-11-09 DIAGNOSIS — M797 Fibromyalgia: Secondary | ICD-10-CM

## 2021-11-09 NOTE — Telephone Encounter (Signed)
I rounded on the patient today in her home,   I brought her some shoes and clothes and personal care skin cream that was graciously provided by Freddy Finner from [employer] congreg[employer] ? ?The two room apt was extremely dirty, crowded with a lot of furniture and boxes in sitting room  She states she does not sit in that room just uses it to "store" ? ?The kitchenette had a horrific odor  the refrig was full of spoiled single use meals that she had hoarded from before ? ?She said her microwave was not set up.  It was turned to the side but operable   I set it up for her and it works and I showed her how to work the [employer] ? ?There was a dirty cat litter box in the bathroom and it was cluttered   there must have been 5 walkers folded up scattered in the home  she is using her rollator ? ?I would like to get her PCS and life alert   also needs a regular shower chair  the bathtub is quite narrow ? ?She liked the new clothes very much and the shoes fit well. ?She plans to get her xrays of hip and wrist soon. ? ?She is very high risk for a severe fall with injury ?

## 2021-11-09 NOTE — Telephone Encounter (Signed)
Order for shower chair faxed to [facility] ?

## 2021-11-14 ENCOUNTER — Other Ambulatory Visit: Payer: Self-pay

## 2021-11-16 NOTE — Telephone Encounter (Signed)
She does not have Medicaid for [facility] referral ?

## 2021-11-20 ENCOUNTER — Other Ambulatory Visit: Payer: Self-pay | Admitting: Critical Care Medicine

## 2021-11-20 ENCOUNTER — Other Ambulatory Visit: Payer: Self-pay

## 2021-11-21 NOTE — Telephone Encounter (Signed)
Requested medications are due for refill today.  A bit too soon ? ?Requested medications are on the active medications list.  yes ? ?Last refill. 11/02/2021 #90 0 refills ? ?Future visit scheduled.   Yes ? ?Notes to clinic.  Medication refill is not delegated. ? ? ? ?Requested Prescriptions  ?Pending Prescriptions Disp Refills  ? traMADol (ULTRAM) 50 MG tablet 90 tablet 0  ?  Sig: TAKE 1 TABLET (50 MG TOTAL) BY MOUTH EVERY 8 (EIGHT) HOURS AS NEEDED FOR SEVERE PAIN.  ?  ? Not Delegated - Analgesics:  Opioid Agonists Failed - 11/20/2021  5:09 PM  ?  ?  Failed - This refill cannot be delegated  ?  ?  Failed - Urine Drug Screen completed in last 360 days  ?  ?  Passed - Valid encounter within last 3 months  ?  Recent Outpatient Visits   ? ?      ? 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 [place] Elsie Stain, MD  ? 1 month ago Fall, subsequent encounter  ? [place] [facility], [place], [place]  ? 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 [place] Elsie Stain, MD  ? 5 months ago Fall, initial encounter  ? [place], [place]  ? 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 [place] Elsie Stain, MD  ? ?  ?  ?Future Appointments   ? ?        ? In 1 month Joya Gaskins Burnett Harry, MD [place]  ? ?  ? ?  ?  ?  ?  ?

## 2021-11-24 ENCOUNTER — Other Ambulatory Visit: Payer: Self-pay

## 2021-11-24 MED ORDER — TRAMADOL HCL 50 MG PO TABS
ORAL_TABLET | ORAL | 0 refills | Status: DC
  Filled 2021-11-24 – 2021-12-01 (×2): qty 90, 30d supply, fill #0

## 2021-12-01 ENCOUNTER — Other Ambulatory Visit: Payer: Self-pay

## 2021-12-04 ENCOUNTER — Ambulatory Visit
Admission: RE | Admit: 2021-12-04 | Discharge: 2021-12-04 | Disposition: A | Discharge status: Home / Self Care | Payer: Medicare Other | Source: Ambulatory Visit | Attending: Critical Care Medicine | Admitting: Critical Care Medicine

## 2021-12-04 ENCOUNTER — Other Ambulatory Visit: Payer: Self-pay

## 2021-12-04 DIAGNOSIS — M25531 Pain in right wrist: Secondary | ICD-10-CM

## 2021-12-04 DIAGNOSIS — M25551 Pain in right hip: Secondary | ICD-10-CM | POA: Diagnosis not present

## 2021-12-04 DIAGNOSIS — G8929 Other chronic pain: Secondary | ICD-10-CM

## 2021-12-06 ENCOUNTER — Ambulatory Visit: Payer: Medicare Other | Attending: Critical Care Medicine | Admitting: Clinical

## 2021-12-06 ENCOUNTER — Telehealth: Payer: Self-pay | Admitting: Critical Care Medicine

## 2021-12-06 DIAGNOSIS — F333 Major depressive disorder, recurrent, severe with psychotic symptoms: Secondary | ICD-10-CM

## 2021-12-06 NOTE — Telephone Encounter (Signed)
Pt wanted to inform Dr.Wright and his nurse that her phone is not working at the moment, just in case someone tried calling about her X-Ray results. When she gets it working she will call with updated phone number. ?

## 2021-12-07 NOTE — Telephone Encounter (Signed)
Fyi pt phone out of service ?

## 2021-12-07 NOTE — Telephone Encounter (Signed)
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

## 2021-12-08 NOTE — Telephone Encounter (Signed)
Noted  

## 2021-12-13 NOTE — BH Specialist Note (Signed)
Integrated Behavioral Health Follow Up In-Person Visit ? ?MRN: [id number] ?Name: Donna Nguyen ? ?Number of [place] Clinician visits: Additional Visit ? ?Session Start time: 1520 ?  ?Session End time: 1620 ? ?Total time in minutes: 60 ? ? ?Types of Service: Individual psychotherapy ? ?Interpretor:No. Interpretor Name and Language: N/A ? ?Subjective: ?Donna Nguyen is a 81 y.o. female accompanied by  self ?Patient was referred by Asencion Noble, MD for depression. ?Patient reports the following symptoms/concerns: Reports sometimes feeling down. Reports that she is beginning to accept that she needs assistance at home. Reports that she still needs to apply for a medicaid. Reports that she knows apart of the reason she lost her home is because she was behind on her payments.  ?Duration of problem: 5 years ; Severity of problem: severe ? ?Objective: ?Mood: Anxious and Affect: Appropriate ?Risk of harm to self or others: No plan to harm self or others ? ?Life Context: ?Family and Social: Pt has limited support system as she does not speak to family. 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 ?Self-Care: Denies substance use. Reports limited coping skills. ?Life Changes: Pt was kicked out of her home several years ago and experienced homelessness as a result. Pt currently stays in an apartment. Pt is experiencing physical health changes. ?(No changes to life context) ? ?Patient and/or Family's Strengths/Protective Factors: ?Health support and resillience ? ?Goals Addressed: ?Patient will: ? Reduce symptoms of: anxiety and depression  ? Increase knowledge and/or ability of: coping skills and healthy habits  ? Demonstrate ability to: Increase healthy adjustment to current life circumstances and Increase adequate support systems for patient/family ? ?Progress towards Goals: ?Ongoing ? ?Interventions: ?Interventions utilized:  CBT Cognitive Behavioral Therapy and Supportive Counseling ?Standardized  Assessments completed: Not Needed ? ?Patient and/or Family Response: Pt receptive to tx. Pt receptive to affirmation provided on pt's progress.  ? ?Patient Centered Plan: ?Patient is on the following Treatment Plan(s): Depression and anxiety ? ?Assessment: ?Denies SI/HI. Patient currently experiencing depression. Pt does worry about her health and she is beginning to accept that he needs assistance. Pt also is accepting part of her past about er home.  ? ?Patient may benefit from outpatient therapy. LCSW provided affirmation for pt's progress. LCSW utilized Adult nurse. LCSW will refer pt to legal aid to assist with medicaid application. LCSW discussed her departure from [facility] with pt. LCSW discussed outpatient therapy options with pt. Pt requests for her PCP to refer her to therapy. ? ?Plan: ?Follow up with behavioral health clinician on : Attend outpatient therapy after referred ?Behavioral recommendations: Utilize deep breathing.  ?Referral(s): [employer] Resources:  Air cabin crew and Counselor ?"From scale of 1-10, how likely are you to follow plan?": 10 ? ?Donna Nguyen C Donna Erney, LCSW ? ? ?

## 2021-12-15 NOTE — Telephone Encounter (Signed)
Pt was referred to legal aid to assist with medicaid application in order for pt to qualify for pcs.

## 2022-01-08 ENCOUNTER — Telehealth: Payer: Self-pay

## 2022-01-08 NOTE — Telephone Encounter (Signed)
Call placed to [facility] to check on status of shower stool order.  I spoke to [place] who said that they could not reach the patient, phone possibly disconnected - 11/10/2021 . They spoke to her brother who said that he would have her call them about the order. He did not have another phone number for the patient

## 2022-01-08 NOTE — Progress Notes (Unsigned)
  Established Patient Office Visit  Subjective:  Patient ID: Donna Nguyen, female    DOB: 01/16/1941  Age: 81 y.o. MRN: [id number]  CC:  No chief complaint on file.   HPI 08/07/21  Donna Nguyen presents for primary care follow-up.  Note she now lives in [place] in an apartment.  Unfortunately she had 2 hard falls in the last 3 weeks.  The Rollator that she has does not fit in her car.  She has a folding walker which unfortunately has lost the screws she put some webbing at the bottom of the walker to be able to hold her groceries and the walker came apart.  She fell once in her apartment and twice walking down the hall to get to her front door.  Patient is quite weak and has refused physical therapy.  She does have right-sided hip chronic pain with bone-on-bone.  She does need a hip replacement wishes to hold off.  She is unvaccinated for COVID.  Concerned about coming to the hospital during the COVID pandemic.  Patient maintains her diabetic medications needs complete set of labs and refuses to have fingerstick here in the office for her glucose and A1c  No other complaints at this time. Patient has been seen by clinical social worker and has been very appreciative of this care and has been helpful to her she also maintains Celexa daily  11/06/21 Patient seen in return follow-up and has had 5 falls since January in her home.  She has had chronic right hip pain that is worsened since the falls also her right wrist has been hurting as well.  The shoes she is wearing are worn out and make it difficult to ambulate.  She is not able to get into a store to purchase more shoes.  The last visit her A1c was 6.7.  She is only eating 1 healthy meal a day.  She has no other real complaints.  She does need an eye exam she states she will receive that in this calendar year.  She agrees to receive the pneumonia vaccine at this visit. Patient has significant toenail fungus and would benefit from  podiatry referral she agrees to receive this.  01/09/2022 Patient returns today with a list of complaints.  She is having persistent pain in the right and left hip the right is worse than the left.  She has been seen previously by orthopedics of being evaluated for hip replacement.  Repeat x-ray shows progression of disease in the right hip more so than the left.  Another issue is right hand pain x-ray was finally done and showed arthritis in the right thumb and right index finger.  Another issue is that she itches all over and she was using a different soap when she broke out with the pruritus.  Another issue is that she needs eye follow-up for cataracts another issue is that she has some bruising no other issues she has had dark urine for 6 weeks another issue is that of diabetes on arrival blood sugar was 115  Past Medical History:  Diagnosis Date   Diabetes mellitus without complication (HCC)    Erythema migrans (Lyme disease) 11/05/2017   Homelessness 04/18/2020   Hypertension    MI (mitral incompetence)    Stroke (HCC)     Past Surgical History:  Procedure Laterality Date   bladdee     BLADDER SURGERY     MOLE REMOVAL      History reviewed.   No pertinent family history.  Social History   Socioeconomic History   Marital status: Divorced    Spouse name: Not on file   Number of children: Not on file   Years of education: Not on file   Highest education level: Not on file  Occupational History   Not on file  Tobacco Use   Smoking status: Never   Smokeless tobacco: Never  Substance and Sexual Activity   Alcohol use: Yes    Alcohol/week: 4.0 - 5.0 standard drinks    Types: 1 - 2 Glasses of wine, 2 Cans of beer, 1 Shots of liquor per week    Comment: nightly   Drug use: No   Sexual activity: Yes    Birth control/protection: Post-menopausal  Other Topics Concern   Not on file  Social History Narrative   Not on file   Social Determinants of Health   Financial Resource  Strain: Not on file  Food Insecurity: Not on file  Transportation Needs: Not on file  Physical Activity: Not on file  Stress: Not on file  Social Connections: Not on file  Intimate Partner Violence: Not on file    Outpatient Medications Prior to Visit  Medication Sig Dispense Refill   Accu-Chek Softclix Lancets lancets Use as instructed 100 each 12   Blood Glucose Monitoring Suppl (ACCU-CHEK GUIDE) w/Device KIT Use to check blood sugar 1 kit 0   furosemide (LASIX) 20 MG tablet Take 1 tablet (20 mg total) by mouth daily as needed for edema. For swelling 30 tablet 0   glucose blood (ACCU-CHEK GUIDE) test strip Use as instructed 100 each 12   atorvastatin (LIPITOR) 40 MG tablet Take 1 tablet (40 mg total) by mouth daily. 90 tablet 3   Cholecalciferol (VITAMIN D) 125 MCG (5000 UT) CAPS Take 1 capsule by mouth daily. 60 capsule 1   citalopram (CELEXA) 20 MG tablet TAKE 1 TABLET (20 MG TOTAL) BY MOUTH DAILY. 30 tablet 3   empagliflozin (JARDIANCE) 25 MG TABS tablet TAKE 1 TABLET (25 MG TOTAL) BY MOUTH DAILY. 60 tablet 3   gabapentin (NEURONTIN) 300 MG capsule TAKE 1 CAPSULE BY MOUTH 3 TIMES DAILY. MAY TAKE AN EXTRA DOSE ONCE A DAY AS NEEDED. 90 capsule 2   lisinopril (ZESTRIL) 5 MG tablet TAKE 1 TABLET (5 MG TOTAL) BY MOUTH DAILY. 90 tablet 2   metoprolol succinate (TOPROL-XL) 25 MG 24 hr tablet TAKE 1 TABLET (25 MG TOTAL) BY MOUTH DAILY. 90 tablet 2   traMADol (ULTRAM) 50 MG tablet TAKE 1 TABLET (50 MG TOTAL) BY MOUTH EVERY 8 (EIGHT) HOURS AS NEEDED FOR SEVERE PAIN. 90 tablet 0   No facility-administered medications prior to visit.    Allergies  Allergen Reactions   Acetaminophen Shortness Of Breath   Influenza Virus Vaccine Split [Influenza Vac Typ A&B Surf Ant] Swelling   Amitriptyline    Ciprofloxacin    Epinephrine    Penicillins    Metformin And Related Palpitations    ROS Review of Systems  Constitutional:  Negative for chills, diaphoresis and fever.  HENT:  Negative for  congestion, hearing loss, nosebleeds, sore throat and tinnitus.   Eyes:  Negative for photophobia and redness.  Respiratory:  Negative for cough, shortness of breath, wheezing and stridor.   Cardiovascular:  Negative for chest pain, palpitations and leg swelling.  Gastrointestinal:  Negative for abdominal pain, blood in stool, constipation, diarrhea, nausea and vomiting.  Endocrine: Negative for polydipsia.  Genitourinary:  Negative for dysuria, flank pain,   frequency, hematuria and urgency.  Musculoskeletal:  Positive for gait problem. Negative for back pain, myalgias and neck pain.       Hip pain   Skin:  Positive for rash.  Allergic/Immunologic: Negative for environmental allergies.  Neurological:  Negative for dizziness, tremors, seizures, weakness and headaches.  Hematological:  Does not bruise/bleed easily.  Psychiatric/Behavioral:  Negative for dysphoric mood, self-injury, sleep disturbance and suicidal ideas. The patient is not nervous/anxious.      Objective:    Physical Exam Vitals reviewed.  Constitutional:      Appearance: Normal appearance. She is well-developed. She is obese. She is not diaphoretic.  HENT:     Head: Normocephalic and atraumatic.     Nose: Nose normal. No nasal deformity, septal deviation, mucosal edema or rhinorrhea.     Right Sinus: No maxillary sinus tenderness or frontal sinus tenderness.     Left Sinus: No maxillary sinus tenderness or frontal sinus tenderness.     Mouth/Throat:     Mouth: Mucous membranes are moist.     Pharynx: Oropharynx is clear. No oropharyngeal exudate.  Eyes:     General: No scleral icterus.    Conjunctiva/sclera: Conjunctivae normal.     Pupils: Pupils are equal, round, and reactive to light.  Neck:     Thyroid: No thyromegaly.     Vascular: No carotid bruit or JVD.     Trachea: Trachea normal. No tracheal tenderness or tracheal deviation.  Cardiovascular:     Rate and Rhythm: Normal rate and regular rhythm.     Chest  Wall: PMI is not displaced.     Pulses: Normal pulses. No decreased pulses.     Heart sounds: Normal heart sounds, S1 normal and S2 normal. Heart sounds not distant. No murmur heard. No systolic murmur is present.  No diastolic murmur is present.    No friction rub. No gallop. No S3 or S4 sounds.  Pulmonary:     Effort: Pulmonary effort is normal. No tachypnea, accessory muscle usage or respiratory distress.     Breath sounds: Normal breath sounds. No stridor. No decreased breath sounds, wheezing, rhonchi or rales.  Chest:     Chest wall: No tenderness.  Abdominal:     General: Bowel sounds are normal. There is no distension.     Palpations: Abdomen is soft. Abdomen is not rigid.     Tenderness: There is no abdominal tenderness. There is no guarding or rebound.  Musculoskeletal:        General: Deformity present. Normal range of motion.     Cervical back: Normal range of motion and neck supple. No edema, erythema or rigidity. No muscular tenderness. Normal range of motion.     Comments: Severe onychomycosis all toenails  Right wrist deformity decreased range of motion  Decreased range of motion right leg at the hip  Lymphadenopathy:     Head:     Right side of head: No submental or submandibular adenopathy.     Left side of head: No submental or submandibular adenopathy.     Cervical: No cervical adenopathy.  Skin:    General: Skin is warm and dry.     Coloration: Skin is not pale.     Findings: Rash present.     Nails: There is no clubbing.     Comments: Pruritic rash over the anterior chest both arms back of the neck  Neurological:     General: No focal deficit present.     Mental Status: She  is alert and oriented to person, place, and time. Mental status is at baseline.     Sensory: No sensory deficit.  Psychiatric:        Mood and Affect: Mood normal.        Speech: Speech normal.        Behavior: Behavior normal.        Thought Content: Thought content normal.    BP  124/68   Pulse 69   Wt 197 lb 6.4 oz (89.5 kg)   SpO2 97%   BMI 32.85 kg/m  Wt Readings from Last 3 Encounters:  01/09/22 197 lb 6.4 oz (89.5 kg)  11/06/21 206 lb 3.2 oz (93.5 kg)  09/27/21 204 lb (92.5 kg)     Health Maintenance Due  Topic Date Due   OPHTHALMOLOGY EXAM  06/30/2021    There are no preventive care reminders to display for this patient.  Lab Results  Component Value Date   TSH 3.050 04/18/2020   Lab Results  Component Value Date   WBC 14.4 (H) 08/07/2021   HGB 14.6 08/07/2021   HCT 43.5 08/07/2021   MCV 90 08/07/2021   PLT 271 08/07/2021   Lab Results  Component Value Date   NA 138 08/07/2021   K 4.4 08/07/2021   CO2 22 08/07/2021   GLUCOSE 132 (H) 08/07/2021   BUN 26 08/07/2021   CREATININE 1.40 (H) 08/07/2021   BILITOT 0.4 08/07/2021   ALKPHOS 122 (H) 08/07/2021   AST 19 08/07/2021   ALT 26 08/07/2021   PROT 6.6 08/07/2021   ALBUMIN 4.5 08/07/2021   CALCIUM 9.5 08/07/2021   EGFR 39 (L) 08/07/2021   Lab Results  Component Value Date   CHOL 197 08/07/2021   Lab Results  Component Value Date   HDL 41 08/07/2021   Lab Results  Component Value Date   LDLCALC 107 (H) 08/07/2021   Lab Results  Component Value Date   TRIG 287 (H) 08/07/2021   Lab Results  Component Value Date   CHOLHDL 4.8 (H) 08/07/2021   Lab Results  Component Value Date   HGBA1C 6.7 (H) 08/07/2021      Assessment & Plan:   Problem List Items Addressed This Visit       Cardiovascular and Mediastinum   Essential hypertension    Blood pressure at this point well controlled no changes made       Relevant Medications   metoprolol succinate (TOPROL-XL) 25 MG 24 hr tablet   lisinopril (ZESTRIL) 5 MG tablet   atorvastatin (LIPITOR) 40 MG tablet   Other Relevant Orders   Comprehensive metabolic panel   CBC with Differential/Platelet     Endocrine   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)    Continue gabapentin       Relevant  Medications   lisinopril (ZESTRIL) 5 MG tablet   empagliflozin (JARDIANCE) 25 MG TABS tablet   atorvastatin (LIPITOR) 40 MG tablet   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) - Primary    Follow-up A1c continue Jardiance metformin       Relevant Medications   lisinopril (ZESTRIL) 5 MG tablet   empagliflozin (JARDIANCE) 25 MG TABS tablet   atorvastatin (LIPITOR) 40 MG tablet   Other Relevant Orders   Hemoglobin A1c   Hyperlipidemia associated with type 2 diabetes mellitus (HCC)    Continue statin       Relevant Medications   metoprolol succinate (TOPROL-XL) 25 MG 24 hr   tablet   lisinopril (ZESTRIL) 5 MG tablet   empagliflozin (JARDIANCE) 25 MG TABS tablet   atorvastatin (LIPITOR) 40 MG tablet   Other Relevant Orders   Lipid panel   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)    Needs follow-up eye exam       Relevant Medications   lisinopril (ZESTRIL) 5 MG tablet   empagliflozin (JARDIANCE) 25 MG TABS tablet   atorvastatin (LIPITOR) 40 MG tablet     Musculoskeletal and Integument   Hip arthritis    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 referral to Ortho care for potential hip replacement       Relevant Medications   traMADol (ULTRAM) 50 MG tablet   predniSONE (DELTASONE) 10 MG tablet   Other Relevant Orders   Ambulatory referral to Orthopedic Surgery   RESOLVED: Dermatitis venenata     Other   Urge incontinence    Check urinalysis       Other Visit Diagnoses     Diabetic peripheral neuropathy (HCC)       Relevant Medications   lisinopril (ZESTRIL) 5 MG tablet   gabapentin (NEURONTIN) 300 MG capsule   empagliflozin (JARDIANCE) 25 MG TABS tablet   citalopram (CELEXA) 20 MG tablet   atorvastatin (LIPITOR) 40 MG tablet   hydrOXYzine (VISTARIL) 25 MG capsule      Meds ordered this encounter  Medications   traMADol (ULTRAM) 50 MG tablet    Sig: TAKE 1 TABLET (50 MG TOTAL) BY MOUTH EVERY 8  (EIGHT) HOURS AS NEEDED FOR SEVERE PAIN.    Dispense:  90 tablet    Refill:  0   metoprolol succinate (TOPROL-XL) 25 MG 24 hr tablet    Sig: TAKE 1 TABLET (25 MG TOTAL) BY MOUTH DAILY.    Dispense:  90 tablet    Refill:  2   lisinopril (ZESTRIL) 5 MG tablet    Sig: TAKE 1 TABLET (5 MG TOTAL) BY MOUTH DAILY.    Dispense:  90 tablet    Refill:  2   gabapentin (NEURONTIN) 300 MG capsule    Sig: TAKE 1 CAPSULE BY MOUTH 3 TIMES DAILY. MAY TAKE AN EXTRA DOSE ONCE A DAY AS NEEDED.    Dispense:  90 capsule    Refill:  2   empagliflozin (JARDIANCE) 25 MG TABS tablet    Sig: TAKE 1 TABLET (25 MG TOTAL) BY MOUTH DAILY.    Dispense:  60 tablet    Refill:  3   citalopram (CELEXA) 20 MG tablet    Sig: TAKE 1 TABLET (20 MG TOTAL) BY MOUTH DAILY.    Dispense:  30 tablet    Refill:  3   Cholecalciferol (VITAMIN D) 125 MCG (5000 UT) CAPS    Sig: Take 1 capsule by mouth daily.    Dispense:  60 capsule    Refill:  1   atorvastatin (LIPITOR) 40 MG tablet    Sig: Take 1 tablet (40 mg total) by mouth daily.    Dispense:  90 tablet    Refill:  3    On next refill   predniSONE (DELTASONE) 10 MG tablet    Sig: Take 4 tablets daily for 5 days then stop    Dispense:  20 tablet    Refill:  0   hydrOXYzine (VISTARIL) 25 MG capsule    Sig: Take 1 capsule (25 mg total) by mouth every 8 (eight) hours as needed.    Dispense:  30 capsule  Refill:  0   diclofenac Sodium (VOLTAREN) 1 % GEL    Sig: Apply 2 g topically 4 (four) times daily. To right thumb and index finger    Dispense:  100 g    Refill:  2  37 minutes spent high degree of complexity For allergic contact dermatitis will give pulse prednisone and Atarax and give topical Voltaren gel for arthritis of the right hand and thumb Follow-up: Return in about 4 months (around 05/12/2022).     , MD 

## 2022-01-09 ENCOUNTER — Other Ambulatory Visit: Payer: Self-pay

## 2022-01-09 ENCOUNTER — Other Ambulatory Visit: Payer: Self-pay | Admitting: Family Medicine

## 2022-01-09 ENCOUNTER — Ambulatory Visit: Payer: Medicare Other | Attending: Critical Care Medicine | Admitting: Critical Care Medicine

## 2022-01-09 ENCOUNTER — Encounter: Payer: Self-pay | Admitting: Critical Care Medicine

## 2022-01-09 VITALS — BP 124/68 | HR 69 | Wt 197.4 lb

## 2022-01-09 DIAGNOSIS — E785 Hyperlipidemia, unspecified: Secondary | ICD-10-CM

## 2022-01-09 DIAGNOSIS — N182 Chronic kidney disease, stage 2 (mild): Secondary | ICD-10-CM | POA: Diagnosis not present

## 2022-01-09 DIAGNOSIS — L235 Allergic contact dermatitis due to other chemical products: Secondary | ICD-10-CM | POA: Diagnosis not present

## 2022-01-09 DIAGNOSIS — E1142 Type 2 diabetes mellitus with diabetic polyneuropathy: Secondary | ICD-10-CM

## 2022-01-09 DIAGNOSIS — M1610 Unilateral primary osteoarthritis, unspecified hip: Secondary | ICD-10-CM | POA: Diagnosis not present

## 2022-01-09 DIAGNOSIS — E1149 Type 2 diabetes mellitus with other diabetic neurological complication: Secondary | ICD-10-CM

## 2022-01-09 DIAGNOSIS — R300 Dysuria: Secondary | ICD-10-CM | POA: Diagnosis not present

## 2022-01-09 DIAGNOSIS — E1122 Type 2 diabetes mellitus with diabetic chronic kidney disease: Secondary | ICD-10-CM

## 2022-01-09 DIAGNOSIS — L259 Unspecified contact dermatitis, unspecified cause: Secondary | ICD-10-CM | POA: Insufficient documentation

## 2022-01-09 DIAGNOSIS — E1169 Type 2 diabetes mellitus with other specified complication: Secondary | ICD-10-CM | POA: Diagnosis not present

## 2022-01-09 DIAGNOSIS — I10 Essential (primary) hypertension: Secondary | ICD-10-CM | POA: Diagnosis not present

## 2022-01-09 DIAGNOSIS — N3941 Urge incontinence: Secondary | ICD-10-CM

## 2022-01-09 DIAGNOSIS — E113293 Type 2 diabetes mellitus with mild nonproliferative diabetic retinopathy without macular edema, bilateral: Secondary | ICD-10-CM

## 2022-01-09 MED ORDER — LISINOPRIL 5 MG PO TABS
ORAL_TABLET | Freq: Every day | ORAL | 2 refills | Status: AC
  Filled 2022-01-09: qty 90, 90d supply, fill #0

## 2022-01-09 MED ORDER — PREDNISONE 10 MG PO TABS
ORAL_TABLET | ORAL | 0 refills | Status: AC
  Filled 2022-01-09: qty 20, 5d supply, fill #0

## 2022-01-09 MED ORDER — TRAMADOL HCL 50 MG PO TABS
ORAL_TABLET | ORAL | 0 refills | Status: DC
  Filled 2022-01-09: qty 90, 30d supply, fill #0

## 2022-01-09 MED ORDER — EMPAGLIFLOZIN 25 MG PO TABS
ORAL_TABLET | Freq: Every day | ORAL | 3 refills | Status: AC
Start: 2022-01-09 — End: 2023-01-09
  Filled 2022-01-09: qty 60, 60d supply, fill #0
  Filled 2022-03-16: qty 60, 60d supply, fill #1

## 2022-01-09 MED ORDER — GABAPENTIN 300 MG PO CAPS
ORAL_CAPSULE | ORAL | 2 refills | Status: AC
  Filled 2022-02-13: qty 90, 22d supply, fill #0
  Filled 2022-03-16: qty 90, 22d supply, fill #1
  Filled 2022-04-20: qty 90, 22d supply, fill #2

## 2022-01-09 MED ORDER — METOPROLOL SUCCINATE ER 25 MG PO TB24
ORAL_TABLET | Freq: Every day | ORAL | 2 refills | Status: AC
  Filled 2022-01-09: qty 90, fill #0
  Filled 2022-03-16: qty 90, 90d supply, fill #0

## 2022-01-09 MED ORDER — ATORVASTATIN CALCIUM 40 MG PO TABS
40.0000 mg | ORAL_TABLET | Freq: Every day | ORAL | 3 refills | Status: AC
Start: 2022-01-09 — End: 2023-01-09
  Filled 2022-01-09: qty 90, 90d supply, fill #0

## 2022-01-09 MED ORDER — HYDROXYZINE PAMOATE 25 MG PO CAPS
25.0000 mg | ORAL_CAPSULE | Freq: Three times a day (TID) | ORAL | 0 refills | Status: AC | PRN
  Filled 2022-01-09: qty 30, 10d supply, fill #0

## 2022-01-09 MED ORDER — VITAMIN D 125 MCG (5000 UT) PO CAPS
1.0000 | ORAL_CAPSULE | Freq: Every day | ORAL | 1 refills | Status: AC
  Filled 2022-01-09: qty 60, fill #0

## 2022-01-09 MED ORDER — CITALOPRAM HYDROBROMIDE 20 MG PO TABS
ORAL_TABLET | Freq: Every day | ORAL | 3 refills | Status: AC
Start: 2022-01-09 — End: 2023-01-09
  Filled 2022-03-16: qty 30, 30d supply, fill #0

## 2022-01-09 MED ORDER — DICLOFENAC SODIUM 1 % EX GEL
2.0000 g | Freq: Four times a day (QID) | CUTANEOUS | 2 refills | Status: AC
Start: 2022-01-09 — End: ?
  Filled 2022-01-09: qty 100, 13d supply, fill #0

## 2022-01-09 NOTE — Assessment & Plan Note (Signed)
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

## 2022-01-09 NOTE — Assessment & Plan Note (Signed)
Continue gabapentin.

## 2022-01-09 NOTE — Assessment & Plan Note (Signed)
Check urinalysis. 

## 2022-01-09 NOTE — Assessment & Plan Note (Signed)
Blood pressure at this point well controlled no changes made

## 2022-01-09 NOTE — Patient Instructions (Signed)
Labs will be obtained today  Refills on medications sent to the pharmacy  Take prednisone for a day for 5 days for the rash and also use hydroxyzine 3 times a day as needed for itching  Take Voltaren gel and applied to the right base of the thumb and index finger for pain 4 times daily as needed  Referral back to orthopedics was made for Dr. Jean Rosenthal for hip replacement in the right  Call Dr. Patrici Ranks office for follow-up eye appointment  Return to see Dr. Joya Gaskins 4 months

## 2022-01-09 NOTE — Assessment & Plan Note (Signed)
Follow-up A1c continue Jardiance metformin

## 2022-01-09 NOTE — Assessment & Plan Note (Signed)
Needs follow-up eye exam

## 2022-01-09 NOTE — Assessment & Plan Note (Signed)
Continue statin. 

## 2022-01-10 ENCOUNTER — Telehealth: Payer: Self-pay

## 2022-01-10 LAB — CBC WITH DIFFERENTIAL/PLATELET
Basophils Absolute: 0 10*3/uL (ref 0.0–0.2)
Basos: 0 %
EOS (ABSOLUTE): 0.5 10*3/uL — ABNORMAL HIGH (ref 0.0–0.4)
Eos: 3 %
Hematocrit: 44.1 % (ref 34.0–46.6)
Hemoglobin: 14.5 g/dL (ref 11.1–15.9)
Immature Grans (Abs): 0 10*3/uL (ref 0.0–0.1)
Immature Granulocytes: 0 %
Lymphocytes Absolute: 7.9 10*3/uL — ABNORMAL HIGH (ref 0.7–3.1)
Lymphs: 47 %
MCH: 28.9 pg (ref 26.6–33.0)
MCHC: 32.9 g/dL (ref 31.5–35.7)
MCV: 88 fL (ref 79–97)
Monocytes Absolute: 0.9 10*3/uL (ref 0.1–0.9)
Monocytes: 5 %
Neutrophils Absolute: 7.7 10*3/uL — ABNORMAL HIGH (ref 1.4–7.0)
Neutrophils: 45 %
Platelets: 263 10*3/uL (ref 150–450)
RBC: 5.01 x10E6/uL (ref 3.77–5.28)
RDW: 13.7 % (ref 11.7–15.4)
WBC: 17.2 10*3/uL — ABNORMAL HIGH (ref 3.4–10.8)

## 2022-01-10 LAB — LIPID PANEL
Chol/HDL Ratio: 3.9 ratio (ref 0.0–4.4)
Cholesterol, Total: 166 mg/dL (ref 100–199)
HDL: 43 mg/dL (ref >39)
LDL Chol Calc (NIH): 96 mg/dL (ref 0–99)
Triglycerides: 156 mg/dL — ABNORMAL HIGH (ref 0–149)
VLDL Cholesterol Cal: 27 mg/dL (ref 5–40)

## 2022-01-10 LAB — COMPREHENSIVE METABOLIC PANEL
ALT: 15 IU/L (ref 0–32)
AST: 15 IU/L (ref 0–40)
Albumin/Globulin Ratio: 1.9 (ref 1.2–2.2)
Albumin: 4.6 g/dL (ref 3.7–4.7)
Alkaline Phosphatase: 119 IU/L (ref 44–121)
BUN/Creatinine Ratio: 12 (ref 12–28)
BUN: 21 mg/dL (ref 8–27)
Bilirubin Total: 0.5 mg/dL (ref 0.0–1.2)
CO2: 20 mmol/L (ref 20–29)
Calcium: 9.4 mg/dL (ref 8.7–10.3)
Chloride: 101 mmol/L (ref 96–106)
Creatinine, Ser: 1.76 mg/dL — ABNORMAL HIGH (ref 0.57–1.00)
Globulin, Total: 2.4 g/dL (ref 1.5–4.5)
Glucose: 103 mg/dL — ABNORMAL HIGH (ref 70–99)
Potassium: 4.2 mmol/L (ref 3.5–5.2)
Sodium: 137 mmol/L (ref 134–144)
Total Protein: 7 g/dL (ref 6.0–8.5)
eGFR: 29 mL/min/{1.73_m2} — ABNORMAL LOW (ref >59)

## 2022-01-10 LAB — HEMOGLOBIN A1C
Est. average glucose Bld gHb Est-mCnc: 154 mg/dL
Hgb A1c MFr Bld: 7 % — ABNORMAL HIGH (ref 4.8–5.6)

## 2022-01-10 NOTE — Telephone Encounter (Signed)
Pt was called and vm was left, Information has been sent to nurse pool.   

## 2022-01-10 NOTE — Telephone Encounter (Signed)
-----   Message from Elsie Stain, MD sent at 01/10/2022  8:38 AM EDT ----- Let patient know all labs are normal diabetes under reasonable control White count slightly elevated due to an allergic reaction which I think is from the rash take medications as prescribed

## 2022-01-29 ENCOUNTER — Ambulatory Visit (INDEPENDENT_AMBULATORY_CARE_PROVIDER_SITE_OTHER): Payer: Medicare Other | Admitting: Orthopaedic Surgery

## 2022-01-29 ENCOUNTER — Encounter: Payer: Self-pay | Admitting: Orthopaedic Surgery

## 2022-01-29 VITALS — Ht 65.0 in | Wt 197.4 lb

## 2022-01-29 DIAGNOSIS — M1611 Unilateral primary osteoarthritis, right hip: Secondary | ICD-10-CM | POA: Diagnosis not present

## 2022-01-29 NOTE — Progress Notes (Signed)
The patient is a very pleasant 81 year old female with debilitating well-documented arthritis of her right hip.  This has been well-documented.  She has sought treatment with [place] at the recommendation of her primary care physician and the fact that we had operated on people that she knows and has heard good things about [place].  She has had COVID multiple times.  She is a diabetic and is finally under better diabetic control with a hemoglobin A1c just last month of being 7.0.  She does ambulate with a rolling walker.  X-rays even in 2021 showed severe end-stage arthritis of the right hip with bone-on-bone wear and complete loss of joint space.  Her pain is daily with the right hip and it is detrimentally affecting her mobility, her quality of life and her actives daily living.  She was seen by homeless at 1 point and now does live in a living apartment situation that helps her from healthcare standpoint and living standpoint.  At this point her left hip pain is to the point that she does wish to proceed with a hip replacement.  Her BMI is 32.85.  She currently denies any headache, chest pain, shortness of breath, fever, chills, nausea, vomiting.  There is significant limitations in the range of motion of her right hip.  Her x-rays show severe loss of joint space of the right hip with flattening of the femoral head and sclerotic changes.  This is end-stage arthritis.  We talked in length in detail about hip replacement surgery.  We will work on a Tourist information centre manager standpoint of getting her the needs that can support her after surgery given the fact that she lives in a single apartment that does have stairs and she is by herself with no family support at all.  We described what the surgery involves including a discussion of the risk and benefits of surgery and I gave her handout about hip replacement surgery.  We will be in touch on getting the surgery scheduled.  All question concerns were answered and addressed.

## 2022-02-13 ENCOUNTER — Other Ambulatory Visit: Payer: Self-pay | Admitting: Critical Care Medicine

## 2022-02-13 ENCOUNTER — Other Ambulatory Visit: Payer: Self-pay

## 2022-02-13 MED ORDER — TRAMADOL HCL 50 MG PO TABS
ORAL_TABLET | ORAL | 0 refills | Status: DC
  Filled 2022-02-13: qty 90, 30d supply, fill #0

## 2022-02-13 NOTE — Telephone Encounter (Signed)
Requested medication (s) are due for refill today: yes  Requested medication (s) are on the active medication list: yes  Last refill:  01/09/22 #90/0  Future visit scheduled: yes  Notes to clinic:  Unable to refill per protocol, cannot delegate.     Requested Prescriptions  Pending Prescriptions Disp Refills   traMADol (ULTRAM) 50 MG tablet 90 tablet 0    Sig: TAKE 1 TABLET (50 MG TOTAL) BY MOUTH EVERY 8 (EIGHT) HOURS AS NEEDED FOR SEVERE PAIN.     Not Delegated - Analgesics:  Opioid Agonists Failed - 02/13/2022 11:46 AM      Failed - This refill cannot be delegated      Failed - Urine Drug Screen completed in last 360 days      Passed - Valid encounter within last 3 months    Recent Outpatient Visits           1 month ago Type 2 diabetes mellitus with stage 2 chronic kidney disease, without long-term current use of insulin (HCC)   [facility] Storm Frisk, MD   3 months ago Type 2 diabetes mellitus with neurological manifestations [facility])   [facility] Storm Frisk, MD   4 months ago Fall, subsequent encounter   [facility] [place], [place], [place]   6 months ago Type 2 diabetes mellitus with neurological manifestations [facility])   [facility] Storm Frisk, MD   7 months ago Fall, initial encounter   [facility] [place], [place], [place]       Future Appointments             In 3 months Delford Field, Charlcie Cradle, MD [facility]

## 2022-02-21 ENCOUNTER — Other Ambulatory Visit: Payer: Self-pay

## 2022-02-21 ENCOUNTER — Other Ambulatory Visit: Payer: Self-pay | Admitting: Critical Care Medicine

## 2022-02-21 DIAGNOSIS — R300 Dysuria: Secondary | ICD-10-CM | POA: Diagnosis not present

## 2022-02-21 DIAGNOSIS — R609 Edema, unspecified: Secondary | ICD-10-CM

## 2022-02-21 MED ORDER — CLOBETASOL PROPIONATE 0.05 % EX CREA
1.0000 | TOPICAL_CREAM | Freq: Two times a day (BID) | CUTANEOUS | 0 refills | Status: AC
  Filled 2022-02-21 – 2022-03-16 (×2): qty 30, 30d supply, fill #0

## 2022-02-21 NOTE — Telephone Encounter (Signed)
Temovate cream sent and orders for urine studies sent

## 2022-02-21 NOTE — Telephone Encounter (Signed)
Pt came into the office stating she used a soap that she is allergic to and is having a topical reaction. Pt complaining of redness and itching covering most of her body, worse on arms and right leg. Pt is requesting a cream for the itching.   Pt also states X2 days ago she was having trouble urinating and when she finally was to urinate she felt a burning sensation.

## 2022-02-22 ENCOUNTER — Other Ambulatory Visit: Payer: Self-pay

## 2022-02-22 NOTE — Telephone Encounter (Signed)
She only dropped enough urine to do a culture, just a fyi

## 2022-02-22 NOTE — Telephone Encounter (Signed)
Called pt back and left another vm, letting her know that she has cream for her skin at her pharmacy

## 2022-02-22 NOTE — Telephone Encounter (Signed)
Pt returned call but I could not get pnone on the phone from [facility].

## 2022-02-23 LAB — URINE CULTURE

## 2022-02-23 NOTE — Progress Notes (Signed)
Let pt know urine is clear  no UTI

## 2022-02-26 ENCOUNTER — Telehealth: Payer: Self-pay

## 2022-02-26 ENCOUNTER — Other Ambulatory Visit: Payer: Self-pay

## 2022-02-26 MED ORDER — FUROSEMIDE 20 MG PO TABS
20.0000 mg | ORAL_TABLET | Freq: Every day | ORAL | 0 refills | Status: AC | PRN
  Filled 2022-02-26 – 2022-03-16 (×2): qty 30, 30d supply, fill #0

## 2022-02-26 NOTE — Telephone Encounter (Signed)
Pt was called and vm was left, Information has been sent to nurse pool.   

## 2022-02-26 NOTE — Addendum Note (Signed)
Addended by: Erie Noe on: 02/26/2022 04:32 PM   Modules accepted: Orders

## 2022-02-26 NOTE — Telephone Encounter (Signed)
-----   Message from Elsie Stain, MD sent at 02/23/2022  6:52 AM EDT ----- Let pt know urine is clear  no UTI

## 2022-02-26 NOTE — Telephone Encounter (Signed)
Requested Prescriptions  Pending Prescriptions Disp Refills  . furosemide (LASIX) 20 MG tablet 30 tablet 0    Sig: Take 1 tablet (20 mg total) by mouth daily as needed for edema. For swelling     Cardiovascular:  Diuretics - Loop Failed - 02/26/2022  4:32 PM      Failed - Cr in normal range and within 180 days    Creat  Date Value Ref Range Status  06/20/2016 1.40 (H) 0.60 - 0.93 mg/dL Final    Comment:      For patients > or = 80 years of age: The upper reference limit for Creatinine is approximately 13% higher for people identified as African-American.      Creatinine, Ser  Date Value Ref Range Status  01/09/2022 1.76 (H) 0.57 - 1.00 mg/dL Final   Creatinine, POC  Date Value Ref Range Status  01/01/2017 200 mg/dL Final   Creatinine, Urine  Date Value Ref Range Status  10/28/2015 43 20 - 320 mg/dL Final         Failed - Mg Level in normal range and within 180 days    No results found for: "MG"       Passed - K in normal range and within 180 days    Potassium  Date Value Ref Range Status  01/09/2022 4.2 3.5 - 5.2 mmol/L Final         Passed - Ca in normal range and within 180 days    Calcium  Date Value Ref Range Status  01/09/2022 9.4 8.7 - 10.3 mg/dL Final   Calcium, Ion  Date Value Ref Range Status  01/05/2014 1.12 (L) 1.13 - 1.30 mmol/L Final         Passed - Na in normal range and within 180 days    Sodium  Date Value Ref Range Status  01/09/2022 137 134 - 144 mmol/L Final         Passed - Cl in normal range and within 180 days    Chloride  Date Value Ref Range Status  01/09/2022 101 96 - 106 mmol/L Final         Passed - Last BP in normal range    BP Readings from Last 1 Encounters:  01/09/22 124/68         Passed - Valid encounter within last 6 months    Recent Outpatient Visits          1 month ago Type 2 diabetes mellitus with stage 2 chronic kidney disease, without long-term current use of insulin ([place])   [place] Elsie Stain, MD   3 months ago Type 2 diabetes mellitus with neurological manifestations [facility])   [place] Elsie Stain, MD   5 months ago Fall, subsequent encounter   [place], [place], [place]   6 months ago Type 2 diabetes mellitus with neurological manifestations [facility])   [place] Elsie Stain, MD   8 months ago Fall, initial encounter   [place], [place], [place]      Future Appointments            In 2 months Elsie Stain, MD [place]           Signed Prescriptions Disp Refills   clobetasol cream (TEMOVATE) 0.05 % 30 g 0  Sig: Apply 1 Application topically 2 (two) times daily.     There is no refill protocol information for this order

## 2022-02-26 NOTE — Telephone Encounter (Signed)
-----   Message from Erie Noe, RN sent at 02/26/2022  4:38 PM EDT ----- Pt given lab results per notes of Dr. Joya Gaskins on 02/26/22. Pt verbalized understanding. Pt states that for some reason her feet have been swelling and its been about 5 days with elevating them but slowly coming down. I asked pt if she had any furosemide she could take and she said she thought she was out of that and asked if I could refill. Refilled per protocol and advised her if didn't get nay better to call back in a few days.

## 2022-02-28 ENCOUNTER — Other Ambulatory Visit: Payer: Self-pay

## 2022-03-09 ENCOUNTER — Other Ambulatory Visit: Payer: Self-pay

## 2022-03-16 ENCOUNTER — Other Ambulatory Visit: Payer: Self-pay | Admitting: Critical Care Medicine

## 2022-03-16 ENCOUNTER — Other Ambulatory Visit: Payer: Self-pay

## 2022-03-18 MED ORDER — TRAMADOL HCL 50 MG PO TABS
ORAL_TABLET | ORAL | 0 refills | Status: DC
  Filled 2022-03-18 – 2022-03-26 (×2): qty 90, 30d supply, fill #0

## 2022-03-19 ENCOUNTER — Other Ambulatory Visit: Payer: Self-pay

## 2022-03-23 ENCOUNTER — Other Ambulatory Visit: Payer: Self-pay

## 2022-03-26 ENCOUNTER — Other Ambulatory Visit: Payer: Self-pay

## 2022-04-20 ENCOUNTER — Other Ambulatory Visit: Payer: Self-pay

## 2022-04-20 ENCOUNTER — Other Ambulatory Visit: Payer: Self-pay | Admitting: Critical Care Medicine

## 2022-04-23 MED ORDER — TRAMADOL HCL 50 MG PO TABS
50.0000 mg | ORAL_TABLET | Freq: Three times a day (TID) | ORAL | 0 refills | Status: AC | PRN
  Filled 2022-04-23: qty 90, 30d supply, fill #0

## 2022-04-24 ENCOUNTER — Other Ambulatory Visit: Payer: Self-pay

## 2022-04-26 ENCOUNTER — Other Ambulatory Visit: Payer: Self-pay

## 2022-05-01 ENCOUNTER — Other Ambulatory Visit: Payer: Self-pay

## 2022-05-10 DIAGNOSIS — R404 Transient alteration of awareness: Secondary | ICD-10-CM | POA: Diagnosis not present

## 2022-05-10 DIAGNOSIS — I469 Cardiac arrest, cause unspecified: Secondary | ICD-10-CM | POA: Diagnosis not present

## 2022-05-14 ENCOUNTER — Ambulatory Visit: Payer: Medicare Other | Admitting: Critical Care Medicine

## 2022-05-20 DIAGNOSIS — 419620001 Death: Secondary | SNOMED CT | POA: Diagnosis not present

## 2022-05-20 DEATH — deceased

## 2022-07-19 NOTE — Telephone Encounter (Signed)
complete

## 2022-08-14 ENCOUNTER — Other Ambulatory Visit: Payer: Self-pay
